# Patient Record
Sex: Male | Born: 1935 | Race: White | Hispanic: No | Marital: Married | Smoking: Former smoker
Health system: Southern US, Community
[De-identification: ages and names within clinical notes are randomized; demographics above are authoritative.]

## PROBLEM LIST (undated history)

## (undated) DIAGNOSIS — I2510 Atherosclerotic heart disease of native coronary artery without angina pectoris: Secondary | ICD-10-CM

## (undated) DIAGNOSIS — F329 Major depressive disorder, single episode, unspecified: Secondary | ICD-10-CM

## (undated) DIAGNOSIS — Z9989 Dependence on other enabling machines and devices: Secondary | ICD-10-CM

## (undated) DIAGNOSIS — M1990 Unspecified osteoarthritis, unspecified site: Secondary | ICD-10-CM

## (undated) DIAGNOSIS — F32A Depression, unspecified: Secondary | ICD-10-CM

## (undated) DIAGNOSIS — J189 Pneumonia, unspecified organism: Secondary | ICD-10-CM

## (undated) DIAGNOSIS — I4892 Unspecified atrial flutter: Secondary | ICD-10-CM

## (undated) DIAGNOSIS — Z9289 Personal history of other medical treatment: Secondary | ICD-10-CM

## (undated) DIAGNOSIS — C679 Malignant neoplasm of bladder, unspecified: Secondary | ICD-10-CM

## (undated) DIAGNOSIS — E785 Hyperlipidemia, unspecified: Secondary | ICD-10-CM

## (undated) DIAGNOSIS — G4733 Obstructive sleep apnea (adult) (pediatric): Secondary | ICD-10-CM

## (undated) HISTORY — DX: Malignant neoplasm of bladder, unspecified: C67.9

## (undated) HISTORY — DX: Hyperlipidemia, unspecified: E78.5

## (undated) HISTORY — DX: Depression, unspecified: F32.A

## (undated) HISTORY — DX: Major depressive disorder, single episode, unspecified: F32.9

## (undated) HISTORY — PX: CARPAL TUNNEL RELEASE: SHX101

## (undated) HISTORY — DX: Unspecified atrial flutter: I48.92

## (undated) HISTORY — DX: Atherosclerotic heart disease of native coronary artery without angina pectoris: I25.10

## (undated) HISTORY — PX: TRANSURETHRAL RESECTION OF BLADDER TUMOR: SHX2575

---
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
## 1998-05-25 ENCOUNTER — Ambulatory Visit (HOSPITAL_BASED_OUTPATIENT_CLINIC_OR_DEPARTMENT_OTHER): Admission: RE | Admit: 1998-05-25 | Discharge: 1998-05-25 | Payer: Self-pay | Admitting: Urology

## 1999-03-12 ENCOUNTER — Ambulatory Visit (HOSPITAL_BASED_OUTPATIENT_CLINIC_OR_DEPARTMENT_OTHER): Admission: RE | Admit: 1999-03-12 | Discharge: 1999-03-12 | Payer: Self-pay | Admitting: Urology

## 2000-04-01 ENCOUNTER — Encounter (INDEPENDENT_AMBULATORY_CARE_PROVIDER_SITE_OTHER): Payer: Self-pay | Admitting: Specialist

## 2000-04-01 ENCOUNTER — Ambulatory Visit (HOSPITAL_COMMUNITY): Admission: RE | Admit: 2000-04-01 | Discharge: 2000-04-01 | Payer: Self-pay | Admitting: Urology

## 2001-09-30 ENCOUNTER — Ambulatory Visit (HOSPITAL_COMMUNITY): Admission: RE | Admit: 2001-09-30 | Discharge: 2001-09-30 | Payer: Self-pay | Admitting: *Deleted

## 2001-09-30 ENCOUNTER — Encounter (INDEPENDENT_AMBULATORY_CARE_PROVIDER_SITE_OTHER): Payer: Self-pay | Admitting: Specialist

## 2002-01-10 ENCOUNTER — Ambulatory Visit (HOSPITAL_COMMUNITY): Admission: RE | Admit: 2002-01-10 | Discharge: 2002-01-10 | Payer: Self-pay | Admitting: *Deleted

## 2002-07-14 ENCOUNTER — Encounter: Payer: Self-pay | Admitting: Urology

## 2002-07-19 ENCOUNTER — Encounter (INDEPENDENT_AMBULATORY_CARE_PROVIDER_SITE_OTHER): Payer: Self-pay

## 2002-07-19 ENCOUNTER — Observation Stay (HOSPITAL_COMMUNITY): Admission: RE | Admit: 2002-07-19 | Discharge: 2002-07-20 | Payer: Self-pay | Admitting: Urology

## 2002-08-05 ENCOUNTER — Encounter (INDEPENDENT_AMBULATORY_CARE_PROVIDER_SITE_OTHER): Payer: Self-pay | Admitting: Specialist

## 2002-08-05 ENCOUNTER — Encounter: Payer: Self-pay | Admitting: Urology

## 2002-08-05 ENCOUNTER — Ambulatory Visit (HOSPITAL_COMMUNITY): Admission: RE | Admit: 2002-08-05 | Discharge: 2002-08-05 | Payer: Self-pay | Admitting: Urology

## 2002-11-15 ENCOUNTER — Encounter (INDEPENDENT_AMBULATORY_CARE_PROVIDER_SITE_OTHER): Payer: Self-pay

## 2002-11-15 ENCOUNTER — Ambulatory Visit (HOSPITAL_BASED_OUTPATIENT_CLINIC_OR_DEPARTMENT_OTHER): Admission: RE | Admit: 2002-11-15 | Discharge: 2002-11-15 | Payer: Self-pay | Admitting: Urology

## 2003-01-15 ENCOUNTER — Emergency Department (HOSPITAL_COMMUNITY): Admission: EM | Admit: 2003-01-15 | Discharge: 2003-01-15 | Payer: Self-pay | Admitting: Emergency Medicine

## 2003-01-15 ENCOUNTER — Encounter: Payer: Self-pay | Admitting: Emergency Medicine

## 2003-07-18 ENCOUNTER — Ambulatory Visit (HOSPITAL_COMMUNITY): Admission: RE | Admit: 2003-07-18 | Discharge: 2003-07-18 | Payer: Self-pay | Admitting: Urology

## 2003-07-18 ENCOUNTER — Ambulatory Visit (HOSPITAL_BASED_OUTPATIENT_CLINIC_OR_DEPARTMENT_OTHER): Admission: RE | Admit: 2003-07-18 | Discharge: 2003-07-18 | Payer: Self-pay | Admitting: Urology

## 2003-07-18 ENCOUNTER — Encounter (INDEPENDENT_AMBULATORY_CARE_PROVIDER_SITE_OTHER): Payer: Self-pay

## 2003-10-25 ENCOUNTER — Encounter (INDEPENDENT_AMBULATORY_CARE_PROVIDER_SITE_OTHER): Payer: Self-pay | Admitting: Specialist

## 2003-10-25 ENCOUNTER — Ambulatory Visit (HOSPITAL_BASED_OUTPATIENT_CLINIC_OR_DEPARTMENT_OTHER): Admission: RE | Admit: 2003-10-25 | Discharge: 2003-10-25 | Payer: Self-pay | Admitting: Urology

## 2003-10-25 ENCOUNTER — Ambulatory Visit (HOSPITAL_COMMUNITY): Admission: RE | Admit: 2003-10-25 | Discharge: 2003-10-25 | Payer: Self-pay | Admitting: Urology

## 2004-07-31 ENCOUNTER — Ambulatory Visit (HOSPITAL_BASED_OUTPATIENT_CLINIC_OR_DEPARTMENT_OTHER): Admission: RE | Admit: 2004-07-31 | Discharge: 2004-07-31 | Payer: Self-pay | Admitting: Urology

## 2006-01-09 ENCOUNTER — Ambulatory Visit (HOSPITAL_BASED_OUTPATIENT_CLINIC_OR_DEPARTMENT_OTHER): Admission: RE | Admit: 2006-01-09 | Discharge: 2006-01-09 | Payer: Self-pay | Admitting: Urology

## 2007-01-22 ENCOUNTER — Encounter (INDEPENDENT_AMBULATORY_CARE_PROVIDER_SITE_OTHER): Payer: Self-pay | Admitting: *Deleted

## 2007-01-22 ENCOUNTER — Ambulatory Visit (HOSPITAL_COMMUNITY): Admission: RE | Admit: 2007-01-22 | Discharge: 2007-01-22 | Payer: Self-pay | Admitting: *Deleted

## 2009-03-31 ENCOUNTER — Emergency Department (HOSPITAL_COMMUNITY): Admission: EM | Admit: 2009-03-31 | Discharge: 2009-03-31 | Payer: Self-pay | Admitting: Emergency Medicine

## 2009-05-09 ENCOUNTER — Ambulatory Visit (HOSPITAL_COMMUNITY): Admission: RE | Admit: 2009-05-09 | Discharge: 2009-05-09 | Payer: Self-pay | Admitting: Cardiovascular Disease

## 2009-05-11 DIAGNOSIS — I4892 Unspecified atrial flutter: Secondary | ICD-10-CM

## 2009-05-11 HISTORY — PX: CARDIOVERSION: SHX1299

## 2009-05-11 HISTORY — DX: Unspecified atrial flutter: I48.92

## 2010-02-15 ENCOUNTER — Encounter: Admission: RE | Admit: 2010-02-15 | Discharge: 2010-02-15 | Payer: Self-pay | Admitting: Emergency Medicine

## 2010-03-05 ENCOUNTER — Other Ambulatory Visit: Admission: RE | Admit: 2010-03-05 | Discharge: 2010-03-05 | Payer: Self-pay | Admitting: Interventional Radiology

## 2010-03-05 ENCOUNTER — Encounter: Admission: RE | Admit: 2010-03-05 | Discharge: 2010-03-05 | Payer: Self-pay | Admitting: Emergency Medicine

## 2010-06-26 ENCOUNTER — Ambulatory Visit: Payer: Self-pay | Admitting: Cardiovascular Disease

## 2010-08-11 HISTORY — PX: ATRIAL FLUTTER ABLATION: SHX5733

## 2010-10-31 ENCOUNTER — Other Ambulatory Visit: Payer: Self-pay | Admitting: Dermatology

## 2010-11-16 LAB — APTT: aPTT: 28 seconds (ref 24–37)

## 2010-11-16 LAB — DIFFERENTIAL
Lymphocytes Relative: 19 % (ref 12–46)
Lymphs Abs: 1.6 10*3/uL (ref 0.7–4.0)
Monocytes Relative: 11 % (ref 3–12)
Neutro Abs: 5.6 10*3/uL (ref 1.7–7.7)
Neutrophils Relative %: 68 % (ref 43–77)

## 2010-11-16 LAB — COMPREHENSIVE METABOLIC PANEL
BUN: 14 mg/dL (ref 6–23)
CO2: 25 mEq/L (ref 19–32)
Calcium: 8.7 mg/dL (ref 8.4–10.5)
Creatinine, Ser: 1 mg/dL (ref 0.4–1.5)
GFR calc non Af Amer: >60 mL/min (ref >60)
Glucose, Bld: 115 mg/dL — ABNORMAL HIGH (ref 70–99)
Sodium: 136 mEq/L (ref 135–145)
Total Protein: 7.4 g/dL (ref 6.0–8.3)

## 2010-11-16 LAB — CBC
HCT: 43.9 % (ref 39.0–52.0)
Hemoglobin: 15 g/dL (ref 13.0–17.0)
MCHC: 34.2 g/dL (ref 30.0–36.0)
MCV: 93.7 fL (ref 78.0–100.0)
RDW: 14.3 % (ref 11.5–15.5)

## 2010-11-16 LAB — PROTIME-INR
INR: 1 (ref 0.00–1.49)
Prothrombin Time: 13 seconds (ref 11.6–15.2)

## 2010-11-16 LAB — POCT CARDIAC MARKERS
CKMB, poc: 1.1 ng/mL (ref 1.0–8.0)
Myoglobin, poc: 121 ng/mL (ref 12–200)

## 2010-11-29 ENCOUNTER — Encounter: Payer: Self-pay | Admitting: Orthopedic Surgery

## 2010-12-06 ENCOUNTER — Encounter: Payer: Self-pay | Admitting: Cardiovascular Disease

## 2010-12-06 ENCOUNTER — Encounter: Payer: Self-pay | Admitting: *Deleted

## 2010-12-06 DIAGNOSIS — G4730 Sleep apnea, unspecified: Secondary | ICD-10-CM | POA: Insufficient documentation

## 2010-12-06 DIAGNOSIS — F329 Major depressive disorder, single episode, unspecified: Secondary | ICD-10-CM | POA: Insufficient documentation

## 2010-12-06 DIAGNOSIS — I4892 Unspecified atrial flutter: Secondary | ICD-10-CM | POA: Insufficient documentation

## 2010-12-06 DIAGNOSIS — C679 Malignant neoplasm of bladder, unspecified: Secondary | ICD-10-CM | POA: Insufficient documentation

## 2010-12-12 ENCOUNTER — Ambulatory Visit (INDEPENDENT_AMBULATORY_CARE_PROVIDER_SITE_OTHER): Payer: Medicare Other | Admitting: Cardiovascular Disease

## 2010-12-12 ENCOUNTER — Encounter: Payer: Self-pay | Admitting: Cardiovascular Disease

## 2010-12-12 VITALS — BP 102/62 | HR 74 | Ht 69.0 in | Wt 224.0 lb

## 2010-12-12 DIAGNOSIS — I4892 Unspecified atrial flutter: Secondary | ICD-10-CM

## 2010-12-12 MED ORDER — RIVAROXABAN 20 MG PO TABS: 20.0000 mg | ORAL_TABLET | Freq: Every day | ORAL | Status: DC

## 2010-12-12 NOTE — Assessment & Plan Note (Signed)
James [name] presents with atrial flutter with a controlled ventricular response. I've offered to send him to one of the electrophysiologists for atrial flutter ablation. We'll start him on Xarelto 20 mg a day. I anticipate that he will need a transesophageal echo prior to his atrial flutter ablation. I'll see him back in the office in 3 months.

## 2010-12-12 NOTE — Progress Notes (Signed)
James [name] Date of Birth  02-25-1936 [facility] [address]     [address] [place], [place]  [address] [phone]  Fax  [phone]  History of Present Illness:  Hx of A-Flutter - was cardioverted in the past. Had recent Left wrist surgery and he was found to be in A-Flutter during the surgery. No dyspnea, no syncope, no chest pain.     Current Outpatient Prescriptions on File Prior to Visit  Medication Sig Dispense Refill  . aspirin 81 MG tablet Take 81 mg by mouth daily.        . fish oil-omega-3 fatty acids 1000 MG capsule Take by mouth daily.        . Flaxseed, Linseed, (FLAX SEEDS PO) Take by mouth daily.        . magnesium oxide (MAG-OX) 400 MG tablet Take 50 mg by mouth daily.        . metoprolol succinate (TOPROL-XL) 25 MG 24 hr tablet Take 12.5 mg by mouth daily.        . Multiple Vitamin (MULTIVITAMIN) tablet Take 1 tablet by mouth daily.        . Niacin (VITAMIN B-3 PO) Take by mouth daily.        . sertraline (ZOLOFT) 100 MG tablet Take 100 mg by mouth daily.        Marland Kitchen BILBERRY, VACCINIUM MYRTILLUS, PO Take by mouth daily.          Allergies  Allergen Reactions  . Flomax (Tamsulosin Hcl)   . Protonix     Past Medical History  Diagnosis Date  . Atrial flutter 05/2009    STATUS POST CARDIOVERSION   . Bladder cancer   . Sleep apnea   . Depression     History reviewed. No pertinent past surgical history.  History  Smoking status  . Former Smoker  . Quit date: 08/11/1981  Smokeless tobacco  . Not on file    History  Alcohol Use No    History reviewed. No pertinent family history.  Reviw of Systems:  Reviewed in the HPI.  All other systems are negative.  Physical Exam: BP 102/62  Pulse 74  Ht 5\' 9"  (1.753 m)  Wt 224 lb (101.606 kg)  BMI 33.08 kg/m2 The patient is alert and oriented x 3.  The mood and affect are normal.  The skin is warm and dry.  Color is normal.  The HEENT exam reveals that the  sclera are nonicteric.  The mucous membranes are moist.  The carotids are 2+ without bruits.  There is no thyromegaly.  There is no JVD.  The lungs are clear.  The chest wall is non tender.  The heart exam reveals a regular rate with a normal S1 and S2.  There are no murmurs, gallops, or rubs.  The PMI is not displaced.   Abdominal exam reveals good bowel sounds.  There is no guarding or rebound.  There is no hepatosplenomegaly or tenderness.  There are no masses.  Exam of the legs reveal no clubbing, cyanosis, or edema.  The legs are without rashes.  The distal pulses are intact.  Cranial nerves II - XII are intact.  Motor and sensory functions are intact.  The gait is normal.  ECG: Atrial flutter with a controlled ventricular response at 75 beats per minute  Assessment / Plan:

## 2010-12-23 ENCOUNTER — Encounter: Payer: Self-pay | Admitting: *Deleted

## 2010-12-24 NOTE — Op Note (Signed)
[name], James NO.:  [id number]   MEDICAL RECORD NO.:  [id number]          PATIENT TYPE:  AMB   LOCATION:  ENDO                         FACILITY:  [facility]   PHYSICIAN:  Georgiana Spinner, M.D.    DATE OF BIRTH:  1936-03-25   DATE OF PROCEDURE:  01/22/2007  DATE OF DISCHARGE:                               OPERATIVE REPORT   PROCEDURE:  Colonoscopy.   INDICATIONS:  Colon polyps removed in the past.   ANESTHESIA:  Fentanyl 30 mcg, Versed 3 mg.   PROCEDURE:  With the patient mildly sedated in the left lateral  decubitus position, a rectal examination was performed which was  unremarkable to my exam.  Subsequently the Pentax videoscopic  colonoscope was inserted in the rectum, passed through diverticular  filled sigmoid colon and with pressure applied.  The patient rolled to  his back and subsequently to his right side and back to his back.  We  were able to reach the cecum as identified by ileocecal valve, base of  the cecum and appendiceal orifice, all of which were photographed.  After clearing the cecum, the colonoscope was slowly withdrawn taking  circumferential views of colonic mucosa stopping only to photograph  diverticula seen along the way, both in the right and left colon  moderately severe until we reached the rectum where two small polyps  were seen on direct view.  There were removed using hot biopsy forceps  technique setting of 20/200 blended current.  We then placed the  colonoscope on retroflex view to view the anal canal from above, the  endoscope was straightened and withdrawn.  The patient's vital signs,  pulse oximeter remained stable.  The patient tolerated procedure well  without apparent complications.   FINDINGS:  Two small polyps of rectum, diverticulosis, moderately severe  scattered throughout the right and left colon.  Internal hemorrhoids.   PLAN:  Await biopsy report.  The patient will call me for results and  follow-up with me  as needed as an outpatient.           ______________________________  Georgiana Spinner, M.D.     GMO/MEDQ  D:  01/22/2007  T:  01/22/2007  Job:  [id number]

## 2010-12-24 NOTE — Op Note (Signed)
NAMELAYDON, MARTIS NO.:  [id number]   MEDICAL RECORD NO.:  [id number]          PATIENT TYPE:  AMB   LOCATION:  ENDO                         FACILITY:  [facility]   PHYSICIAN:  Georgiana Spinner, M.D.    DATE OF BIRTH:  03-16-1936   DATE OF PROCEDURE:  01/22/2007  DATE OF DISCHARGE:                               OPERATIVE REPORT   PROCEDURE:  Upper endoscopy.   INDICATIONS:  GERD, Rule out Barrett's esophagus.   ANESTHESIA:  Fentanyl 50 mcg, Versed 7 mg.   PROCEDURE:  With the patient mildly sedated in the left lateral  decubitus position, the Pentax videoscopic endoscope was inserted in the  mouth, passed under direct vision through the esophagus, which appeared  normal, until we reached distal esophagus and we got a good view of the  squamocolumnar junction and I could see no clear-cut evidence of  Barrett's esophagus; however, this area was somewhat erythematous  consistent with a localized esophagitis, which I photographed and  biopsied.  We entered into the stomach.  Fundus, body, antrum, duodenal  bulb, second portion of the duodenum all appeared normal.  From this  point the endoscope was slowly withdrawn taking circumferential views of  the duodenal mucosa until the endoscope was pulled back into the  stomach, placed in retroflexion to view the stomach from below.  The  endoscope was straightened and withdrawn taking circumferential views of  the remaining gastric and esophageal mucosa.  The patient's vital signs  and pulse oximeter remained stable.  The patient tolerated the procedure  well without apparent complications.   FINDINGS:  Localized esophagitis near the squamocolumnar junction,  biopsied and photographed.   Await biopsy report.  The patient will call me for results and follow up  with me as needed as an outpatient.  Proceed to colonoscopy.           ______________________________  Georgiana Spinner, M.D.     GMO/MEDQ  D:  01/22/2007  T:   01/22/2007  Job:  [id number]

## 2010-12-25 ENCOUNTER — Ambulatory Visit (INDEPENDENT_AMBULATORY_CARE_PROVIDER_SITE_OTHER): Payer: Medicare Other | Admitting: Internal Medicine

## 2010-12-25 ENCOUNTER — Encounter: Payer: Self-pay | Admitting: *Deleted

## 2010-12-25 ENCOUNTER — Encounter: Payer: Self-pay | Admitting: Internal Medicine

## 2010-12-25 VITALS — BP 102/64 | HR 76 | Resp 14 | Ht 69.0 in | Wt 222.0 lb

## 2010-12-25 DIAGNOSIS — I4892 Unspecified atrial flutter: Secondary | ICD-10-CM

## 2010-12-25 NOTE — Patient Instructions (Addendum)
Your physician has recommended that you have an ablation. Catheter ablation is a medical procedure used to treat some cardiac arrhythmias (irregular heartbeats). During catheter ablation, a long, thin, flexible tube is put into a blood vessel in your groin (upper thigh), or neck. This tube is called an ablation catheter. It is then guided to your heart through the blood vessel. Radio frequency waves destroy small areas of heart tissue where abnormal heartbeats may cause an arrhythmia to start. Please see the instruction sheet given to you today.  Your physician has requested that you have an echocardiogram. Echocardiography is a painless test that uses sound waves to create images of your heart. It provides your doctor with information about the size and shape of your heart and how well your heart's chambers and valves are working. This procedure takes approximately one hour. There are no restrictions for this procedure.  Your physician recommends that you return for lab work on 01/13/11: bmp/cbc/pt (427.32)  Your physician has recommended you make the following change in your medication: Stop Aspirin.

## 2010-12-25 NOTE — Assessment & Plan Note (Signed)
The patient has atrial flutter both typical as well as atypical likely representing reverse typical. We have discussed potential risks and benefits and alternatives to catheter ablation. His CHADS VASC score is one and so he would not need anticoagulation until he were 77 and the absence of a anticipated procedure. He was like to proceed with catheter ablation to reduce his risk of stroke and improve his fatigue. We have discussed potential risks and benefits.  We will plan to undertake an echo to look at left ventricular function and valvular structures. He is now 10 days into his anticoagulation and will anticipate his ablation in about 2 weeks' time.  We will discontinue his aspirin at this time as we don't know whether it adds or detracts from benefits of James [name]

## 2010-12-25 NOTE — Progress Notes (Signed)
History of present illness  James [name] is seen at the request of [name] because of recurrent atrial flutter.  He is a 75 year old who is largely healthy. He ended up being found to have atrial flutter 2 years ago. We'll echocardiographic review demonstrated typical atrial flutter and he underwent cardioversion.  He went to the wrist surgery earlier this spring. He was found to be in atrial flutter. Heart the cardiogram demonstrated atypical flutter. He was referred to [name]. Pradaxa was initiated and he is referred for consideration of ablation. : in the interim his flutter had become typical.  Thromboembolic risk factors are notable for age-[age]; his CHADS VASC score is one.  Following his cardioversion 2 years ago he noted improvement in fatigue and exercise tolerance. He has had no other significant symptoms that he can attribute to the presence of the atrial flutter.  Cardiac evaluation has not been performed recently.     Past Medical History  Diagnosis Date  . Atrial flutter 05/2009    STATUS POST CARDIOVERSION   . Bladder cancer   . Sleep apnea   . Depression     Past Surgical History  Procedure Date  . Carpal tunnel release   . Bladder surgery     Current Outpatient Prescriptions  Medication Sig Dispense Refill  . BILBERRY, VACCINIUM MYRTILLUS, PO Take by mouth daily.        Marland Kitchen CINNAMON PO Take by mouth daily.        . fish oil-omega-3 fatty acids 1000 MG capsule Take by mouth daily.        . Flaxseed, Linseed, (FLAX SEEDS PO) Take by mouth daily.        . magnesium oxide (MAG-OX) 400 MG tablet Take 50 mg by mouth daily.        . metoprolol succinate (TOPROL-XL) 25 MG 24 hr tablet Take 12.5 mg by mouth daily.        . Multiple Vitamin (MULTIVITAMIN) tablet Take 1 tablet by mouth daily.        . NON FORMULARY Vitamin B and C  1 tab po qd       . Rivaroxaban (XARELTO) 20 MG TABS Take 20 mg by mouth daily.  30 tablet  12  . sertraline (ZOLOFT) 100 MG tablet Take  100 mg by mouth daily.        . Turmeric POWD by Does not apply route daily.        Marland Kitchen DISCONTD: aspirin 81 MG tablet Take 81 mg by mouth daily.        Marland Kitchen DISCONTD: Niacin (VITAMIN B-3 PO) Take by mouth daily.          Allergies  Allergen Reactions  . Flomax (Tamsulosin Hcl)   . Protonix     Review of Systems negative except from HPI and PMH involvement in Alcoholics Anonymous, obesity and overeaters anonymous for which he has lost 30-35 pounds constitutional symptoms are negative.  Social history he is married with children and grandchildren and has a 32 year old adopted [place] daughter. He does not use cigarettes. He does not use alcohol or recreational drugs. He walks some exercise. 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  Physical Exam Well developed and well nourished Caucasian male appearing his stated age in no acute distress HENT normal E scleral and icterus clear Neck Supple JVP flat; carotids brisk and full Clear to ausculation Regular rate and rhythm, no murmurs gallops or rub Soft with active bowel sounds No  clubbing cyanosis and edema Alert and oriented, grossly normal motor and sensory function Skin Warm and Dry  ECG typical atrial flutter at a rate of 74.  Review cardiograms throughout the medical record demonstrates atypical atrial flutter with a similar atrial cycle length was upright flutter waves in the inferior leads and negative for release in lead V1 Assessment and  Plan

## 2010-12-27 NOTE — Procedures (Signed)
[facility]  Patient:    MUMIN, DENOMME Visit Number: [id number] MRN: [id number]          Service Type: END Location: ENDO Attending Physician:  Sabino Gasser Dictated by:   Sabino Gasser, M.D. Proc. Date: 01/10/02 Admit Date:  01/10/2002 Discharge Date: 01/10/2002                             Procedure Report  PROCEDURE:  Upper endoscopy.  INDICATION:  Follow-up of gastric ulcer with bleeding.  ANESTHESIA:  Demerol 50, Versed 5 mg.  DESCRIPTION OF PROCEDURE:  With the patient mildly sedated in the left lateral decubitus position, the Olympus videoscopic endoscope was inserted into the mouth and passed under direct vision through the esophagus, which appeared normal, into the stomach.  The fundus, body, antrum, duodenal bulb, and second portion of duodenum all appeared normal.  From this point, the endoscope was slowly withdrawn, taking circumferential views of the entire duodenal mucosa until the endoscope then pulled back into the stomach and placed in retroflexion to view the stomach from below.  The endoscope was then straightened and withdrawn, taking circumferential views of the entire gastric and esophageal mucosa.  The patients vital signs and pulse oximeter remained stable.  The patient tolerated the procedure well without apparent complications.  FINDINGS: 1. Some fluid retained in stomach. 2. Otherwise an unremarkable examination. 3. No evidence of Barretts.  No recurrence of ulcer.  PLAN:  Continue proton pump inhibitor as long as patient is on an NSAID. Dictated by:   Sabino Gasser, M.D. Attending Physician:  Sabino Gasser DD:  01/10/02 TD:  01/10/02 Job: [id number]

## 2010-12-27 NOTE — Op Note (Signed)
[facility]  Patient:    James [name], James [name]                         MRN: [id number] Proc. Date: 04/01/00 Adm. Date:  [id number] Attending:  Nelma Rothman Iii                           Operative Report  PREOPERATIVE DIAGNOSIS:  Bladder lesions.  OPERATIVE PROCEDURE:  Cystourethroscopy and cold cup removal of two bladder lesions.  SURGEON:  Lucrezia Starch. Ovidio Hanger, M.D.  ANESTHESIA:  General endotracheal.  ESTIMATED BLOOD LOSS:  Negligible.  TUBES:  None.  COMPLICATIONS:  None.  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 He has had intravesical immunotherapy with BCG and a most recent biopsy approximately a year previously with inflammation only.  At follow-up cystourethroscopy at the office, he was found to have a wide caliber deep bulbar urethral stricture but was found to have a ______  lesion in the posterior midline near the trigone and ______  lesion at the opening of the left ureteral orifice.  He understands the risks, benefits, and alternatives. He has elected to proceed with cystourethroscopy, cold cup removal of the lesions.  DESCRIPTION OF PROCEDURE:  The patient was placed in the supine position after proper General endotracheal anesthesia and was placed in the dorsal lithotomy position and prepped and draped with Betadine in a sterile fashion. Cystourethroscopy was performed with a 22.5 [place] ACMI panendoscope utilizing 30 and 70 degree lenses.  The bladder was carefully inspected, and a tiny 2 mm lesion in the posterior midline just proximal to the trigone was identified and removed in its entirety with cold cup biopsy forceps and submitted to pathology, and the base was cauterized with Bovie coagulation cautery.  A 4 French whistle-tip catheter was placed in the left ureteral orifice and tiny areas in the 10 oclock and 11  oclock position at the external orifice were noted and were removed with cold cup biopsy forceps, and point coagulation was performed carefully avoiding the ureteral orifice.  No other lesions were noted to be present.  The bladder was drained.  The panendoscope was removed. The specimens were submitted separately to pathology, and the patient was taken to the recovery room stable.  PLAN:  If the biopsies are positive, follow him up and consider another intravesical immunotherapy course and probably to perform an intravenous pyelogram should the biopsy be positive. DD:  04/01/00 TD:  04/02/00 Job: [id number]

## 2010-12-27 NOTE — Op Note (Signed)
NAME:  James [name], James [name]                            ACCOUNT NO.:  [id number]   MEDICAL RECORD NO.:  [id number]                   PATIENT TYPE:  AMB   LOCATION:  [facility]                                 FACILITY:  [facility]   PHYSICIAN:  Ronald L. Ovidio Hanger, M.D.           DATE OF BIRTH:  [birth year]   DATE OF PROCEDURE:  11/15/2002  DATE OF DISCHARGE:                                 OPERATIVE REPORT   DIAGNOSES:  1. History of transitional cell carcinoma of the bladder.  2. Left ureteral lesion, which was hyperplasia on biopsy.  3. Status post bacille Calmette-Guerin with left ureteral stent in place.   OPERATIVE PROCEDURE:  1. Cystourethroscopy.  2. Ureteroscopy.  3. Left ureteroscopy.  4. Brush biopsy of lower ureter.  5. Replacement of left double-J stent.   SURGEON:  [name], M.D.   ANESTHESIA:  General laryngeal airway.   ESTIMATED BLOOD LOSS:  Negligible.   TUBES:  A 24 cm 7 French Bard double-pigtail stent.   COMPLICATIONS:  None.   INDICATIONS FOR PROCEDURE:  Mr. Fedewa is a very nice 75 year old, white male  with a history of superficial transitional cell carcinoma of the bladder.  He has undergone treatment with intravesical chemotherapy several times  previously and at biopsy was found to have a lower ureteral lesion that was  suspicious for transitional cell carcinoma.  However, on two separate  biopsies, was found to have hyperplasia only.  We were contemplating a left  nephroureterectomy but with the diagnosis of hyperplasia, it was elected  with two separate biopsies to place a left double-J stent and treat him with  BCG intravesically, hoping that it would reflux in the area and would treat  it and preserve the left renal unit and ureter.  After understanding risks.  benefits, and alternatives, he underwent this and now is for follow-up  ureteroscopy, cystoscopy, and biopsy.   PROCEDURE IN DETAIL:  The patient was placed in a supine position.  After  proper  general laryngeal airway anesthesia, was placed in the dorsal  lithotomy position and prepped and draped with Betadine in sterile fashion.  Cystourethroscopy was performed with the 22.5 French Olympus panendoscope,  utilizing the 12 and 70 degree lenses.  The bladder was carefully inspected  and really noted to be without lesions.  Efflux of clear urine was noted  from the normally-placed right ureteral orifice.  The stent was on the left  side, and there some inflammation around the stent, but it appeared really  to be benign inflammation and was very minute.  The stent was grasped with a  grasping forcep, pulled to the meatus and under fluoroscopic guidance, a  .038 French Teflon-coated guidewire was placed into the left kidney, and the  old stent was removed.  Utilizing the short, thin ACMI ureteroscope, the  lower half of the ureter was examined.  There was some inflammation noted in  the lower ureter, but none of the voluminous tissue that was seen previously  appeared to be present.  A brush was visually brushed in the entire lower  third ureter and submitted for cytological evaluation.  Reinspection  revealed again no significant lesions, just some inflammation which may  simply have been stent-generated, and we were quite pleased at the way it  appeared.  Under fluoroscopic guidance, a 24 cm 7 French Bard double-pigtail  stent was placed and noted to be in good position within the left renal  pelvis and within the bladder.  The bladder was drained.  No pull-out string  was attached.  The panendoscope was visually removed, and the patient was  taken to the recovery room stable.  Our plan is to await the biopsies.  If  they show no tumor, we will probably remove the stent in our office at that  time.                                               Ronald L. Ovidio Hanger, M.D.    RLD/MEDQ  D:  11/15/2002  T:  11/15/2002  Job:  [id number]

## 2010-12-27 NOTE — Op Note (Signed)
NAME:  James [name], James [name]                            ACCOUNT NO.:  [id number]   MEDICAL RECORD NO.:  [id number]                   PATIENT TYPE:  AMB   LOCATION:  [facility]                                 FACILITY:  [facility]   PHYSICIAN:  Ronald L. Ovidio Hanger, M.D.           DATE OF BIRTH:  1936-07-03   DATE OF PROCEDURE:  10/25/2003  DATE OF DISCHARGE:                                 OPERATIVE REPORT   DIAGNOSES:  History of transitional cell carcinoma of the bladder and  hyperplasia of the left lower ureter.   OPERATIVE PROCEDURE:  1. Cystourethroscopy.  2. Bladder biopsy.  3. Left ureteroscopy.   SURGEON:  Gaynelle Arabian, M.D.   ANESTHESIA:  LMA.   BLOOD LOSS:  Negligible.   TUBES:  None.   COMPLICATIONS:  None.   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  He has undergone BCG and Intron A therapies.  He has also had what  was felt to be hyperplasia of low malignant potential in the left lower  ureter and has had one course of BCG, Intron A with a ureteral stent in  place.  He comes in basically for a follow-up biopsy.  He has had no  hematuria or other problems.   PROCEDURE IN DETAIL:  The patient was placed in a supine position.  After  proper LMA anesthesia, was placed in the dorsal lithotomy position and  prepped and draped with Betadine in a sterile fashion.  Cystourethroscopy  was performed with a 22.5 French Olympus panendoscope utilizing the 12 and  70 degree lenses.  The bladder was carefully inspected.  Efflux of clear  urine was noted from the normally placed ureteral orifices bilaterally.  There was mild trilobar hypertrophy, fairly high bladder neck, and really no  lesions noted.  There was slight hyperemia just lateral and slightly  superior to the left ureteral orifice.  Utilizing cold cup forceps, this  area was biopsied and submitted to pathology, and the base  was cauterized  with Bugbee coagulation cautery.  A 0.038 French Teflon-coated guidewire was  placed into the left ureter up to the left renal pelvis, and ureteroscopy  was performed with a Wolf short thin ureteroscope.  The lower two-thirds  ureter was examined and noted to be perfectly clear.  Previous areas of  hyperplasia were noted to have smooth normal-appearing mucosa, and there was  absolutely no area to biopsy.  The ureteroscope was visually removed.  The  bladder was drained.  The guidewire was removed, and the patient was taken  to the recovery room stable.  Ronald L. Ovidio Hanger, M.D.   RLD/MEDQ  D:  10/25/2003  T:  10/25/2003  Job:  [id number]

## 2010-12-27 NOTE — Procedures (Signed)
[facility]  Patient:    James [name], James [name] Visit Number: [id number] MRN: [id number]          Service Type: Attending:  Sabino Gasser, M.D. Dictated by:   Sabino Gasser, M.D. Proc. Date: 09/30/01                             Procedure Report  PROCEDURE:  Upper endoscopy.  INDICATION:  Hemoccult positivity.  ANESTHESIA:  Demerol 70, Versed 7 mg.  DESCRIPTION OF PROCEDURE:  With patient mildly sedated in the left lateral decubitus position, the Olympus videoscopic endoscope was inserted into the mouth and passed under direct vision through the esophagus.  The distal esophagus showed questionable Barretts esophagus versus a flame of just esophagitis.  This was photographed and biopsied.  We entered into the stomach.  The fundus, body, antrum, duodenal bulb, and second portion of duodenum were visualized.  From this point, the endoscope was slowly withdrawn, taking circumferential views of the entire duodenal mucosa until the endoscope then pulled back into the stomach and placed in retroflexion to view the stomach from below.  The endoscope was then straightened and withdrawn, taking circumferential views of the remaining gastric and esophageal mucosa, stopping in the body of the stomach where ulcerations were seen with an eschar on top, indicating that it had bled.  Surrounding tissue was erythematous consistent with a possible gastritis.  This was photographed and biopsied.  The patients vital signs and pulse oximeter remained stable. The patient tolerated the procedure well without apparent complications.  FINDINGS:  Changes of gastritis with ulceration with eschar indicating bleeding.  Additionally, there were some changes of the distal esophagus, biopsies taken.  PLAN:  Await biopsy report.  The patient will call me for results and follow up with me as an outpatient.  Proceed to colonoscopy. Dictated by:   Sabino Gasser, M.D. Attending:  Sabino Gasser,  M.D. DD:  09/30/01 TD:  09/30/01 Job: [id number]

## 2010-12-27 NOTE — Op Note (Signed)
NAMELADON, James [name] NO.:  [id number]   MEDICAL RECORD NO.:  [id number]          PATIENT TYPE:  AMB   LOCATION:  [facility]                         FACILITY:  [facility]   PHYSICIAN:  [name], M.D.  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   OPERATION PERFORMED:  Cystourethroscopy and ureteroscopy.   SURGEON:  [name], M.D.   ANESTHESIA:  LMA.   ESTIMATED BLOOD LOSS:  Negligible.   TUBES:  None.   COMPLICATIONS:  None.   INDICATIONS FOR PROCEDURE:  Joie is a very nice 75 year old white male who  has had recurrent transitional cell carcinoma of the bladder.  He also had  tumor in the lower very distal ureter that had low malignant potential.  He  was treated with a stent, BCG in the past and has done well.  He had  negative cystourethroscopy but comes in for lower ureteroscopy to make sure  there is no recurrence at that point.  After understanding risks, benefits  and alternatives, he has elected to proceed.   DESCRIPTION OF PROCEDURE:  The patient was placed in supine position and  after proper LMA anesthesia, was placed in dorsal lithotomy position and  prepped and draped with Betadine in sterile fashion.  Cystourethroscopy was  performed with a 22.5 French Olympus pan endoscope utilizing the 12 and 70  degree lenses.  The bladder was carefully inspected and it had moderate  trilobar hypertrophy.  The bladder was really smooth-walled and efflux of  clear urine was noted from normally placed ureteral orifices bilaterally.  A  0.038 French sensor wire was placed into the left  renal pelvis and  ureteroscopy was performed with a short, thin, semirigid ureteroscope.  The  lower two thirds  ureter was inspected and noted to be totally clear and there was really  nothing felt needed to be biopsied.  The ureteroscope was visually removed,  the wire was removed, the bladder was drained.  Panendoscope was removed.  The patient was taken to the operating room stable.      [name], M.D.  Electronically Signed     RLD/MEDQ  D:  01/09/2006  T:  01/09/2006  Job:  [id number]

## 2010-12-27 NOTE — Procedures (Signed)
[facility]  Patient:    James [name], James [name] Visit Number: [id number] MRN: [id number]          Service Type: Attending:  Sabino Gasser, M.D. Dictated by:   Sabino Gasser, M.D. Proc. Date: 09/30/01                             Procedure Report  PROCEDURE:  Colonoscopy.  INDICATION:  Hemoccult positivity.  ANESTHESIA:  Demerol 30, Versed 2 extra mg.  DESCRIPTION OF PROCEDURE:  With the patient mildly sedated in the left lateral decubitus position, a rectal exam was performed which was unremarkable. Subsequently, the Olympus videoscopic colonoscope was inserted into the rectum and passed under direct vision to the cecum, identified by the ileocecal valve and the cecal base.  From this point, the colonoscope was slowly withdrawn, taking circumferential views of the entire colonic mucosa, stopping to photograph diverticula seen along the way, both in the right and left colon, until we reached approximately 15 cm from the anal verge, at which point a small polyp was seen and photographed and removed using hot biopsy forceps technique, setting of 20-20 blended current.  The endoscope was withdrawn to the rectum which appeared normal in direct view and showed hemorrhoids on retroflexed view.  The endoscope was straightened and withdrawn.  The patients vital signs and pulse oximeter remained stable.  The patient tolerated the procedure well without apparent complications.  FINDINGS: 1. Diverticulosis scattered throughout the colon, right and left. 2. A small polyp at 15 cm. 3. Internal hemorrhoids were seen.  PLAN:  Await biopsy report.  The patient will call me for results and follow up with me as an outpatient.  See endoscopy note for further details. Dictated by:   Sabino Gasser, M.D. Attending:  Sabino Gasser, M.D. DD:  09/30/01 TD:  09/30/01 Job: [id number]

## 2010-12-27 NOTE — Op Note (Signed)
NAME:  James [name], James [name]                            ACCOUNT NO.:  [id number]   MEDICAL RECORD NO.:  [id number]                   PATIENT TYPE:  AMB   LOCATION:  DAY                                  FACILITY:  [facility]   PHYSICIAN:  Melvyn Novas, M.D.                DATE OF BIRTH:  06/30/1936   DATE OF PROCEDURE:  07/19/2002  DATE OF DISCHARGE:                                 OPERATIVE REPORT   PREOPERATIVE DIAGNOSES:  Bladder tumor.   POSTOPERATIVE DIAGNOSES:  1. Bladder tumor.  2. Left ureteral tumor.   PROCEDURE:  1. Cystoscopy.  2. Transurethral resection of bladder tumor x3, medium.   ANESTHESIA:  General.   SURGEON:  [name], M.D.   THIRD ASSISTANT:  Melvyn Novas, M.D.   ESTIMATED BLOOD LOSS:  Minimal.   SPECIMENS:  Bladder tumor.   DRAINS:  Left double J ureteral stent.   COMPLICATIONS:  None.   FINDINGS:  1. Cystoscopy demonstrated three bladder tumors ranging from 1-2 cm each.     One tumor was about 2 cm and papillary in appearance at the bladder dome.     Another 1 cm papillary tumor appeared on the left lateral wall as well as     a 1-2 cm papillary appearing tumor within the left ureteral orifice.  2. Right retrograde pyelogram was performed and was normal. Left retrograde     pyelogram was also negative for any obvious filling defects, mucosal     abnormalities.  3. Left flexible ureteroscopy. The left side demonstrated multiple papillary     appearing tumors throughout the course of the distal third of the left     ureter.   BRIEF HISTORY:  Mr. Bostwick is a very nice 75 year old white male with a long-  term history of recurrent transitional cell carcinoma who underwent BCG  intravesical immunotherapy in the past. He is status post resection of two  bladder tumors in August of 2001 and also is noted to have a bulbar urethral  stricture at that time. He recently underwent surveillance cystoscopy which  demonstrated tumors consistent with the ones  described above. He presents  today for resection of these tumors as well as evaluation of his upper  tracts.   DESCRIPTION OF PROCEDURE:  Following administration of antibiotics and  anesthesia, Mr. Frigon was prepped and draped in the dorsal lithotomy  position. Standard cystoscopy was performed using 30 degree and 70 degree  lenses. Findings are as above. There were no other mucosal abnormalities.  Following cystoscopy, cold cup biopsy forceps were placed through the  cystoscope sheath and under direct visual guidance, the tumors were removed  using cold cup biopsy forceps. The resection beds were then fulgurated using  a Bugbee electrode. Prior to resection of the tumor at the left ureteral  orifice; however, a 0.038 guidewire was passed up the left ureter under  fluoroscopic and  cystoscopic guidance. This tumor was then removed with the  cold cup biopsy forceps and the base was gently fulgurated using the Bugbee  electrode. A flexible ureteroscope was then passed over the wire under  cystoscopic guidance. We were able to get the flexible ureteroscope up to  the mid ureter but were not able to pass it any more proximal than this. The  wire was therefore removed. __________ was directly visualized at the mid  left ureter with the ureteroscope. There was a slight narrowing of the  ureter at the mid ureter which was the apparent cause of inability to pass  the ureteroscope more proximally. At that time, we injected contrast through  the ureteroscope and performed retrograde pyelography of the kidney and  upper part of the left ureter. There are no mucosal defects. We then  performed distal ureteroscopy by gently pulling the ureteroscope down the  left ureter. Prior to doing so, we placed an additional safety wire up the  left ureter. During flexible ureteroscopy, we noted several lesions within  the distal left ureter as noted above. The ureteroscope was then removed. At  that time, we  turned our attention to the right ureteral orifice and  performed a right sided retrograde pyelogram using an endhole catheter in  the normal fashion. The right upper tract was normal. The cystoscope was  then back loaded over the wire that was in the left ureter and a 6 x 26  double J ureteral stent with a tether was pushed over the wire under  fluoroscopic and cystoscopic guidance such that the proximal end of that  curled in the renal pelvis in the distal end of the bladder. The bladder was  then emptied. The cystoscope was removed. The tether on the stent was taped  to the dorsum of the penis. The procedure was then terminated.   DISPOSITION:  The patient was taken to the recovery room in stable  condition.                                               Melvyn Novas, M.D.    DK/MEDQ  D:  07/19/2002  T:  07/19/2002  Job:  [id number]

## 2010-12-27 NOTE — Op Note (Signed)
NAME:  James [name], James [name]                            ACCOUNT NO.:  [id number]   MEDICAL RECORD NO.:  [id number]                   PATIENT TYPE:  INP   LOCATION:  0001                                 FACILITY:  [facility]   PHYSICIAN:  [name], M.D.               DATE OF BIRTH:  [birth year]   DATE OF PROCEDURE:  08/05/2002  DATE OF DISCHARGE:                                 OPERATIVE REPORT   PREOPERATIVE DIAGNOSIS:  1. History of transitional cell carcinoma of the bladder.  2. Possible left ureteral tumor.   ANESTHESIA:  General.   PROCEDURES:  1. Cystoscopy.  2. Left retrograde pyelography.  3. Left ureteroscopic ureteral biopsies.  4. Left ureteroscopic brush biopsies.  5. Left ureteral stent placement.   SURGEON:  [name], M.D.   ASSISTANT:  Melvyn Novas, M.D.   ESTIMATED BLOOD LOSS:  Minimal.   DRAINS:  Left-sided 7 x 26 double J ureteral stent.   SPECIMENS:  Ureteral biopsy and ureteral brush biopsies.   BRIEF HISTORY:  The patient is a very nice 75 year old black male with a  history of superficial transitional cell carcinoma of the bladder.  Recent  surveillance cystoscopy demonstrated two bladder tumors which were resected,  and the pathology of these returned superficial transitional cell carcinoma.  Additionally he had what appeared to be a TCC emanating from his left  ureteral orifice.  Thorough biopsies were taken of this; however, these  returned with a diagnosis of hyperplasia rather than carcinoma.  Also at  that time,  left-sided ureteroscopy was performed.  There were multiple  papillary lesions in the left distal ureter that appeared to be transitional  cell carcinoma.  At that time that procedure was terminated.  Left  nephroureterectomy was planned for the patient; however, upon evaluation of  the pathologic diagnosis of hyperplasia, we were reticent to remove his  kidney and ureter prior to obtaining a tissue diagnosis from the left  ureter.   Therefore, we opted for ureteroscopic biopsies after discussion  with the patient.  He was taken to the operating room for that procedure  today.   DESCRIPTION OF PROCEDURE:  Following the administration of antibiotics and  anesthesia, the patient was prepped and draped in the dorsal lithotomy  position.  Standard cystoscopy was performed using a 12 degree lens.  The  left ureteral orifice was identified.  There was no remaining mass at the  left ureteral orifice.  There were no obvious remaining tumors in the  bladder.  A 0.038 guidewire was used to cannulate the left ureteral orifice.  We were able to pass this approximately one-third up the ureter without  resistance.  However, approximately one-third to one-half way up the ureter,  some resistance was encountered.  Therefore, an open-ended catheter was  placed over the wire.  Left retrograde pyelogram was performed, which  demonstrated dilated ureter but no  obvious filling defects.  We were able to  then pass a Glidewire through the open-ended catheter up into the left renal  pelvis without difficulty.  The open-ended catheter was then passed over the  Glidewire and the wire was removed and replaced with a 0.038 Teflon-coated  guidewire.  At that time a 4 cm dilating balloon was passed over the wire  through the cystoscope and under fluoroscopic guidance, the distal 3 cm of  the ureter was balloon-dilated without difficulty.  The balloon dilator was  then removed.  The cystoscope was then removed while keeping the wire in  place.  Semirigid ureteroscopy was then performed.  Again there were  multiple papillary-appearing lesions throughout the distal left ureter.  Using flexible biopsy forceps, multiple biopsies were taken of this tissue.  Two separate brushings were then performed of this area.  All these  specimens were sent to pathology.  At that time it was noted that the  guidewire may have been passed through a short submucosal  tunnel prior to re-  entering the lumen of the ureter; therefore, a second guidewire was passed  up the ureter under ureteroscopic guidance without difficulty.  The first  wire was then removed.  The ureteroscope was then removed.  The cystoscope  was back-loaded over the remaining guidewire.  A 6 x 26 double J ureteral  stent was passed up the left ureter under fluoroscopic and cystoscopic  guidance until the proximal coil in the renal pelvis and the distal end in  the bladder.  The bladder was then emptied.  The cystoscope was then  removed.  Proper placement of the stent was again confirmed  fluoroscopically.  The procedure was then terminated.   DISPOSITION:  The patient was taken to the recovery room in stable  condition.      Melvyn Novas, M.D.                      [name], M.D.    DK/MEDQ  D:  08/05/2002  T:  08/05/2002  Job:  [id number]

## 2010-12-27 NOTE — Op Note (Signed)
James [name], James [name] NO.:  [id number]   MEDICAL RECORD NO.:  [id number]          PATIENT TYPE:  AMB   LOCATION:  [facility]                         FACILITY:  [facility]   PHYSICIAN:  [name] III, M.D.DATE OF BIRTH:  September 03, 1935   DATE OF PROCEDURE:  07/31/2004  DATE OF DISCHARGE:                                 OPERATIVE REPORT   PREOPERATIVE DIAGNOSES:  History of transitional cell carcinoma of the  bladder and hyperplasia of the left lower ureter.   POSTOPERATIVE DIAGNOSES:  History of transitional cell carcinoma of the  bladder and hyperplasia of the left lower ureter.   OPERATION:  Cystourethroscopy, left ureteroscopy, diagnostic only.   SURGEON:  [name], M.D.   ANESTHESIA:  LMA.   ESTIMATED BLOOD LOSS:  Negligible.   TUBES:  None.   COMPLICATIONS:  None.   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  He has undergone multiple treatments with TURBT's and BCG who was found to  have the lower third of his ureter covered with a lesion that appeared to be  transitional cell carcinoma but on two separate biopsies were found to be  hyperplasia only. It was elected to proceed with double J stent and BCG for  reflux therapy of the lower ureter. This has been performed in the past and  he has had no evidence of recurrence. He is for cystourethroscopy and left  ureteroscopy for diagnostic purposes. He has had no blood or other problems.   DESCRIPTION OF PROCEDURE:  The patient was placed in supine position and  after proper LMA anesthesia was placed in the dorsal lithotomy position,  prepped and draped with Betadine in a sterile fashion.  The bladder was  carefully inspected with 12 and 70 degree lenses, efflux of clear urine was  noted from the normally placed ureteral orifices bilaterally but the left  ureteral orifice was golf hole due to  prior therapy. The bladder was totally  smooth walled, there was moderate trilobar hypertrophy.  There were  absolutely no lesions noted, nothing to biopsy.  Under fluoroscopic  guidance, a 0.038 French Teflon coated guidewire was placed through the left  ureteral orifice into the left renal pelvis and the lower third ureter was  ureteroscoped with a short thin Wolf semirigid ureteroscope.  It was noted  to be smooth walled and again absolutely no lesions were noted and nothing  for biopsy was indicated. The  ureteroscope was visually removed, no perforations or bleeding was noted.  The bladder was drained, all scopes were removed and the patient was taken  to the recovery room stable.   PLAN:  He will followup in three months for office cystourethroscopy.     Rona   RLD/MEDQ  D:  07/31/2004  T:  07/31/2004  Job:  [id number]

## 2010-12-27 NOTE — Op Note (Signed)
NAME:  James [name], James [name]                            ACCOUNT NO.:  [id number]   MEDICAL RECORD NO.:  [id number]                   PATIENT TYPE:  AMB   LOCATION:  [facility]                                 [facility]ACILITY:  [facility]   PHYSICIAN:  James [name], M.D.           DATE OF BIRTH:  1936-03-08   DATE OF PROCEDURE:  07/18/2003  DATE OF DISCHARGE:                                 OPERATIVE REPORT   PREOPERATIVE DIAGNOSES:  1. History of transitional cell carcinoma of the bladder.  2. History of left ureteral lesion which was hyperplasia on biopsy status     post BCG in the past with left ureteral stent placed.   POSTOPERATIVE DIAGNOSES:  1. History of transitional cell carcinoma of the bladder.  2. History of left ureteral lesion which was hyperplasia on biopsy status     post BCG in the past with left ureteral stent placed.   PROCEDURE:  Cystourethroscopy, left ureteroscopy with ureteral biopsy and  placement of left double J stent.   SURGEON:  [name], M.D.   ANESTHESIA:  LMA.   ESTIMATED BLOOD LOSS:  Negligible.   TUBES:  26 cm 6 [place] Cook double pigtail stent.   COMPLICATIONS:  None.   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  He had undergone treatment with intravesical chemotherapy and immunotherapy  several times and was found to have a left lower ureteral lesion which was  suspicious for transitional cell carcinoma.  On two separate biopsies, they  were noted to be hyperplasia only.  After contemplating a left  nephroureterectomy with the diagnosis of hyperplasia, it was elected to  place a left double J stent and treat him with BCG intravesically to bathe  the lower ureter.  Followup biopsies on November 15, 2002 revealed no lesions.  He is now following up for that.   DESCRIPTION OF PROCEDURE:  The patient was placed in supine position and  after proper LMA  anesthesia was placed in the dorsal lithotomy position and  prepped and draped with Betadine in a sterile fashion.  Cystourethroscopy  was performed with a 22.5 [place] Olympus panendoscope. Utilizing the 12 and  70 degrees lenses, the bladder was carefully inspected, efflux of clear  urine was noted from the normally placed ureteral orifices bilaterally.  There was a high bladder neck and mild trilobar hypertrophy.  The bladder  was really smooth walled and there were absolutely no lesions noted in the  bladder.  A 0.38 French Teflon coated guidewire was placed in the left  ureter up to the left renal pelvis and utilizing a short thin Wolf semirigid  ureteroscope the lower third ureter was examined and approximately a  centimeter above the orifice in the 4 o'clock position was a tiny  pedunculated lesion with a stalk that was approximately  a millimeter in  diameter. It was grasped with biopsy forceps and removed in its total  entirety and submitted to pathology.  No other lesions were noted to be  present.  There was good hemostasis noted.  It was felt that due to the  biopsy a stent should probably be placed and under fluoroscopic guidance, a  26 cm 6 French Cook double pigtail stent was placed and noted to be in good  position within the left renal pelvis within the bladder.  The pullout  string was attached, bladder was drained and panendoscope removed.  The  string was taped to the penis and the patient was taken to the recovery room  stable.                                               James [name], M.D.    RLD/MEDQ  D:  07/18/2003  T:  07/18/2003  Job:  [id number]

## 2011-01-13 ENCOUNTER — Ambulatory Visit (HOSPITAL_COMMUNITY): Payer: Medicare Other | Attending: Internal Medicine

## 2011-01-13 ENCOUNTER — Other Ambulatory Visit (INDEPENDENT_AMBULATORY_CARE_PROVIDER_SITE_OTHER): Payer: Medicare Other | Admitting: *Deleted

## 2011-01-13 DIAGNOSIS — I4892 Unspecified atrial flutter: Secondary | ICD-10-CM

## 2011-01-13 DIAGNOSIS — I379 Nonrheumatic pulmonary valve disorder, unspecified: Secondary | ICD-10-CM | POA: Insufficient documentation

## 2011-01-13 DIAGNOSIS — I080 Rheumatic disorders of both mitral and aortic valves: Secondary | ICD-10-CM | POA: Insufficient documentation

## 2011-01-13 DIAGNOSIS — I079 Rheumatic tricuspid valve disease, unspecified: Secondary | ICD-10-CM | POA: Insufficient documentation

## 2011-01-13 LAB — CBC WITH DIFFERENTIAL/PLATELET
Basophils Absolute: 0 10*3/uL (ref 0.0–0.1)
Eosinophils Relative: 2.8 % (ref 0.0–5.0)
HCT: 41 % (ref 39.0–52.0)
Lymphs Abs: 1.1 10*3/uL (ref 0.7–4.0)
MCV: 97.7 fl (ref 78.0–100.0)
Monocytes Absolute: 0.5 10*3/uL (ref 0.1–1.0)
Monocytes Relative: 9.9 % (ref 3.0–12.0)
Neutrophils Relative %: 64.8 % (ref 43.0–77.0)
Platelets: 143 10*3/uL — ABNORMAL LOW (ref 150.0–400.0)
RDW: 13.9 % (ref 11.5–14.6)
WBC: 4.9 10*3/uL (ref 4.5–10.5)

## 2011-01-13 LAB — BASIC METABOLIC PANEL
BUN: 13 mg/dL (ref 6–23)
Creatinine, Ser: 0.9 mg/dL (ref 0.4–1.5)
GFR: 83.56 mL/min (ref >60.00)
Glucose, Bld: 105 mg/dL — ABNORMAL HIGH (ref 70–99)
Potassium: 4.4 mEq/L (ref 3.5–5.1)

## 2011-01-13 LAB — PROTIME-INR
INR: 1 ratio (ref 0.8–1.0)
Prothrombin Time: 11.2 s (ref 10.2–12.4)

## 2011-01-16 ENCOUNTER — Ambulatory Visit (HOSPITAL_COMMUNITY)
Admission: RE | Admit: 2011-01-16 | Discharge: 2011-01-17 | Disposition: A | Discharge status: Home / Self Care | Payer: Medicare Other | Source: Ambulatory Visit | Attending: Internal Medicine | Admitting: Internal Medicine

## 2011-01-16 DIAGNOSIS — R51 Headache: Secondary | ICD-10-CM | POA: Insufficient documentation

## 2011-01-16 DIAGNOSIS — C679 Malignant neoplasm of bladder, unspecified: Secondary | ICD-10-CM | POA: Insufficient documentation

## 2011-01-16 DIAGNOSIS — F329 Major depressive disorder, single episode, unspecified: Secondary | ICD-10-CM | POA: Insufficient documentation

## 2011-01-16 DIAGNOSIS — G4730 Sleep apnea, unspecified: Secondary | ICD-10-CM | POA: Insufficient documentation

## 2011-01-16 DIAGNOSIS — I4892 Unspecified atrial flutter: Secondary | ICD-10-CM | POA: Insufficient documentation

## 2011-01-16 DIAGNOSIS — F3289 Other specified depressive episodes: Secondary | ICD-10-CM | POA: Insufficient documentation

## 2011-01-17 ENCOUNTER — Ambulatory Visit (HOSPITAL_COMMUNITY): Payer: Medicare Other

## 2011-02-11 NOTE — Discharge Summary (Signed)
  [name], James NO.:  [id number]  MEDICAL RECORD NO.:  [id number]  LOCATION:  3735                         FACILITY:  [facility]  PHYSICIAN:  James Salvia, MD, FACCDATE OF BIRTH:  02-13-1936  DATE OF ADMISSION:  01/16/2011 DATE OF DISCHARGE:  01/17/2011                              DISCHARGE SUMMARY   PRIMARY CARE PHYSICIAN:  James Murphy. Renne Crigler, MD  PRIMARY CARDIOLOGIST:  James Mixer, MD  ELECTROPHYSIOLOGIST:  James Salvia, MD, [facility]  PRIMARY DIAGNOSIS:  Atrial flutter.  SECONDARY DIAGNOSES: 1. Bladder cancer. 2. Sleep apnea. 3. Depression.  ALLERGIES:  The patient is allergic to PROTONIX  with FLOMAX.  PROCEDURES THIS ADMISSION: 1. Electrophysiology study and radiofrequency catheter ablation of     atrial flutter by Dr. Graciela [name] on January 16, 2011.  This study     demonstrated atrial flutter, which terminated with the ablation of     the cavotricuspid isthmus.  The patient had restoration of sinus     rhythm. Postprocedure had no early apparent complications. 2. CT of the head on January 17, 2011, which was negative.  BRIEF HISTORY OF PRESENT ILLNESS:  Mr. James [name] is a 75 year old male with history of atrial flutter, status post cardioversion in the past.  He was found to be in recurrent atrial flutter this spring.  Pradaxa was initiated, and he was referred to Dr. Graciela [name] in the outpatient setting for consideration of catheter ablation.  Risks, benefits, and alternatives of catheter ablation were discussed with the patient, he wished to proceed.  HOSPITAL COURSE:  The patient was admitted on January 16, 2011, for planned ablation of atrial flutter.  This was carried out by Dr. Graciela [name] with details outlined above.  The patient was monitored on telemetry overnight, which demonstrated sinus rhythm.  The patient's groin incisions were without hematoma or bruit.  However, the patient did complain of a headache on January 17, 2011.  Because of this Dr.  Graciela [name] ordered a head CT, which was negative.  The patient was considered stable for discharge by Dr. Graciela [name] with instructions of discontinuing aspirin and metoprolol post successful ablation of atrial flutter.  FOLLOWUP APPOINTMENTS: 1. Dr. Graciela [name] on February 28, 2011, at 2:15 p.m. 2. Dr. Elease [name] as scheduled. 3. Dr. Johney [name] as scheduled.  DISCHARGE INSTRUCTIONS: 1. Increase activity slowly. 2. No driving for 2 days. 3. Follow low-sodium heart-healthy diet. 4. Keep groin incisions clean and dry.  DISCHARGE MEDICATIONS: 1. Sertraline 100 mg daily. 2. Xarelto 20 mg daily. 3. Of note, the patient's metoprolol and aspirin have been     discontinued this admission.  DISPOSITION:  The patient was seen and examined by Dr. Graciela [name] on January 17, 2011, and considered stable for discharge.  DURATION OF DISCHARGE ENCOUNTER:  Thirty five minutes.     James Balsam, RN,BSN   ______________________________ James Salvia, MD, [facility]    AS/MEDQ  D:  01/17/2011  T:  01/18/2011  Job:  [id number]  cc:   James [name], M.D. James [name], M.D. Electronically Signed by James [name] RNBSN on 01/27/2011 09:44:36 AM Electronically Signed by James Manges MD [facility] on 02/11/2011 04:30:37 PM

## 2011-02-28 ENCOUNTER — Ambulatory Visit (INDEPENDENT_AMBULATORY_CARE_PROVIDER_SITE_OTHER): Payer: Medicare Other | Admitting: Internal Medicine

## 2011-02-28 ENCOUNTER — Encounter: Payer: Self-pay | Admitting: Internal Medicine

## 2011-02-28 DIAGNOSIS — I4891 Unspecified atrial fibrillation: Secondary | ICD-10-CM

## 2011-02-28 DIAGNOSIS — I4892 Unspecified atrial flutter: Secondary | ICD-10-CM

## 2011-02-28 DIAGNOSIS — R0600 Dyspnea, unspecified: Secondary | ICD-10-CM | POA: Insufficient documentation

## 2011-02-28 DIAGNOSIS — Z95810 Presence of automatic (implantable) cardiac defibrillator: Secondary | ICD-10-CM

## 2011-02-28 DIAGNOSIS — R0602 Shortness of breath: Secondary | ICD-10-CM

## 2011-02-28 DIAGNOSIS — N186 End stage renal disease: Secondary | ICD-10-CM

## 2011-02-28 MED ORDER — ATENOLOL 25 MG PO TABS: 25.0000 mg | ORAL_TABLET | Freq: Every day | ORAL | Status: DC

## 2011-02-28 MED ORDER — NADOLOL 20 MG PO TABS: 20.0000 mg | ORAL_TABLET | Freq: Every day | ORAL | Status: DC

## 2011-02-28 MED ORDER — PROPRANOLOL HCL 60 MG PO CP24: 60.0000 mg | ORAL_CAPSULE | Freq: Every day | ORAL | Status: DC

## 2011-02-28 NOTE — Assessment & Plan Note (Signed)
imporveing

## 2011-02-28 NOTE — Patient Instructions (Addendum)
We will see you back on an as needed basis.  

## 2011-02-28 NOTE — Assessment & Plan Note (Signed)
Status post flutter ablation. We'll discontinue his oral anticoagulation. He has been shown how to use his pulse meter i.e. His finger  And he is to call us if he has recurrent syymptoms

## 2011-02-28 NOTE — Progress Notes (Signed)
  HPI  James [name] is a 75 y.o. male Seen in followup for atrial flutter ablation Following recurrence. He had both typical and atypical flutter. Following his ablation procedure he has had significant fatigue that has gradually improved. There Has been significant concurrent psychosocial stress.  .   .       Past Medical History  Diagnosis Date  . Atrial flutter 05/2009    STATUS POST CARDIOVERSION   . Bladder cancer   . Sleep apnea   . Depression     Past Surgical History  Procedure Date  . Carpal tunnel release   . Bladder surgery     Current Outpatient Prescriptions  Medication Sig Dispense Refill  . magnesium oxide (MAG-OX) 400 MG tablet Take 50 mg by mouth daily.        . Rivaroxaban (XARELTO) 20 MG TABS Take 20 mg by mouth daily.  30 tablet  12  . sertraline (ZOLOFT) 100 MG tablet Take 100 mg by mouth daily.        . Turmeric POWD by Does not apply route daily.          Allergies  Allergen Reactions  . Flomax (Tamsulosin Hcl)   . Protonix     Review of Systems negative except from HPI and PMH  Physical Exam Well developed and well nourished in no acute distress HENT normal E scleral and icterus clear Neck Supple JVP flat; carotids brisk and full Clear to ausculation Regular rate and rhythm, no murmurs gallops or rub Soft with active bowel sounds No clubbing cyanosis and edema Alert and oriented, grossly normal motor and sensory function Skin Warm and Dry  ECG Sinus rhythm with first degree AV block at a rate of 62  Assessment and  Plan

## 2011-03-13 ENCOUNTER — Encounter: Payer: Self-pay | Admitting: Cardiovascular Disease

## 2011-03-13 ENCOUNTER — Ambulatory Visit (INDEPENDENT_AMBULATORY_CARE_PROVIDER_SITE_OTHER): Payer: Medicare Other | Admitting: Cardiovascular Disease

## 2011-03-13 VITALS — BP 120/70 | HR 76 | Ht 69.0 in | Wt 233.6 lb

## 2011-03-13 DIAGNOSIS — I4892 Unspecified atrial flutter: Secondary | ICD-10-CM

## 2011-03-13 DIAGNOSIS — G4730 Sleep apnea, unspecified: Secondary | ICD-10-CM

## 2011-03-13 NOTE — Assessment & Plan Note (Signed)
He is doing very well with his sleep apnea. I've encouraged him to exercise and hypertension some weight which will help with his sleep apnea.

## 2011-03-13 NOTE — Assessment & Plan Note (Signed)
He is doing very well.  No arrhythmias.  Off anticoagulants.  Encouraged him to exercise.

## 2011-03-13 NOTE — Progress Notes (Signed)
James [name] Date of Birth  03-04-1936 [facility] [address]     [address] [place], [place]  [address] [phone]  Fax  [phone]  History of Present Illness:  James [name] is a 75 year old gentleman with a history of atrial flutter. He status post cardioversion in October 2010. He status post atrial flutter ablation in June of 2012.  He's done for well from a cardiac standpoint.   He is off his anticoagulants at this point.   He has not had any episodes of chest pain or shortness of breath.  He has occasional palpitations. He is only associated with anything in particular.  He specific activities such as eating, drinking, or change of position. They're also not associated with exercise.  He also has a history of sleep apnea. He wears CPAP at night. He seems to be doing doing quite well at this.  Is not exercising quite as much as he would like.   Current Outpatient Prescriptions on File Prior to Visit  Medication Sig Dispense Refill  . magnesium oxide (MAG-OX) 400 MG tablet Take 50 mg by mouth daily.        . sertraline (ZOLOFT) 100 MG tablet Take 100 mg by mouth daily.        . Turmeric POWD by Does not apply route daily.          Allergies  Allergen Reactions  . Flomax (Tamsulosin Hcl)   . Protonix     Past Medical History  Diagnosis Date  . Atrial flutter 05/2009    STATUS POST CARDIOVERSION   . Bladder cancer   . Sleep apnea   . Depression     Past Surgical History  Procedure Date  . Carpal tunnel release   . Bladder surgery     History  Smoking status  . Former Smoker  . Quit date: 08/11/1981  Smokeless tobacco  . Not on file    History  Alcohol Use No    Family History  Problem Relation Age of Onset  . Heart failure Father   . Stroke Father   . Kidney failure Mother   . Coronary artery disease      Reviw of Systems:  Reviewed in the HPI.  All other systems are negative.  Physical Exam: BP 120/70   Pulse 76  Ht 5\' 9"  (1.753 m)  Wt 233 lb 9.6 oz (105.96 kg)  BMI 34.50 kg/m2 The patient is alert and oriented x 3.  The mood and affect are normal.   Skin: warm and dry.  Color is normal.    HEENT:   the sclera are nonicteric.  The mucous membranes are moist.  The carotids are 2+ without bruits.  There is no thyromegaly.  There is no JVD.    Lungs: clear.  The chest wall is non tender.    Heart: regular rate with a normal S1 and S2.  There are no murmurs, gallops, or rubs. The PMI is not displaced.     Abdomen: good bowel sounds.  There is no guarding or rebound.  There is no hepatosplenomegaly or tenderness.  There are no masses.   Extremities:  no clubbing, cyanosis, or edema.  The legs are without rashes.  The distal pulses are intact.   Neuro:  Cranial nerves II - XII are intact.  Motor and sensory functions are intact.    The gait is normal.  ECG:  Assessment / Plan:

## 2012-01-02 ENCOUNTER — Other Ambulatory Visit: Payer: Self-pay | Admitting: Dermatology

## 2012-01-26 ENCOUNTER — Ambulatory Visit (INDEPENDENT_AMBULATORY_CARE_PROVIDER_SITE_OTHER): Payer: Medicare Other | Admitting: Family Medicine

## 2012-01-26 VITALS — BP 119/69 | HR 64 | Temp 98.0°F | Resp 18 | Ht 69.25 in | Wt 262.8 lb

## 2012-01-26 DIAGNOSIS — M25569 Pain in unspecified knee: Secondary | ICD-10-CM

## 2012-01-26 DIAGNOSIS — M25561 Pain in right knee: Secondary | ICD-10-CM

## 2012-01-26 NOTE — Progress Notes (Signed)
Patient ID: James [name], male   DOB: 1935-09-26, 76 y.o.   MRN: [id number] James [name] is a 76 y.o. male who presents to Urgent Care today for Right knee pain of 1 day's duration:  1.  Right knee pain:  Present x 1 day.  Patient was walking up steps last night when he heard a "pop" in knee.  Had immediate pain.  Able to bear weight.  No noticeable swelling or redness.  Has arthritis in knee but no previous injury to knee.  Took Ibuprofen with relief of pain.  Today, pain much improved. Still taking Ibuprofen to prevent recurrence of pain.  Does experience "twinge of pain" (2-3/10) when walking up/down stairs.  Otherwise not having pain today.  No fevers or chills.  No recent illnesses or history of gout.      PMH reviewed.  ROS as above otherwise neg Medications reviewed. Current Outpatient Prescriptions  Medication Sig Dispense Refill  . fluocinolone (VANOS) 0.01 % cream Apply topically 2 (two) times daily.      . fluocinonide-emollient (LIDEX-E) 0.05 % cream Apply topically 2 (two) times daily.      . sertraline (ZOLOFT) 100 MG tablet Take 100 mg by mouth daily.        Marland Kitchen CINNAMON PO Take by mouth.        . magnesium oxide (MAG-OX) 400 MG tablet Take 50 mg by mouth daily.        . Turmeric POWD by Does not apply route daily.          Exam:  BP 119/69  Pulse 64  Temp 98 F (36.7 C) (Oral)  Resp 18  Ht 5' 9.25" (1.759 m)  Wt 262 lb 12.8 oz (119.205 kg)  BMI 38.53 kg/m2  SpO2 96% Gen: Well NAD HEENT: EOMI,  MMM Lungs: CTABL Nl WOB Heart: RRR no MRG Abd: NABS, NT, ND Exts: Non edematous BL  LE, warm and well perfused.  MSK:  Left knee WNL.  Right knee:  No effusion noted, no redness or warmth.  Ant/post drawer and McMurray's negative.  Lachmann's negative.  Modified Apley negative.  No joint laxity of MCL/LCL.  Patient able to ambulate without limping/pain.  Does experience some mild pain in posterior/lateral aspect of Right knee with contraction of thigh muscle.  No patellofemoral  pain.   Assessment and Plan: 1. Possible meniscal tear, posterio-lateral position:  No need for urgent repair in 76 yo Male. Discussed diagnosis with him and provided strengthening/rehab exercises for him.  To continue anti-inflammatories.  FU in 2 weeks if no improvement.  Discussed with Dr. Chilton Si as he was here in clinic and he agreed with plan.

## 2012-01-26 NOTE — Patient Instructions (Addendum)
It's possible that you have torn the lateral-posterior portion of your meniscus.  This provides some of the cushioning in your knee. The best way to help this is with strengthening of the supporting muscles and ligaments. Below are some of these exercises. You should continue to use ice for 20 minutes at a time and anti-inflammatories for the next 2 weeks. If you still have pain after 2 weeks, come back and see [place].      Meniscus Tear with Phase I Rehab The meniscus is a C-shaped cartilage structure, located in the knee joint between the thigh bone (femur) and the shinbone (tibia). Two menisci are located in each knee joint: the inner and outer meniscus. The meniscus acts as an adapter between the thigh bone and shinbone, allowing them to fit properly together. It also functions as a shock absorber, to reduce the stress placed on the knee joint and to help supply nutrients to the knee joint cartilage. As people age, the meniscus begins to harden and become more vulnerable to injury. Meniscus tears are a common injury, especially in older athletes. Inner meniscus tears are more common than outer meniscus tears.  SYMPTOMS   Pain in the knee, especially with standing or squatting with the affected leg.   Tenderness along the joint line.   Swelling in the knee joint (effusion), usually starting 1 to 2 days after injury.   Locking or catching of the knee joint, causing inability to straighten the knee completely.   Giving way or buckling of the knee.  CAUSES  A meniscus tear occurs when a force is placed on the meniscus that is greater than it can handle. Common causes of injury include:  Direct hit (trauma) to the knee.   Twisting, pivoting, or cutting (rapidly changing direction while running), kneeling or squatting.   Without injury, due to aging.  RISK INCREASES WITH:  Contact sports (football, rugby).   Sports in which cleats are used with pivoting (soccer, lacrosse) or sports in which  good shoe grip and sudden change in direction are required (racquetball, basketball, squash).   Previous knee injury.   Associated knee injury, particularly ligament injuries.   Poor strength and flexibility.  PREVENTION  Warm up and stretch properly before activity.   Maintain physical fitness:   Strength, flexibility, and endurance.   Cardiovascular fitness.   Protect the knee with a brace or elastic bandage.   Wear properly fitted protective equipment (proper cleats for the surface).  PROGNOSIS  Sometimes, meniscus tears heal on their own. However, definitive treatment requires surgery, followed by at least 6 weeks of recovery.  RELATED COMPLICATIONS   Recurring symptoms that result in a chronic problem.   Repeated knee injury, especially if sports are resumed too soon after injury or surgery.   Progression of the tear (the tear gets larger), if untreated.   Arthritis of the knee in later years (with or without surgery).   Complications of surgery, including infection, bleeding, injury to nerves (numbness, weakness, paralysis) continued pain, giving way, locking, nonhealing of meniscus (if repaired), need for further surgery, and knee stiffness (loss of motion).  TREATMENT  Treatment first involves the use of ice and medicine, to reduce pain and inflammation. You may find using crutches to walk more comfortable. However, it is okay to bear weight on the injured knee, if the pain will allow it. Surgery is often advised as a definitive treatment. Surgery is performed through an incision near the joint (arthroscopically). The torn piece of  the meniscus is removed, and if possible the joint cartilage is repaired. After surgery, the joint must be restrained. After restraint, it is important to perform strengthening and stretching exercises to help regain strength and a full range of motion. These exercises may be completed at home or with a therapist.  MEDICATION  If pain medicine  is needed, nonsteroidal anti-inflammatory medicines (aspirin and ibuprofen), or other minor pain relievers (acetaminophen), are often advised.   Do not take pain medicine for 7 days before surgery.   Prescription pain relievers may be given, if your caregiver thinks they are needed. Use only as directed and only as much as you need.  HEAT AND COLD  Cold treatment (icing) should be applied for 10 to 15 minutes every 2 to 3 hours for inflammation and pain, and immediately after activity that aggravates your symptoms. Use ice packs or an ice massage.   Heat treatment may be used before performing stretching and strengthening activities prescribed by your caregiver, physical therapist, or athletic trainer. Use a heat pack or a warm water soak.  SEEK MEDICAL CARE IF:   Symptoms get worse or do not improve in 2 weeks, despite treatment.   New, unexplained symptoms develop. (Drugs used in treatment may produce side effects.)  EXERCISES RANGE OF MOTION (ROM) AND STRETCHING EXERCISES - Meniscus Tear, Non-operative, Phase I These are some of the initial exercises with which you may start your rehabilitation program, until you see your caregiver again or until your symptoms are resolved. Remember:   These initial exercises are intended to be gentle. They will help you restore motion without increasing any swelling.   Completing these exercises allows less painful movement and prepares you for the more aggressive strengthening exercises in Phase II.   An effective stretch should be held for at least 30 seconds.   A stretch should never be painful. You should only feel a gentle lengthening or release in the stretched tissue.  RANGE OF MOTION - Knee Flexion, Active  Lie on your back with both knees straight. (If this causes back discomfort, bend your healthy knee, placing your foot flat on the floor.)   Slowly slide your heel back toward your buttocks until you feel a gentle stretch in the front of  your knee or thigh.   Hold for __________ seconds. Slowly slide your heel back to the starting position.  Repeat __________ times. Complete this exercise __________ times per day.  RANGE OF MOTION - Knee Flexion and Extension, Active-Assisted  Sit on the edge of a table or chair with your thighs firmly supported. It may be helpful to place a folded towel under the end of your right / left thigh.   Flexion (bending): Place the ankle of your healthy leg on top of the other ankle. Use your healthy leg to gently bend your right / left knee until you feel a mild tension across the top of your knee.   Hold for __________ seconds.   Extension (straightening): Switch your ankles so your right / left leg is on top. Use your healthy leg to straighten your right / left knee until you feel a mild tension on the backside of your knee.   Hold for __________ seconds.  Repeat __________ times. Complete __________ times per day. STRETCH - Knee Flexion, Supine  Lie on the floor with your right / left heel and foot lightly touching the wall. (Place both feet on the wall if you do not use a door frame.)  Without using any effort, allow gravity to slide your foot down the wall slowly until you feel a gentle stretch in the front of your right / left knee.   Hold this stretch for __________ seconds. Then return the leg to the starting position, using your healthy leg for help, if needed.  Repeat __________ times. Complete this stretch __________ times per day.  STRETCH - Knee Extension Sitting  Sit with your right / left leg/heel propped on another chair, coffee table, or foot stool.   Allow your leg muscles to relax, letting gravity straighten out your knee.*   You should feel a stretch behind your right / left knee. Hold this position for __________ seconds.  Repeat __________ times. Complete this stretch __________ times per day.  *Your physician, physical therapist or athletic trainer may instruct you  place a __________ weight on your thigh, just above your kneecap, to deepen the stretch.  STRENGTHENING EXERCISES - Meniscus Tear, Non-operative, Phase I These exercises may help you when beginning to rehabilitate your injury. They may resolve your symptoms with or without further involvement from your physician, physical therapist or athletic trainer. While completing these exercises, remember:   Muscles can gain both the endurance and the strength needed for everyday activities through controlled exercises.   Complete these exercises as instructed by your physician, physical therapist or athletic trainer. Progress the resistance and repetitions only as guided.  STRENGTH - Quadriceps, Isometrics  Lie on your back with your right / left leg extended and your opposite knee bent.   Gradually tense the muscles in the front of your right / left thigh. You should see either your knee cap slide up toward your hip or increased dimpling just above the knee. This motion will push the back of the knee down toward the floor, mat, or bed on which you are lying.   Hold the muscle as tight as you can, without increasing your pain, for __________ seconds.   Relax the muscles slowly and completely between each repetition.  Repeat __________ times. Complete this exercise __________ times per day.  STRENGTH - Quadriceps, Short Arcs   Lie on your back. Place a __________ inch towel roll under your right / left knee, so that the knee bends slightly.   Raise only your lower leg by tightening the muscles in the front of your thigh. Do not allow your thigh to rise.   Hold this position for __________ seconds.  Repeat __________ times. Complete this exercise __________ times per day.  OPTIONAL ANKLE WEIGHTS: Begin with ____________________, but DO NOT exceed ____________________. Increase in 1 pound/0.5 kilogram increments. STRENGTH - Quadriceps, Straight Leg Raises  Quality counts! Watch for signs that the  quadriceps muscle is working, to be sure you are strengthening the correct muscles and not "cheating" by substituting with healthier muscles.  Lay on your back with your right / left leg extended and your opposite knee bent.   Tense the muscles in the front of your right / left thigh. You should see either your knee cap slide up or increased dimpling just above the knee. Your thigh may even shake a bit.   Tighten these muscles even more and raise your leg 4 to 6 inches off the floor. Hold for __________ seconds.   Keeping these muscles tense, lower your leg.   Relax the muscles slowly and completely in between each repetition.  Repeat __________ times. Complete this exercise __________ times per day.  STRENGTH - Hamstring, Curls  Lay on your stomach with your legs extended. (If you lay on a bed, your feet may hang over the edge.)   Tighten the muscles in the back of your thigh to bend your right / left knee up to 90 degrees. Keep your hips flat on the bed.   Hold this position for __________ seconds.   Slowly lower your leg back to the starting position.  Repeat __________ times. Complete this exercise __________ times per day.  STRENGTH - Quadriceps, Squats  Stand in a door frame so that your feet and knees are in line with the frame.   Use your hands for balance, not support, on the frame.   Slowly lower your weight, bending at the hips and knees. Keep your lower legs upright so that they are parallel with the door frame. Squat only within the range that does not increase your knee pain. Never let your hips drop below your knees.   Slowly return upright, pushing with your legs, not pulling with your hands.  Repeat __________ times. Complete this exercise __________ times per day.  STRENGTH - Quad/VMO, Isometric   Sit in a chair with your right / left knee slightly bent. With your fingertips, feel the VMO muscle just above the inside of your knee. The VMO is important in  controlling the position of your kneecap.   Keeping your fingertips on this muscle. Without actually moving your leg, attempt to drive your knee down as if straightening your leg. You should feel your VMO tense. If you have a difficult time, you may wish to try the same exercise on your healthy knee first.   Tense this muscle as hard as you can without increasing any knee pain.   Hold for __________ seconds. Relax the muscles slowly and completely in between each repetition.  Repeat __________ times. Complete exercise __________ times per day.  Document Released: 08/11/1998 Document Revised: 07/17/2011 Document Reviewed: 11/09/2008 [facility] Patient Information 2012 [place], [place].The meniscus will not heal on

## 2012-07-30 ENCOUNTER — Other Ambulatory Visit: Payer: Self-pay | Admitting: Dermatology

## 2012-08-11 DIAGNOSIS — Z9289 Personal history of other medical treatment: Secondary | ICD-10-CM

## 2012-08-11 HISTORY — DX: Personal history of other medical treatment: Z92.89

## 2012-11-13 HISTORY — PX: CYSTOSCOPY: SUR368

## 2012-12-01 ENCOUNTER — Encounter: Payer: Self-pay | Admitting: Cardiovascular Disease

## 2013-03-10 ENCOUNTER — Other Ambulatory Visit: Payer: Self-pay | Admitting: Internal Medicine

## 2013-03-10 DIAGNOSIS — N50811 Right testicular pain: Secondary | ICD-10-CM

## 2013-03-11 ENCOUNTER — Ambulatory Visit
Admission: RE | Admit: 2013-03-11 | Discharge: 2013-03-11 | Disposition: A | Discharge status: Home / Self Care | Payer: Medicare Other | Source: Ambulatory Visit | Attending: Internal Medicine | Admitting: Internal Medicine

## 2013-03-11 DIAGNOSIS — N50811 Right testicular pain: Secondary | ICD-10-CM

## 2013-03-11 HISTORY — PX: RENAL MASS EXCISION: SHX2324

## 2013-03-23 ENCOUNTER — Emergency Department (HOSPITAL_COMMUNITY)
Admission: EM | Admit: 2013-03-23 | Discharge: 2013-03-24 | Disposition: A | Discharge status: Home / Self Care | Payer: Medicare Other | Attending: Emergency Medicine | Admitting: Emergency Medicine

## 2013-03-23 DIAGNOSIS — F3289 Other specified depressive episodes: Secondary | ICD-10-CM | POA: Insufficient documentation

## 2013-03-23 DIAGNOSIS — F329 Major depressive disorder, single episode, unspecified: Secondary | ICD-10-CM | POA: Insufficient documentation

## 2013-03-23 DIAGNOSIS — Z8679 Personal history of other diseases of the circulatory system: Secondary | ICD-10-CM | POA: Insufficient documentation

## 2013-03-23 DIAGNOSIS — Z8551 Personal history of malignant neoplasm of bladder: Secondary | ICD-10-CM | POA: Insufficient documentation

## 2013-03-23 DIAGNOSIS — G8918 Other acute postprocedural pain: Secondary | ICD-10-CM | POA: Insufficient documentation

## 2013-03-23 DIAGNOSIS — Y838 Other surgical procedures as the cause of abnormal reaction of the patient, or of later complication, without mention of misadventure at the time of the procedure: Secondary | ICD-10-CM | POA: Insufficient documentation

## 2013-03-23 DIAGNOSIS — R509 Fever, unspecified: Secondary | ICD-10-CM | POA: Insufficient documentation

## 2013-03-23 DIAGNOSIS — R109 Unspecified abdominal pain: Secondary | ICD-10-CM | POA: Insufficient documentation

## 2013-03-23 DIAGNOSIS — Z79899 Other long term (current) drug therapy: Secondary | ICD-10-CM | POA: Insufficient documentation

## 2013-03-23 DIAGNOSIS — Z87891 Personal history of nicotine dependence: Secondary | ICD-10-CM | POA: Insufficient documentation

## 2013-03-24 ENCOUNTER — Encounter (HOSPITAL_COMMUNITY): Payer: Self-pay | Admitting: *Deleted

## 2013-03-24 LAB — URINALYSIS, ROUTINE W REFLEX MICROSCOPIC
Nitrite: NEGATIVE
Specific Gravity, Urine: 1.018 (ref 1.005–1.030)
Urobilinogen, UA: 0.2 mg/dL (ref 0.0–1.0)
pH: 5 (ref 5.0–8.0)

## 2013-03-24 LAB — POCT I-STAT, CHEM 8
HCT: 38 % — ABNORMAL LOW (ref 39.0–52.0)
Hemoglobin: 12.9 g/dL — ABNORMAL LOW (ref 13.0–17.0)
Sodium: 136 mEq/L (ref 135–145)
TCO2: 23 mmol/L (ref 0–100)

## 2013-03-24 LAB — URINE MICROSCOPIC-ADD ON

## 2013-03-24 LAB — CBC
Platelets: 141 10*3/uL — ABNORMAL LOW (ref 150–400)
RBC: 3.99 MIL/uL — ABNORMAL LOW (ref 4.22–5.81)
WBC: 8.9 10*3/uL (ref 4.0–10.5)

## 2013-03-24 MED ORDER — SODIUM CHLORIDE 0.9 % IV SOLN
INTRAVENOUS | Status: DC
  Administered 2013-03-24: 01:00:00 via INTRAVENOUS

## 2013-03-24 MED ORDER — SULFAMETHOXAZOLE-TRIMETHOPRIM 800-160 MG PO TABS: 1.0000 | ORAL_TABLET | Freq: Two times a day (BID) | ORAL | Status: DC

## 2013-03-24 MED ORDER — KETOROLAC TROMETHAMINE 30 MG/ML IJ SOLN
30.0000 mg | Freq: Once | INTRAMUSCULAR | Status: AC
  Administered 2013-03-24: 30 mg via INTRAVENOUS
  Filled 2013-03-24: qty 1

## 2013-03-24 MED ORDER — SULFAMETHOXAZOLE-TMP DS 800-160 MG PO TABS
1.0000 | ORAL_TABLET | Freq: Once | ORAL | Status: AC
  Administered 2013-03-24: 1 via ORAL
  Filled 2013-03-24: qty 1

## 2013-03-24 NOTE — ED Notes (Signed)
Pt had kidney surgery yesterday and had stent/drain placed, went for followup today and had tube clamped, now having drainage on bandage they did this evening and was told to come to the ED for evaluation.

## 2013-03-24 NOTE — ED Provider Notes (Signed)
CSN: [id number]     Arrival date & time 03/23/13  2338 History     First MD Initiated Contact with Patient 03/24/13 0021     Chief Complaint  Patient presents with  . Post-op Problem   (Consider location/radiation/quality/duration/timing/severity/associated sxs/prior Treatment) HPI Hx per PT -  L kidney mass resection yesterday at [facility] by DR Earlene Plater and Oren Section, discharged home today.  Tonight he noticed bleeding from wound site and temp to 101 at home despite taking 3 norco.  He has ongoing unchanged L lateral rib wound site pain. No cough or SOB. No ABD pain otherwise. Bleeding described as saturated dressing with blood dripping from the wound site. He called [facility] and was referred here for evaluation.  Now in the ED, bleeding has stopped.   Past Medical History  Diagnosis Date  . Atrial flutter 05/2009    STATUS POST CARDIOVERSION   . Bladder cancer   . Sleep apnea   . Depression    Past Surgical History  Procedure Laterality Date  . Carpal tunnel release    . Bladder surgery     Family History  Problem Relation Age of Onset  . Heart failure Father   . Stroke Father   . Kidney failure Mother   . Coronary artery disease     History  Substance Use Topics  . Smoking status: Former Smoker    Quit date: 08/11/1981  . Smokeless tobacco: Not on file  . Alcohol Use: No    Review of Systems  Constitutional: Positive for fever.  HENT: Negative for neck pain.   Respiratory: Negative for shortness of breath.   Cardiovascular: Negative for chest pain.  Gastrointestinal: Negative for abdominal pain.  Genitourinary: Positive for flank pain.  Musculoskeletal: Negative for back pain.  Skin: Positive for wound. Negative for rash.  Neurological: Negative for headaches.  All other systems reviewed and are negative.    Allergies  Bupropion; Flomax; and Pantoprazole sodium  Home Medications   Current Outpatient Rx  Name  Route  Sig  Dispense  Refill   . CINNAMON PO   Oral   Take by mouth.           . fluocinolone (VANOS) 0.01 % cream   Topical   Apply topically 2 (two) times daily.         . fluocinonide-emollient (LIDEX-E) 0.05 % cream   Topical   Apply topically 2 (two) times daily.         . magnesium oxide (MAG-OX) 400 MG tablet   Oral   Take 50 mg by mouth daily.           . sertraline (ZOLOFT) 100 MG tablet   Oral   Take 100 mg by mouth daily.           . Turmeric POWD   Does not apply   by Does not apply route daily.            BP 121/59  Pulse 79  Temp(Src) 100.3 F (37.9 C) (Oral)  Resp 16  Ht 5\' 9"  (1.753 m)  Wt 254 lb (115.214 kg)  BMI 37.49 kg/m2  SpO2 95% Physical Exam  Constitutional: He is oriented to person, place, and time. He appears well-developed and well-nourished.  HENT:  Head: Normocephalic and atraumatic.  Eyes: EOM are normal. Pupils are equal, round, and reactive to light.  Neck: Neck supple.  Cardiovascular: Normal rate, regular rhythm and intact distal pulses.   Pulmonary/Chest: Effort normal  and breath sounds normal. No respiratory distress.  Abdominal: Soft. Bowel sounds are normal. He exhibits no distension. There is no tenderness.  Musculoskeletal: Normal range of motion.  L flank with sutured surgical drain/ line in place, surrounding tenderness and post operative changes. No active bleeding. There is some blood soaked bandages over the wound site.   Neurological: He is alert and oriented to person, place, and time.  Skin: Skin is warm and dry.    ED Course   Procedures (including critical care time)  Results for orders placed during the hospital encounter of 03/23/13  URINALYSIS, ROUTINE W REFLEX MICROSCOPIC      Result Value Range   Color, Urine YELLOW  YELLOW   APPearance CLOUDY (*) CLEAR   Specific Gravity, Urine 1.018  1.005 - 1.030   pH 5.0  5.0 - 8.0   Glucose, UA NEGATIVE  NEGATIVE mg/dL   Hgb urine dipstick LARGE (*) NEGATIVE   Bilirubin Urine  NEGATIVE  NEGATIVE   Ketones, ur NEGATIVE  NEGATIVE mg/dL   Protein, ur 30 (*) NEGATIVE mg/dL   Urobilinogen, UA 0.2  0.0 - 1.0 mg/dL   Nitrite NEGATIVE  NEGATIVE   Leukocytes, UA MODERATE (*) NEGATIVE  CBC      Result Value Range   WBC 8.9  4.0 - 10.5 K/uL   RBC 3.99 (*) 4.22 - 5.81 MIL/uL   Hemoglobin 12.3 (*) 13.0 - 17.0 g/dL   HCT 04.5 (*) 40.9 - 81.1 %   MCV 92.2  78.0 - 100.0 fL   MCH 30.8  26.0 - 34.0 pg   MCHC 33.4  30.0 - 36.0 g/dL   RDW 91.4  78.2 - 95.6 %   Platelets 141 (*) 150 - 400 K/uL  URINE MICROSCOPIC-ADD ON      Result Value Range   Squamous Epithelial / LPF RARE  RARE   WBC, UA 7-10  <3 WBC/hpf   RBC / HPF 21-50  <3 RBC/hpf  POCT I-STAT, CHEM 8      Result Value Range   Sodium 136  135 - 145 mEq/L   Potassium 4.0  3.5 - 5.1 mEq/L   Chloride 101  96 - 112 mEq/L   BUN 23  6 - 23 mg/dL   Creatinine, Ser 2.13 (*) 0.50 - 1.35 mg/dL   Glucose, Bld 086 (*) 70 - 99 mg/dL   Calcium, Ion 5.78 (*) 1.13 - 1.30 mmol/L   TCO2 23  0 - 100 mmol/L   Hemoglobin 12.9 (*) 13.0 - 17.0 g/dL   HCT 46.9 (*) 62.9 - 52.8 %   IVFs and toradol  3:54 AM Urology consulted, d/w Dr Lucretia Roers at [facility]  Has some fever here with UA as above, plan Bactrim and OK for d/c home, has scheduled follow up Urology clinic 8/18   MDM  Post op bleeding resolved PTA and bandage has small amount of blood.  Labs and UA obtained/ reviewed  toradol and IVFs provided, has had over 1g tylenol PTA and recheck temp 98.8  U CX pending  Urology consult  VS and nurses notes reviewed  Sunnie Nielsen, MD 03/24/13 [phone]

## 2013-06-02 ENCOUNTER — Other Ambulatory Visit: Payer: Self-pay | Admitting: Dermatology

## 2013-06-11 HISTORY — PX: LYMPH NODE BIOPSY: SHX201

## 2013-07-16 ENCOUNTER — Emergency Department (HOSPITAL_COMMUNITY): Payer: Medicare Other

## 2013-07-16 ENCOUNTER — Encounter (HOSPITAL_COMMUNITY): Payer: Self-pay | Admitting: Emergency Medicine

## 2013-07-16 ENCOUNTER — Inpatient Hospital Stay (HOSPITAL_COMMUNITY)
Admission: EM | Admit: 2013-07-16 | Discharge: 2013-07-18 | DRG: 153 | Disposition: A | Discharge status: Home / Self Care | Payer: Medicare Other | Attending: Internal Medicine | Admitting: Internal Medicine

## 2013-07-16 DIAGNOSIS — F3289 Other specified depressive episodes: Secondary | ICD-10-CM | POA: Diagnosis present

## 2013-07-16 DIAGNOSIS — J9811 Atelectasis: Secondary | ICD-10-CM | POA: Diagnosis present

## 2013-07-16 DIAGNOSIS — R509 Fever, unspecified: Secondary | ICD-10-CM | POA: Diagnosis present

## 2013-07-16 DIAGNOSIS — R0600 Dyspnea, unspecified: Secondary | ICD-10-CM

## 2013-07-16 DIAGNOSIS — J9819 Other pulmonary collapse: Secondary | ICD-10-CM | POA: Diagnosis present

## 2013-07-16 DIAGNOSIS — C801 Malignant (primary) neoplasm, unspecified: Secondary | ICD-10-CM | POA: Diagnosis present

## 2013-07-16 DIAGNOSIS — E861 Hypovolemia: Secondary | ICD-10-CM | POA: Diagnosis present

## 2013-07-16 DIAGNOSIS — F329 Major depressive disorder, single episode, unspecified: Secondary | ICD-10-CM | POA: Diagnosis present

## 2013-07-16 DIAGNOSIS — J069 Acute upper respiratory infection, unspecified: Principal | ICD-10-CM | POA: Diagnosis present

## 2013-07-16 DIAGNOSIS — Z87891 Personal history of nicotine dependence: Secondary | ICD-10-CM

## 2013-07-16 DIAGNOSIS — C679 Malignant neoplasm of bladder, unspecified: Secondary | ICD-10-CM | POA: Diagnosis present

## 2013-07-16 DIAGNOSIS — Z79899 Other long term (current) drug therapy: Secondary | ICD-10-CM

## 2013-07-16 DIAGNOSIS — G4730 Sleep apnea, unspecified: Secondary | ICD-10-CM | POA: Diagnosis present

## 2013-07-16 DIAGNOSIS — E871 Hypo-osmolality and hyponatremia: Secondary | ICD-10-CM | POA: Diagnosis present

## 2013-07-16 DIAGNOSIS — R5381 Other malaise: Secondary | ICD-10-CM | POA: Diagnosis present

## 2013-07-16 DIAGNOSIS — I4892 Unspecified atrial flutter: Secondary | ICD-10-CM

## 2013-07-16 LAB — CBC WITH DIFFERENTIAL/PLATELET
Basophils Absolute: 0 10*3/uL (ref 0.0–0.1)
Basophils Relative: 0 % (ref 0–1)
Lymphocytes Relative: 6 % — ABNORMAL LOW (ref 12–46)
MCHC: 33.7 g/dL (ref 30.0–36.0)
Neutro Abs: 9.9 10*3/uL — ABNORMAL HIGH (ref 1.7–7.7)
Neutrophils Relative %: 92 % — ABNORMAL HIGH (ref 43–77)
Platelets: 154 10*3/uL (ref 150–400)
RDW: 14.1 % (ref 11.5–15.5)
WBC: 10.9 10*3/uL — ABNORMAL HIGH (ref 4.0–10.5)

## 2013-07-16 LAB — BASIC METABOLIC PANEL
CO2: 22 mEq/L (ref 19–32)
Chloride: 94 mEq/L — ABNORMAL LOW (ref 96–112)
Creatinine, Ser: 1.12 mg/dL (ref 0.50–1.35)
GFR calc Af Amer: 72 mL/min — ABNORMAL LOW (ref >90)
Potassium: 4.3 mEq/L (ref 3.5–5.1)
Sodium: 127 mEq/L — ABNORMAL LOW (ref 135–145)

## 2013-07-16 LAB — URINALYSIS, ROUTINE W REFLEX MICROSCOPIC
Glucose, UA: NEGATIVE mg/dL
Ketones, ur: NEGATIVE mg/dL
Leukocytes, UA: NEGATIVE
Nitrite: NEGATIVE
Protein, ur: NEGATIVE mg/dL

## 2013-07-16 NOTE — ED Notes (Signed)
Pt presents with the chief complaint of fever. Pt has bladder, kidney, urethral, lymph node cancer. Pt reports he developed a fever of 100.8 at home. Pt has temp on arrival of 99.1. Pt reports obtaining his last chemotherapy treatment on Thursday. Pt is A/O x4.

## 2013-07-16 NOTE — ED Provider Notes (Signed)
CSN: [id number]     Arrival date & time 07/16/13  2046 History   First MD Initiated Contact with Patient 07/16/13 2207     Chief Complaint  Patient presents with  . Fever   (Consider location/radiation/quality/duration/timing/severity/associated sxs/prior Treatment) HPI Comments: 77 year old male with history of bladder cancer started chemotherapy for the first time 2 days ago presents with a fever. States he woke up at several hours ago and had a temperature of 100.4. It rose 100.8, and he called his oncologist advised to go to the ED. He denies any dyspnea, dysuria or abd pain. No headaches or sore throat. Has maybe had some mild cough but otherwise no acute symptoms.   Past Medical History  Diagnosis Date  . Atrial flutter 05/2009    STATUS POST CARDIOVERSION   . Bladder cancer   . Sleep apnea   . Depression    Past Surgical History  Procedure Laterality Date  . Carpal tunnel release    . Bladder surgery     Family History  Problem Relation Age of Onset  . Heart failure Father   . Stroke Father   . Kidney failure Mother   . Coronary artery disease     History  Substance Use Topics  . Smoking status: Former Smoker    Quit date: 08/11/1981  . Smokeless tobacco: Not on file  . Alcohol Use: No    Review of Systems  Constitutional: Positive for fever.  Respiratory: Negative for shortness of breath.   Cardiovascular: Negative for chest pain.  Gastrointestinal: Negative for vomiting and abdominal pain.  Genitourinary: Negative for dysuria.  Neurological: Negative for weakness and headaches.    Allergies  Bupropion; Adhesive; Flomax; and Pantoprazole sodium  Home Medications   Current Outpatient Rx  Name  Route  Sig  Dispense  Refill  . dexamethasone (DECADRON) 4 MG tablet   Oral   Take 4 mg by mouth 2 (two) times daily with a meal. On days 2, 3, 9, and 10 of cycle following chemo  ( day 1 of cycle was 12.4.14)         . lidocaine-prilocaine (EMLA) cream  Topical   Apply 1 application topically as needed (for port).         . ondansetron (ZOFRAN) 8 MG tablet   Oral   Take 8 mg by mouth every 8 (eight) hours as needed for nausea or vomiting.         . sertraline (ZOLOFT) 100 MG tablet   Oral   Take 150 mg by mouth every morning.          . venlafaxine XR (EFFEXOR-XR) 150 MG 24 hr capsule   Oral   Take 150 mg by mouth daily with breakfast.           BP 131/61  Pulse 77  Temp(Src) 100.7 F (38.2 C) (Oral)  Resp 16  Ht 5\' 9"  (1.753 m)  Wt 250 lb (113.399 kg)  BMI 36.90 kg/m2  SpO2 97% Physical Exam  Nursing note and vitals reviewed. Constitutional: He is oriented to person, place, and time. He appears well-developed and well-nourished. No distress.  HENT:  Head: Normocephalic and atraumatic.  Right Ear: External ear normal.  Left Ear: External ear normal.  Nose: Nose normal.  Eyes: Right eye exhibits no discharge. Left eye exhibits no discharge.  Neck: Neck supple.  Cardiovascular: Normal rate, regular rhythm, normal heart sounds and intact distal pulses.   Pulmonary/Chest: Effort normal and breath sounds normal. He  has no wheezes.  Port in place without erythema or fluctuance  Abdominal: Soft. He exhibits no distension. There is no tenderness.  Musculoskeletal: He exhibits no edema.  Neurological: He is alert and oriented to person, place, and time.  Skin: Skin is warm and dry.    ED Course  Procedures (including critical care time) Labs Review Labs Reviewed  CBC WITH DIFFERENTIAL - Abnormal; Notable for the following:    WBC 10.9 (*)    RBC 4.19 (*)    Hemoglobin 12.6 (*)    HCT 37.4 (*)    Neutrophils Relative % 92 (*)    Neutro Abs 9.9 (*)    Lymphocytes Relative 6 (*)    Lymphs Abs 0.6 (*)    All other components within normal limits  BASIC METABOLIC PANEL - Abnormal; Notable for the following:    Sodium 127 (*)    Chloride 94 (*)    Glucose, Bld 112 (*)    BUN 25 (*)    GFR calc non Af Amer 62  (*)    GFR calc Af Amer 72 (*)    All other components within normal limits  CULTURE, BLOOD (ROUTINE X 2)  CULTURE, BLOOD (ROUTINE X 2)  URINE CULTURE  URINALYSIS, ROUTINE W REFLEX MICROSCOPIC  CG4 I-STAT (LACTIC ACID)   Imaging Review Dg Chest 2 View  07/16/2013   CLINICAL DATA:  Status post first round of chemotherapy for bladder cancer; fatigue and decreased appetite. Fever.  EXAM: CHEST  2 VIEW  COMPARISON:  Chest radiograph performed 03/31/2009  FINDINGS: The lungs are well-aerated. Mild vascular congestion is noted. Mild left basilar opacity may reflect atelectasis or less likely mild pneumonia, depending on the patient's symptoms. There is no evidence of pleural effusion or pneumothorax. A right-sided chest port is noted ending about the cavoatrial junction.  The heart is normal in size; the mediastinal contour is within normal limits. No acute osseous abnormalities are seen.  IMPRESSION: 1. Mild left basilar opacity may reflect atelectasis or less likely mild pneumonia, depending on the patient's symptoms. 2. Mild vascular congestion noted.   Electronically Signed   By: Roanna Raider M.D.   On: 07/16/2013 22:54    EKG Interpretation   None       MDM   1. Fever    Patient appears well, has benign vitals. Xray possible PNA, given his fever and immunocompromise will cover for CAP. D/w oncologist on call at [facility], recommends normal PNA treatment given he is not neutropenic.     Audree Camel, MD 07/17/13 0100

## 2013-07-16 NOTE — ED Notes (Signed)
Pt states had first round of chemo Dec. 4, started feeling fatigued today and decreased appetite, but was told these were side effects of the chemo he would begin feeling by day 2. Pt developed fever of 100.8 at home, fever is now 100.7. Pt denies nausea or pain, no new sx.

## 2013-07-17 ENCOUNTER — Inpatient Hospital Stay (HOSPITAL_COMMUNITY): Payer: Medicare Other

## 2013-07-17 DIAGNOSIS — C679 Malignant neoplasm of bladder, unspecified: Secondary | ICD-10-CM

## 2013-07-17 DIAGNOSIS — F3289 Other specified depressive episodes: Secondary | ICD-10-CM

## 2013-07-17 DIAGNOSIS — E871 Hypo-osmolality and hyponatremia: Secondary | ICD-10-CM | POA: Diagnosis present

## 2013-07-17 DIAGNOSIS — J9819 Other pulmonary collapse: Secondary | ICD-10-CM

## 2013-07-17 DIAGNOSIS — F329 Major depressive disorder, single episode, unspecified: Secondary | ICD-10-CM

## 2013-07-17 DIAGNOSIS — J9811 Atelectasis: Secondary | ICD-10-CM | POA: Diagnosis present

## 2013-07-17 DIAGNOSIS — R509 Fever, unspecified: Secondary | ICD-10-CM

## 2013-07-17 LAB — BASIC METABOLIC PANEL
BUN: 27 mg/dL — ABNORMAL HIGH (ref 6–23)
CO2: 26 mEq/L (ref 19–32)
Calcium: 8.6 mg/dL (ref 8.4–10.5)
Chloride: 99 mEq/L (ref 96–112)
Sodium: 134 mEq/L — ABNORMAL LOW (ref 135–145)

## 2013-07-17 LAB — CBC
HCT: 36.4 % — ABNORMAL LOW (ref 39.0–52.0)
MCH: 31 pg (ref 26.0–34.0)
Platelets: 162 10*3/uL (ref 150–400)
RBC: 4.03 MIL/uL — ABNORMAL LOW (ref 4.22–5.81)
WBC: 8.7 10*3/uL (ref 4.0–10.5)

## 2013-07-17 LAB — INFLUENZA PANEL BY PCR (TYPE A & B)
H1N1 flu by pcr: NOT DETECTED
Influenza A By PCR: NEGATIVE
Influenza B By PCR: NEGATIVE

## 2013-07-17 MED ORDER — CEFTRIAXONE SODIUM 1 G IJ SOLR
1.0000 g | INTRAMUSCULAR | Status: DC
  Administered 2013-07-18: 1 g via INTRAVENOUS
  Filled 2013-07-17: qty 10

## 2013-07-17 MED ORDER — DEXTROSE 5 % IV SOLN: 500.0000 mg | Freq: Once | INTRAVENOUS | Status: DC

## 2013-07-17 MED ORDER — ENOXAPARIN SODIUM 60 MG/0.6ML ~~LOC~~ SOLN
55.0000 mg | SUBCUTANEOUS | Status: DC
  Administered 2013-07-17: 55 mg via SUBCUTANEOUS
  Filled 2013-07-17 (×2): qty 0.6

## 2013-07-17 MED ORDER — SODIUM CHLORIDE 0.9 % IV SOLN
INTRAVENOUS | Status: DC
  Administered 2013-07-17: 17:00:00 via INTRAVENOUS

## 2013-07-17 MED ORDER — VENLAFAXINE HCL ER 150 MG PO CP24
150.0000 mg | ORAL_CAPSULE | Freq: Every day | ORAL | Status: DC
  Administered 2013-07-17 – 2013-07-18 (×2): 150 mg via ORAL
  Filled 2013-07-17 (×2): qty 1

## 2013-07-17 MED ORDER — DEXTROSE 5 % IV SOLN
1.0000 g | Freq: Once | INTRAVENOUS | Status: DC
  Administered 2013-07-17: 1 g via INTRAVENOUS
  Filled 2013-07-17: qty 10

## 2013-07-17 MED ORDER — ONDANSETRON HCL 4 MG/2ML IJ SOLN
4.0000 mg | Freq: Four times a day (QID) | INTRAMUSCULAR | Status: DC | PRN
  Administered 2013-07-17 (×2): 4 mg via INTRAVENOUS
  Filled 2013-07-17 (×2): qty 2

## 2013-07-17 MED ORDER — SERTRALINE HCL 50 MG PO TABS
150.0000 mg | ORAL_TABLET | Freq: Every day | ORAL | Status: DC
  Administered 2013-07-17 – 2013-07-18 (×2): 150 mg via ORAL
  Filled 2013-07-17 (×2): qty 1

## 2013-07-17 MED ORDER — DEXTROSE 5 % IV SOLN
500.0000 mg | INTRAVENOUS | Status: DC
  Administered 2013-07-17 – 2013-07-18 (×2): 500 mg via INTRAVENOUS
  Filled 2013-07-17 (×3): qty 500

## 2013-07-17 MED ORDER — ACETAMINOPHEN 325 MG PO TABS: 650.0000 mg | ORAL_TABLET | Freq: Four times a day (QID) | ORAL | Status: DC | PRN

## 2013-07-17 NOTE — ED Notes (Signed)
Hospitalist at bedside 

## 2013-07-17 NOTE — Progress Notes (Signed)
Patient seen and examined. Admitted earlier today for fever of 100.7. Is currently undergoing chemo for his metastatic bladder cancer. Repeat CXR negative for signs of infection, UA negative, cx data is negative to date. Wonder if this may have been a viral URI? Continue rocephin/azithro for now. Follow fever curve.  Peggye Pitt, MD [facility] Pager: [phone]

## 2013-07-17 NOTE — H&P (Signed)
[facility] History and Physical  KEONTRE DEFINO WUJ:[id number] DOB: Jan 10, 1936 DOA: 07/16/2013  Referring physician: EDP PCP: Londell Moh, MD  Specialists:Onc Dr.Chris Maisie Fus at [facility] Complaint: fever  HPI: James [name] is a 77 y.o. male with medical history of metastatic bladder cancer, depression, followed by Urology and oncology at [facility], just had a port placed on Tuesday 12/2, then started chemotherapy, first cycle on Thursday 12/4, he felt well yesterday, but today he felt very tired, had aches and pains and noted a fever up to 100.8 and chills. In addition he also noticed itching in his throat, and some cough while attempting to clear his throat. He denies any congestion, shortness of breath, wheezing. He denies nausea vomiting abdominal pain diarrhea dysuria or urinary frequency No headache photophobia and neck stiffness no rashes He called the on-call doctor with the oncology group at [facility] and was advised to come to the emergency room for further evaluation. In the ER noted to have a temp of 100.7, rest of vitals normal, sodium 127 and white count 10.9.   Review of Systems: The patient denies anorexia, fever, weight loss,, vision loss, decreased hearing, hoarseness, chest pain, syncope, dyspnea on exertion, peripheral edema, balance deficits, hemoptysis, abdominal pain, melena, hematochezia, severe indigestion/heartburn, hematuria, incontinence, genital sores, muscle weakness, suspicious skin lesions, transient blindness, difficulty walking, depression, unusual weight change, abnormal bleeding, enlarged lymph nodes, angioedema, and breast masses.    Past Medical History  Diagnosis Date  . Atrial flutter 05/2009    STATUS POST CARDIOVERSION   . Bladder cancer   . Sleep apnea   . Depression    Past Surgical History  Procedure Laterality Date  . Carpal tunnel release    . Bladder surgery     Social History:  reports that he quit smoking about 31  years ago. He does not have any smokeless tobacco history on file. He reports that he does not drink alcohol or use illicit drugs. Lives at home with wife, independent in all ADLs  Allergies  Allergen Reactions  . Bupropion Hives  . Adhesive [Tape] Itching and Rash  . Flomax [Tamsulosin Hcl] Rash    When taken with protonix only patient can take this alone  . Pantoprazole Sodium Rash    When taken with flomax only patient can take this alone    Family History  Problem Relation Age of Onset  . Heart failure Father   . Stroke Father   . Kidney failure Mother   . Coronary artery disease      Prior to Admission medications   Medication Sig Start Date End Date Taking? Authorizing Provider  dexamethasone (DECADRON) 4 MG tablet Take 4 mg by mouth 2 (two) times daily with a meal. On days 2, 3, 9, and 10 of cycle following chemo  ( day 1 of cycle was 12.4.14)   Yes Historical Provider, MD  lidocaine-prilocaine (EMLA) cream Apply 1 application topically as needed (for port).   Yes Historical Provider, MD  ondansetron (ZOFRAN) 8 MG tablet Take 8 mg by mouth every 8 (eight) hours as needed for nausea or vomiting.   Yes Historical Provider, MD  sertraline (ZOLOFT) 100 MG tablet Take 150 mg by mouth every morning.    Yes Historical Provider, MD  venlafaxine XR (EFFEXOR-XR) 150 MG 24 hr capsule Take 150 mg by mouth daily with breakfast.    Yes Historical Provider, MD   Physical Exam: Filed Vitals:   07/16/13 2121  BP:   Pulse:  Temp: 100.7 F (38.2 C)  Resp:      General:  Obese elderly gentleman, laying in a stretcher in no distress  HEENT: PERRLA, EOMI, oral mucosa moist and pink, mild pharyngeal erythema  Chest: Port noted  Mild ecchymosis noted in the right upper chest  CVS S1-S2 regular rate rhythm no murmurs rubs or gallops  Lungs clear auscultation bilaterally  Abdomen soft obese nontender with normal bowel sounds no organomegaly  Extremities no edema clubbing or  cyanosis  Skin no rashes or breakdown eccentric at appropriate in affect Neuro nonfocal Labs on Admission:  Basic Metabolic Panel:  Recent Labs Lab 07/16/13 2235  NA 127*  K 4.3  CL 94*  CO2 22  GLUCOSE 112*  BUN 25*  CREATININE 1.12  CALCIUM 8.8   Liver Function Tests: No results found for this basename: AST, ALT, ALKPHOS, BILITOT, PROT, ALBUMIN,  in the last 168 hours No results found for this basename: LIPASE, AMYLASE,  in the last 168 hours No results found for this basename: AMMONIA,  in the last 168 hours CBC:  Recent Labs Lab 07/16/13 2235  WBC 10.9*  NEUTROABS 9.9*  HGB 12.6*  HCT 37.4*  MCV 89.3  PLT 154   Cardiac Enzymes: No results found for this basename: CKTOTAL, CKMB, CKMBINDEX, TROPONINI,  in the last 168 hours  BNP (last 3 results) No results found for this basename: PROBNP,  in the last 8760 hours CBG: No results found for this basename: GLUCAP,  in the last 168 hours  Radiological Exams on Admission: Dg Chest 2 View  07/16/2013   CLINICAL DATA:  Status post first round of chemotherapy for bladder cancer; fatigue and decreased appetite. Fever.  EXAM: CHEST  2 VIEW  COMPARISON:  Chest radiograph performed 03/31/2009  FINDINGS: The lungs are well-aerated. Mild vascular congestion is noted. Mild left basilar opacity may reflect atelectasis or less likely mild pneumonia, depending on the patient's symptoms. There is no evidence of pleural effusion or pneumothorax. A right-sided chest port is noted ending about the cavoatrial junction.  The heart is normal in size; the mediastinal contour is within normal limits. No acute osseous abnormalities are seen.  IMPRESSION: 1. Mild left basilar opacity may reflect atelectasis or less likely mild pneumonia, depending on the patient's symptoms. 2. Mild vascular congestion noted.   Electronically Signed   By: Roanna Raider M.D.   On: 07/16/2013 22:54     Assessment/Plan  1. Fever -Differential includes viral URI,  early pneumonia versus atelectasis -Start IV ceftriaxone and azithromycin -I do not think he needs HCAP Coverage -Check influenza PCR -Repeat 2 view chest x-ray in a.m. 8 hours after hydration -Follow up blood culture -Incentive spirometry-  2. 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 Carren Rang at [facility]  3. Hyponatremia -Likely hypovolemia related -Hydrate and monitor  4. depression  -Continue home regimen  DVT prophylaxis: Lovenox  Code Status: Full code Family Communication: Discussed with patient and wife at bedside Disposition Plan: Inpatient  Time spent: 50 minutes  [facility] Pager [phone]  If 7PM-7AM, please contact night-coverage [email or web address] Password [email or web address] 07/17/2013, 1:09 AM

## 2013-07-17 NOTE — Progress Notes (Signed)
RT placed patient on CPAP on home setting of 16 cmH2O. Patient is tolerating well at this time. Will continue to monitor.

## 2013-07-17 NOTE — ED Notes (Signed)
Waiting for room on floor to be cleaned

## 2013-07-18 LAB — CBC
Hemoglobin: 12.6 g/dL — ABNORMAL LOW (ref 13.0–17.0)
MCH: 30.5 pg (ref 26.0–34.0)
Platelets: 143 10*3/uL — ABNORMAL LOW (ref 150–400)
RBC: 4.13 MIL/uL — ABNORMAL LOW (ref 4.22–5.81)

## 2013-07-18 LAB — BASIC METABOLIC PANEL
Creatinine, Ser: 1.18 mg/dL (ref 0.50–1.35)
GFR calc non Af Amer: 59 mL/min — ABNORMAL LOW (ref >90)
Glucose, Bld: 93 mg/dL (ref 70–99)
Potassium: 4.3 mEq/L (ref 3.5–5.1)
Sodium: 134 mEq/L — ABNORMAL LOW (ref 135–145)

## 2013-07-18 MED ORDER — HEPARIN SOD (PORK) LOCK FLUSH 100 UNIT/ML IV SOLN
500.0000 [IU] | Freq: Once | INTRAVENOUS | Status: AC
  Administered 2013-07-18: 500 [IU] via INTRAVENOUS
  Filled 2013-07-18: qty 5

## 2013-07-18 MED ORDER — LEVOFLOXACIN 750 MG PO TABS: 750.0000 mg | ORAL_TABLET | Freq: Every day | ORAL | Status: DC

## 2013-07-18 NOTE — Care Management Note (Signed)
   CARE MANAGEMENT NOTE 07/18/2013  Patient:  James [name], James [name]   Account Number:  [id number]  Date Initiated:  07/18/2013  Documentation initiated by:  Jalie Eiland  Subjective/Objective Assessment:   77 yo male admitted with fever.     Action/Plan:   Home when stable   Anticipated DC Date:  07/18/2013   Anticipated DC Plan:        DC Planning Services  CM consult      Choice offered to / List presented to:             Status of service:  Completed, signed off Medicare Important Message given?   (If response is "NO", the following Medicare IM given date fields will be blank) Date Medicare IM given:   Date Additional Medicare IM given:    Discharge Disposition:    Per UR Regulation:  Reviewed for med. necessity/level of care/duration of stay  If discussed at Long Length of Stay Meetings, dates discussed:    Comments:  07/18/13 1354 Mahir Prabhakar,MSN,RN [phone] chart reviewed for utilization of services. No needs identified prior to dc.

## 2013-07-18 NOTE — Progress Notes (Signed)
Pt. Requested spiritual consult. Chaplain visited pt and  provided pastoral care, a listening ear and prayer. Pt. Is being discharged today and is in much better spirits and acceptance of his condition.

## 2013-07-18 NOTE — Discharge Summary (Signed)
Physician Discharge Summary  James [name] EAV:[id number] DOB: 07-28-1936 DOA: 07/16/2013  PCP: Londell Moh, MD  Admit date: 07/16/2013 Discharge date: 07/18/2013  Time spent: 45 minutes  Recommendations for Outpatient Follow-up:  -Will be discharged home today. -Advised to follow up with PCP in 2 weeks.   Discharge Diagnoses:  Active Problems:   Bladder cancer   Fever   Hyponatremia   Atelectasis   Discharge Condition: Stable and improved.  Filed Weights   07/16/13 2102  Weight: 113.399 kg (250 lb)    History of present illness:  James [name] is a 77 y.o. male with medical history of metastatic bladder cancer, depression, followed by Urology and oncology at [facility], just had a port placed on Tuesday 12/2, then started chemotherapy, first cycle on Thursday 12/4, he felt well yesterday, but today he felt very tired, had aches and pains and noted a fever up to 100.8 and chills.  In addition he also noticed itching in his throat, and some cough while attempting to clear his throat.  He denies any congestion, shortness of breath, wheezing.  He denies nausea vomiting abdominal pain diarrhea dysuria or urinary frequency  No headache photophobia and neck stiffness no rashes  He called the on-call doctor with the oncology group at [facility] and was advised to come to the emergency room for further evaluation.  In the ER noted to have a temp of 100.7, rest of vitals normal, sodium 127 and white count 10.9. We were asked to admit him for further evaluation and management.   Hospital Course:   Fever -Etiology remains unclear. -CXR x 2, u/a WNL. -All cx data remains negative to date. -Given his URI symptoms, suspect this was probably a viral URI. -Given his immune-compromised state, will send home with 5 days of empiric levaquin.  Hyponatremia -Resolved with IVF. -Na is 134 on DC.  Stage IV Bladder Cancer -Continue OP follow up at [facility] as scheduled.  Procedures:  None    Consultations:  None  Discharge Instructions  Discharge Orders   Future Orders Complete By Expires   Discontinue IV  As directed    Increase activity slowly  As directed        Medication List         dexamethasone 4 MG tablet  Commonly known as:  DECADRON  Take 4 mg by mouth 2 (two) times daily with a meal. On days 2, 3, 9, and 10 of cycle following chemo  ( day 1 of cycle was 12.4.14)     levofloxacin 750 MG tablet  Commonly known as:  LEVAQUIN  Take 1 tablet (750 mg total) by mouth daily. For 5 days     lidocaine-prilocaine cream  Commonly known as:  EMLA  Apply 1 application topically as needed (for port).     ondansetron 8 MG tablet  Commonly known as:  ZOFRAN  Take 8 mg by mouth every 8 (eight) hours as needed for nausea or vomiting.     sertraline 100 MG tablet  Commonly known as:  ZOLOFT  Take 150 mg by mouth every morning.     venlafaxine XR 150 MG 24 hr capsule  Commonly known as:  EFFEXOR-XR  Take 150 mg by mouth daily with breakfast.       Allergies  Allergen Reactions  . Bupropion Hives  . Adhesive [Tape] Itching and Rash  . Flomax [Tamsulosin Hcl] Rash    When taken with protonix only patient can take this alone  . Pantoprazole  Sodium Rash    When taken with flomax only patient can take this alone       Follow-up Information   Follow up with Londell Moh, MD. Schedule an appointment as soon as possible for a visit in 2 weeks.   Specialty:  Internal Medicine   Contact information:   [address] [place] [address] [place] [address] [phone]        The results of significant diagnostics from this hospitalization (including imaging, microbiology, ancillary and laboratory) are listed below for reference.    Significant Diagnostic Studies: Dg Chest 2 View  07/17/2013   CLINICAL DATA:  History of bladder malignancy on chemotherapy now with 1 day history of fever, previous history of tobacco use.  EXAM: CHEST  2 VIEW   COMPARISON:  Chest x-ray dated dated July 16, 2013, and March 31, 2009  FINDINGS: The lungs are mildly hyperinflated. There is hazy density in the region of the lingula on the frontal film which is not clearly evident on the lateral film. There is a trace of blunting of the posterior costophrenic angle on the left. There is minimally increased density in the lung parenchyma on the right in the infrahilar region above the hemidiaphragm without obscure a shin. This is slightly less conspicuous than on today's earlier study. On the lateral film no definite right middle lobe or lower lobe infiltrate is suspected. The cardiopericardial silhouette is normal in size. The pulmonary vascularity is not engorged. An 35 Port-A-Cath type appliance is in place with the tip in the region of the distal SVC. There is tortuosity of the descending thoracic aorta which is stable. There are mild degenerative disc changes of the thoracic spine.  IMPRESSION: There is subtle density obscuring the dome of the left hemidiaphragm the suggests lingular atelectasis. There may be a trace of pleural fluid on the left is well. Minimal increased infrahilar lung markings on the right are present but are of questionable significance.   Electronically Signed   By: David  [place]   On: 07/17/2013 09:17   Dg Chest 2 View  07/16/2013   CLINICAL DATA:  Status post first round of chemotherapy for bladder cancer; fatigue and decreased appetite. Fever.  EXAM: CHEST  2 VIEW  COMPARISON:  Chest radiograph performed 03/31/2009  FINDINGS: The lungs are well-aerated. Mild vascular congestion is noted. Mild left basilar opacity may reflect atelectasis or less likely mild pneumonia, depending on the patient's symptoms. There is no evidence of pleural effusion or pneumothorax. A right-sided chest port is noted ending about the cavoatrial junction.  The heart is normal in size; the mediastinal contour is within normal limits. No acute osseous abnormalities are  seen.  IMPRESSION: 1. Mild left basilar opacity may reflect atelectasis or less likely mild pneumonia, depending on the patient's symptoms. 2. Mild vascular congestion noted.   Electronically Signed   By: Roanna Raider M.D.   On: 07/16/2013 22:54    Microbiology: Recent Results (from the past 240 hour(s))  URINE CULTURE     Status: None   Collection Time    07/16/13 10:03 PM      Result Value Range Status   Specimen Description URINE, CLEAN CATCH   Final   Special Requests Immunocompromised   Final   Culture  Setup Time     Final   Value: 07/17/2013 05:43     Performed at [facility] Count PENDING   Incomplete   Culture     Final  Value: Culture reincubated for better growth     Performed at [facility]   Report Status PENDING   Incomplete  CULTURE, BLOOD (ROUTINE X 2)     Status: None   Collection Time    07/16/13 10:09 PM      Result Value Range Status   Specimen Description BLOOD LEFT ARM   Final   Special Requests Immunocompromised 5CC 5CC    Final   Culture  Setup Time     Final   Value: 07/17/2013 05:03     Performed at [facility]   Culture     Final   Value:        BLOOD CULTURE RECEIVED NO GROWTH TO DATE CULTURE WILL BE HELD FOR 5 DAYS BEFORE ISSUING A FINAL NEGATIVE REPORT     Performed at [facility]   Report Status PENDING   Incomplete  CULTURE, BLOOD (ROUTINE X 2)     Status: None   Collection Time    07/16/13 10:30 PM      Result Value Range Status   Specimen Description BLOOD LEFT ARM   Final   Special Requests BOTTLES DRAWN AEROBIC AND ANAEROBIC 3CC   Final   Culture  Setup Time     Final   Value: 07/17/2013 05:03     Performed at [facility]   Culture     Final   Value:        BLOOD CULTURE RECEIVED NO GROWTH TO DATE CULTURE WILL BE HELD FOR 5 DAYS BEFORE ISSUING A FINAL NEGATIVE REPORT     Performed at [facility]   Report Status PENDING   Incomplete     Labs: Basic Metabolic  Panel:  Recent Labs Lab 07/16/13 2235 07/17/13 0500 07/18/13 0630  NA 127* 134* 134*  K 4.3 4.2 4.3  CL 94* 99 99  CO2 22 26 26   GLUCOSE 112* 83 93  BUN 25* 27* 21  CREATININE 1.12 1.27 1.18  CALCIUM 8.8 8.6 8.6   Liver Function Tests: No results found for this basename: AST, ALT, ALKPHOS, BILITOT, PROT, ALBUMIN,  in the last 168 hours No results found for this basename: LIPASE, AMYLASE,  in the last 168 hours No results found for this basename: AMMONIA,  in the last 168 hours CBC:  Recent Labs Lab 07/16/13 2235 07/17/13 0500 07/18/13 0630  WBC 10.9* 8.7 5.4  NEUTROABS 9.9*  --   --   HGB 12.6* 12.5* 12.6*  HCT 37.4* 36.4* 37.5*  MCV 89.3 90.3 90.8  PLT 154 162 143*   Cardiac Enzymes: No results found for this basename: CKTOTAL, CKMB, CKMBINDEX, TROPONINI,  in the last 168 hours BNP: BNP (last 3 results) No results found for this basename: PROBNP,  in the last 8760 hours CBG: No results found for this basename: GLUCAP,  in the last 168 hours     Signed:  Chaya Jan  [facility] Pager: [phone] 07/18/2013, 9:21 AM

## 2013-07-18 NOTE — Progress Notes (Signed)
Pt discharged home via family; Pt and family given and explained all discharge instructions, carenotes, and prescriptions; pt and family stated understanding and denied questions/concerns; all f/u appointments in place; IV removed without complicaitons; pt stable at time of discharge  

## 2013-07-20 LAB — URINE CULTURE: Colony Count: 50000

## 2013-07-23 LAB — CULTURE, BLOOD (ROUTINE X 2): Culture: NO GROWTH

## 2013-08-02 ENCOUNTER — Emergency Department (HOSPITAL_COMMUNITY): Payer: Medicare Other

## 2013-08-02 ENCOUNTER — Encounter (HOSPITAL_COMMUNITY): Payer: Self-pay | Admitting: Emergency Medicine

## 2013-08-02 ENCOUNTER — Inpatient Hospital Stay (HOSPITAL_COMMUNITY)
Admission: EM | Admit: 2013-08-02 | Discharge: 2013-08-04 | DRG: 249 | Disposition: A | Discharge status: Home / Self Care | Payer: Medicare Other | Attending: Cardiovascular Disease | Admitting: Cardiovascular Disease

## 2013-08-02 DIAGNOSIS — F329 Major depressive disorder, single episode, unspecified: Secondary | ICD-10-CM | POA: Diagnosis present

## 2013-08-02 DIAGNOSIS — R079 Chest pain, unspecified: Secondary | ICD-10-CM | POA: Diagnosis present

## 2013-08-02 DIAGNOSIS — C679 Malignant neoplasm of bladder, unspecified: Secondary | ICD-10-CM | POA: Diagnosis present

## 2013-08-02 DIAGNOSIS — G4730 Sleep apnea, unspecified: Secondary | ICD-10-CM

## 2013-08-02 DIAGNOSIS — I214 Non-ST elevation (NSTEMI) myocardial infarction: Principal | ICD-10-CM | POA: Diagnosis present

## 2013-08-02 DIAGNOSIS — R509 Fever, unspecified: Secondary | ICD-10-CM

## 2013-08-02 DIAGNOSIS — E871 Hypo-osmolality and hyponatremia: Secondary | ICD-10-CM

## 2013-08-02 DIAGNOSIS — C787 Secondary malignant neoplasm of liver and intrahepatic bile duct: Secondary | ICD-10-CM | POA: Diagnosis present

## 2013-08-02 DIAGNOSIS — J9819 Other pulmonary collapse: Secondary | ICD-10-CM

## 2013-08-02 DIAGNOSIS — J9811 Atelectasis: Secondary | ICD-10-CM

## 2013-08-02 DIAGNOSIS — Z8249 Family history of ischemic heart disease and other diseases of the circulatory system: Secondary | ICD-10-CM

## 2013-08-02 DIAGNOSIS — K219 Gastro-esophageal reflux disease without esophagitis: Secondary | ICD-10-CM | POA: Diagnosis present

## 2013-08-02 DIAGNOSIS — I4892 Unspecified atrial flutter: Secondary | ICD-10-CM | POA: Diagnosis present

## 2013-08-02 DIAGNOSIS — Z79899 Other long term (current) drug therapy: Secondary | ICD-10-CM

## 2013-08-02 DIAGNOSIS — G4733 Obstructive sleep apnea (adult) (pediatric): Secondary | ICD-10-CM | POA: Diagnosis present

## 2013-08-02 DIAGNOSIS — Z9221 Personal history of antineoplastic chemotherapy: Secondary | ICD-10-CM

## 2013-08-02 DIAGNOSIS — F3289 Other specified depressive episodes: Secondary | ICD-10-CM | POA: Diagnosis present

## 2013-08-02 DIAGNOSIS — R0600 Dyspnea, unspecified: Secondary | ICD-10-CM

## 2013-08-02 DIAGNOSIS — Z87891 Personal history of nicotine dependence: Secondary | ICD-10-CM

## 2013-08-02 DIAGNOSIS — I2510 Atherosclerotic heart disease of native coronary artery without angina pectoris: Secondary | ICD-10-CM | POA: Diagnosis present

## 2013-08-02 HISTORY — DX: Dependence on other enabling machines and devices: Z99.89

## 2013-08-02 HISTORY — DX: Pneumonia, unspecified organism: J18.9

## 2013-08-02 HISTORY — DX: Unspecified osteoarthritis, unspecified site: M19.90

## 2013-08-02 HISTORY — DX: Personal history of other medical treatment: Z92.89

## 2013-08-02 HISTORY — DX: Obstructive sleep apnea (adult) (pediatric): G47.33

## 2013-08-02 LAB — CBC WITH DIFFERENTIAL/PLATELET
Basophils Absolute: 0 10*3/uL (ref 0.0–0.1)
Eosinophils Absolute: 0 10*3/uL (ref 0.0–0.7)
Eosinophils Relative: 0 % (ref 0–5)
HCT: 34.6 % — ABNORMAL LOW (ref 39.0–52.0)
Lymphocytes Relative: 37 % (ref 12–46)
MCH: 31 pg (ref 26.0–34.0)
MCHC: 34.1 g/dL (ref 30.0–36.0)
MCV: 90.8 fL (ref 78.0–100.0)
Monocytes Absolute: 0 10*3/uL — ABNORMAL LOW (ref 0.1–1.0)
Platelets: 230 10*3/uL (ref 150–400)
RBC: 3.81 MIL/uL — ABNORMAL LOW (ref 4.22–5.81)
RDW: 14.1 % (ref 11.5–15.5)

## 2013-08-02 LAB — PRO B NATRIURETIC PEPTIDE: Pro B Natriuretic peptide (BNP): 79.7 pg/mL (ref 0–450)

## 2013-08-02 LAB — COMPREHENSIVE METABOLIC PANEL
ALT: 28 U/L (ref 0–53)
AST: 23 U/L (ref 0–37)
BUN: 26 mg/dL — ABNORMAL HIGH (ref 6–23)
CO2: 26 mEq/L (ref 19–32)
Calcium: 8.2 mg/dL — ABNORMAL LOW (ref 8.4–10.5)
Creatinine, Ser: 1.31 mg/dL (ref 0.50–1.35)
GFR calc Af Amer: 60 mL/min — ABNORMAL LOW (ref >90)
GFR calc non Af Amer: 52 mL/min — ABNORMAL LOW (ref >90)
Potassium: 4.3 mEq/L (ref 3.5–5.1)
Sodium: 135 mEq/L (ref 135–145)
Total Protein: 6.8 g/dL (ref 6.0–8.3)

## 2013-08-02 LAB — PROTIME-INR
INR: 0.95 (ref 0.00–1.49)
Prothrombin Time: 12.5 seconds (ref 11.6–15.2)

## 2013-08-02 LAB — TROPONIN I: Troponin I: <0.3 ng/mL (ref <0.30)

## 2013-08-02 LAB — LIPID PANEL
Cholesterol: 129 mg/dL (ref 0–200)
LDL Cholesterol: 71 mg/dL (ref 0–99)
Triglycerides: 103 mg/dL (ref <150)
VLDL: 21 mg/dL (ref 0–40)

## 2013-08-02 LAB — D-DIMER, QUANTITATIVE: D-Dimer, Quant: 1.09 ug/mL-FEU — ABNORMAL HIGH (ref 0.00–0.48)

## 2013-08-02 LAB — CK TOTAL AND CKMB (NOT AT ARMC)
CK, MB: 13.1 ng/mL (ref 0.3–4.0)
Relative Index: 5.8 — ABNORMAL HIGH (ref 0.0–2.5)
Total CK: 227 U/L (ref 7–232)

## 2013-08-02 MED ORDER — IOHEXOL 350 MG/ML SOLN
100.0000 mL | Freq: Once | INTRAVENOUS | Status: AC | PRN
  Administered 2013-08-02: 100 mL via INTRAVENOUS

## 2013-08-02 MED ORDER — NITROGLYCERIN 0.2 MG/HR TD PT24
0.2000 mg | MEDICATED_PATCH | TRANSDERMAL | Status: DC
  Administered 2013-08-02: 0.2 mg via TRANSDERMAL
  Filled 2013-08-02 (×3): qty 1

## 2013-08-02 MED ORDER — ACETAMINOPHEN 325 MG PO TABS: 650.0000 mg | ORAL_TABLET | ORAL | Status: DC | PRN

## 2013-08-02 MED ORDER — ONDANSETRON HCL 4 MG/2ML IJ SOLN: 4.0000 mg | Freq: Four times a day (QID) | INTRAMUSCULAR | Status: DC | PRN

## 2013-08-02 MED ORDER — HEPARIN (PORCINE) IN NACL 100-0.45 UNIT/ML-% IJ SOLN
1350.0000 [IU]/h | INTRAMUSCULAR | Status: DC
  Administered 2013-08-03: 1350 [IU]/h via INTRAVENOUS
  Filled 2013-08-02 (×2): qty 250

## 2013-08-02 MED ORDER — SERTRALINE HCL 50 MG PO TABS
150.0000 mg | ORAL_TABLET | ORAL | Status: DC
  Administered 2013-08-04: 06:00:00 150 mg via ORAL
  Filled 2013-08-02 (×4): qty 1

## 2013-08-02 MED ORDER — GI COCKTAIL ~~LOC~~
30.0000 mL | Freq: Four times a day (QID) | ORAL | Status: DC | PRN
  Filled 2013-08-02: qty 30

## 2013-08-02 MED ORDER — LIDOCAINE-PRILOCAINE 2.5-2.5 % EX CREA
1.0000 "application " | TOPICAL_CREAM | CUTANEOUS | Status: DC | PRN
  Filled 2013-08-02: qty 5

## 2013-08-02 MED ORDER — ASPIRIN EC 325 MG PO TBEC
325.0000 mg | DELAYED_RELEASE_TABLET | Freq: Every day | ORAL | Status: DC
  Administered 2013-08-02 – 2013-08-04 (×3): 325 mg via ORAL
  Filled 2013-08-02 (×3): qty 1

## 2013-08-02 MED ORDER — VENLAFAXINE HCL ER 150 MG PO CP24
150.0000 mg | ORAL_CAPSULE | Freq: Every day | ORAL | Status: DC
  Filled 2013-08-02 (×3): qty 1

## 2013-08-02 MED ORDER — ENOXAPARIN SODIUM 40 MG/0.4ML ~~LOC~~ SOLN
40.0000 mg | SUBCUTANEOUS | Status: DC
  Administered 2013-08-02: 40 mg via SUBCUTANEOUS
  Filled 2013-08-02: qty 0.4

## 2013-08-02 MED ORDER — FAMOTIDINE 20 MG PO TABS
20.0000 mg | ORAL_TABLET | Freq: Two times a day (BID) | ORAL | Status: DC
  Administered 2013-08-02 – 2013-08-04 (×3): 20 mg via ORAL
  Filled 2013-08-02 (×6): qty 1

## 2013-08-02 MED ORDER — ONDANSETRON HCL 4 MG/2ML IJ SOLN
INTRAMUSCULAR | Status: AC
  Administered 2013-08-02: 4 mg
  Filled 2013-08-02: qty 2

## 2013-08-02 MED ORDER — NITROGLYCERIN 0.4 MG SL SUBL
0.4000 mg | SUBLINGUAL_TABLET | SUBLINGUAL | Status: DC | PRN
  Administered 2013-08-02: 0.4 mg via SUBLINGUAL
  Filled 2013-08-02 (×2): qty 25

## 2013-08-02 MED ORDER — MORPHINE SULFATE 2 MG/ML IJ SOLN: 2.0000 mg | INTRAMUSCULAR | Status: DC | PRN

## 2013-08-02 MED ORDER — MORPHINE SULFATE 4 MG/ML IJ SOLN
4.0000 mg | Freq: Once | INTRAMUSCULAR | Status: AC
  Administered 2013-08-02: 4 mg via INTRAVENOUS
  Filled 2013-08-02: qty 1

## 2013-08-02 NOTE — ED Provider Notes (Signed)
CSN: [id number]     Arrival date & time 08/02/13  1103 History   First MD Initiated Contact with Patient 08/02/13 1108     Chief Complaint  Patient presents with  . Chest Pain  . Nausea  . Emesis   (Consider location/radiation/quality/duration/timing/severity/associated sxs/prior Treatment) Patient is a 77 y.o. male presenting with chest pain and vomiting.  Chest Pain Pain location:  Substernal area Pain quality: burning   Pain radiates to:  L shoulder Pain severity:  Moderate Onset quality:  Sudden Duration:  2 hours Timing:  Constant Progression:  Improving Chronicity:  New Context comment:  After awakening and drinking some water.   Relieved by:  Nitroglycerin Associated symptoms: diaphoresis, nausea and vomiting   Associated symptoms: no abdominal pain, no cough, no fever and no shortness of breath   Emesis Associated symptoms: no abdominal pain     Past Medical History  Diagnosis Date  . Atrial flutter 05/2009    STATUS POST CARDIOVERSION   . Bladder cancer   . Sleep apnea   . Depression    Past Surgical History  Procedure Laterality Date  . Carpal tunnel release    . Bladder surgery     Family History  Problem Relation Age of Onset  . Heart failure Father   . Stroke Father   . Kidney failure Mother   . Coronary artery disease     History  Substance Use Topics  . Smoking status: Former Smoker    Quit date: 08/11/1981  . Smokeless tobacco: Not on file  . Alcohol Use: No    Review of Systems  Constitutional: Positive for diaphoresis. Negative for fever.  HENT: Negative for congestion.   Respiratory: Negative for cough and shortness of breath.   Cardiovascular: Positive for chest pain.  Gastrointestinal: Positive for nausea and vomiting. Negative for abdominal pain.  All other systems reviewed and are negative.    Allergies  Bupropion; Adhesive; Flomax; and Pantoprazole sodium  Home Medications   Current Outpatient Rx  Name  Route  Sig   Dispense  Refill  . dexamethasone (DECADRON) 4 MG tablet   Oral   Take 4 mg by mouth 2 (two) times daily with a meal. On days 2, 3, 9, and 10 of cycle following chemo  ( day 1 of cycle was 12.4.14)         . levofloxacin (LEVAQUIN) 750 MG tablet   Oral   Take 1 tablet (750 mg total) by mouth daily. For 5 days   5 tablet   0   . lidocaine-prilocaine (EMLA) cream   Topical   Apply 1 application topically as needed (for port).         . ondansetron (ZOFRAN) 8 MG tablet   Oral   Take 8 mg by mouth every 8 (eight) hours as needed for nausea or vomiting.         . sertraline (ZOLOFT) 100 MG tablet   Oral   Take 150 mg by mouth every morning.          . venlafaxine XR (EFFEXOR-XR) 150 MG 24 hr capsule   Oral   Take 150 mg by mouth daily with breakfast.           SpO2 98% Physical Exam  Nursing note and vitals reviewed. Constitutional: He is oriented to person, place, and time. He appears well-developed and well-nourished. No distress.  HENT:  Head: Normocephalic and atraumatic.  Mouth/Throat: Oropharynx is clear and moist.  Eyes: Conjunctivae are  normal. Pupils are equal, round, and reactive to light. No scleral icterus.  Neck: Neck supple.  Cardiovascular: Normal rate, regular rhythm, normal heart sounds and intact distal pulses.   No murmur heard. Pulmonary/Chest: Effort normal and breath sounds normal. No stridor. No respiratory distress. He has no wheezes. He has no rales.  Abdominal: Soft. He exhibits no distension. There is no tenderness. There is no rebound and no guarding.  Musculoskeletal: Normal range of motion. He exhibits no edema.  Neurological: He is alert and oriented to person, place, and time.  Skin: Skin is warm and dry. No rash noted.  Psychiatric: He has a normal mood and affect. His behavior is normal.    ED Course  Procedures (including critical care time) Labs Review Labs Reviewed  CBC WITH DIFFERENTIAL - Abnormal; Notable for the  following:    WBC 2.5 (*)    RBC 3.81 (*)    Hemoglobin 11.8 (*)    HCT 34.6 (*)    Neutro Abs 1.5 (*)    Monocytes Relative 2 (*)    Monocytes Absolute 0.0 (*)    All other components within normal limits  COMPREHENSIVE METABOLIC PANEL - Abnormal; Notable for the following:    Glucose, Bld 116 (*)    BUN 26 (*)    Calcium 8.2 (*)    Albumin 3.4 (*)    GFR calc non Af Amer 52 (*)    GFR calc Af Amer 60 (*)    All other components within normal limits  D-DIMER, QUANTITATIVE - Abnormal; Notable for the following:    D-Dimer, Quant 1.09 (*)    All other components within normal limits  TROPONIN I  PRO B NATRIURETIC PEPTIDE   Imaging Review Ct Angio Chest Pe W/cm &/or Wo Cm  08/02/2013   CLINICAL DATA:  Chest pain  EXAM: CT ANGIOGRAPHY CHEST WITH CONTRAST  TECHNIQUE: Multidetector CT imaging of the chest was performed using the standard protocol during bolus administration of intravenous contrast. Multiplanar CT image reconstructions including MIPs were obtained to evaluate the vascular anatomy.  CONTRAST:  OMNIPAQUE IOHEXOL 350 MG/ML SOLN  COMPARISON:  Chest x-ray from today  FINDINGS: Negative for pulmonary embolism. Negative for aortic dissection. Coronary artery calcification. Heart size within normal limits.  The lungs are clear. Negative for lung mass. No infiltrate or effusion.  No mediastinal or hilar adenopathy. Retrocrural lymph nodes on the right are prominent, measuring 22 x 13 mm, and 23 x 13 mm. Bilateral hepatic cysts are present. No other adenopathy in the upper abdomen.  Review of the MIP images confirms the above findings.  IMPRESSION: Negative for pulmonary embolism.  No acute abnormality in the chest.  Retrocrural adenopathy which could be due to metastatic disease or benign hypertrophy. No evidence of primary malignancy on this study.   Electronically Signed   By: Marlan Palau M.D.   On: 08/02/2013 15:17   Dg Chest Port 1 View  08/02/2013   CLINICAL DATA:  Chest  pain, history bladder cancer, cardiac ablation  EXAM: PORTABLE CHEST - 1 VIEW  COMPARISON:  Portable exam 1153 hr compared 07/17/2013  FINDINGS: Right jugular Port-A-Cath, tip projecting over SVC.  Upper normal heart size.  Tortuous aorta.  Mediastinal contours and pulmonary vascularity normal.  Lungs clear.  No pleural effusion or pneumothorax.  Bones unremarkable.  IMPRESSION: No acute abnormalities.   Electronically Signed   By: Ulyses Southward M.D.   On: 08/02/2013 12:20  All radiology studies independently viewed by me.  EKG Interpretation    Date/Time:  Tuesday August 02 2013 11:42:52 EST Ventricular Rate:  79 PR Interval:  238 QRS Duration: 90 QT Interval:  375 QTC Calculation: 430 R Axis:   -2 Text Interpretation:  Sinus rhythm Prolonged PR interval Low voltage, precordial leads No significant change was found Confirmed by [facility]  MD, TREY ([id number]) on 08/02/2013 12:13:37 PM          EKG - sinus rhythm, rate 76, normal axis, prolonged PR interval, no ST/T changes, similar to prior.   MDM   1. Chest pain    77 yo male with hx of Bladder cancer presenting with chest pain, onset this AM shortly after awakening.  Left sided, radiated to left shoulder, associated with diaphoresis, nausea, vomiting.  He had a CT scan performed recently at [facility] which found some coronary atherosclerosis.  Chest pain improved significantly with nitro, resolved with morphine.  Initial EKG and troponin reassuring.  DDimer elevated, but CTA PE study negative.  Due to elevated risk and story concerning for ACS, have asked Dr. Isidoro Donning (Hospitalist) to admit for further testing and monitoring.      Candyce Churn, MD 08/02/13 [phone]

## 2013-08-02 NOTE — Consult Note (Signed)
ANTICOAGULATION CONSULT NOTE - Initial Consult  Pharmacy Consult for Heparin Indication: chest pain/ACS  Allergies  Allergen Reactions  . Bupropion Hives  . Adhesive [Tape] Itching and Rash  . Flomax [Tamsulosin Hcl] Rash    When taken with protonix only patient can take this alone  . Pantoprazole Sodium Rash    When taken with flomax only patient can take this alone  . Zoloft [Sertraline Hcl] Rash    Patient Measurements: Height: 5' 8.9" (175 cm) Weight: 250 lb (113.4 kg) IBW/kg (Calculated) : 70.47 Heparin Dosing Weight: 95.6kg  Vital Signs: Temp: 98.8 F (37.1 C) 08-18-2023 2116) Temp src: Oral 2023-08-18 2116) BP: 125/64 mmHg 2023-08-18 2116) Pulse Rate: 80 2023-08-18 2116)  Labs:  Recent Labs  Aug 17, 2013 1133 08-17-13 1856  HGB 11.8*  --   HCT 34.6*  --   PLT 230  --   CREATININE 1.31  --   CKTOTAL  --  227  CKMB  --  13.1*  TROPONINI <0.30 2.27*    Estimated Creatinine Clearance: 60.4 ml/min (by C-G formula based on Cr of 1.31).   Medical History: Past Medical History  Diagnosis Date  . Atrial flutter 05/2009    STATUS POST CARDIOVERSION   . Depression   . History of immunotherapy 2014    "had many times before and it worked; had a severe reaction February 02, 2013; they coded me, I had sepsis; almost died" (Aug 17, 2013)  . Bladder cancer   . Urothelial carcinoma     'started chemo 07/2013 " (2013-08-17)  . OSA on CPAP   . Pneumonia     "several times" (17-Aug-2013)    Medications:  NO anticoagulants  Assessment: 77yom with hx aflutter s/p ablation admitted for chest pain, initially thought to be atypical. Troponin is now positive and he will begin IV heparin. He has some renal insufficiency with sCr 1.3 and CrCl 45ml/min. CBC is wnl. INR pending, but don't expect it to be elevated since not on anticoagulants and LFTs wnl.  Received a dose of lovenox 40mg  at 1839 so will avoid bolus.  Goal of Therapy:  Heparin level 0.3-0.7 units/ml Monitor platelets by anticoagulation  protocol: Yes   Plan:  1) No bolus 2) Begin heparin drip at 1350 units/hr 3) 8 hour heparin level 4) Daily heparin level and CBC  Fredrik Rigger 08/17/13,10:49 PM

## 2013-08-02 NOTE — H&P (Signed)
History and Physical       Hospital Admission Note Date: 08/02/2013  Patient name: James [name] Medical record number: [id number] Date of birth: 12/24/1935 Age: 77 y.o. Gender: male  PCP: Londell Moh, MD    Chief Complaint:  Chest pain  HPI:  Patient is a 77 year old male with history of atrial fibrillation status post ablation, kidney/bladder cancer, currently on chemotherapy at [facility] presented to the ER for chest pain episode today. History was obtained from the patient who reported that he was sitting in the kitchen when he had substernal chest pain radiating towards the left side and towards the back, no palpitations or diaphoresis. Patient did report having some nausea, no numbness or tingling in the arm. The patient was sent he does have some GERD but this was different.  EKG did not show any acute changes of ischemia, first set of troponins negative.  Review of Systems:  Constitutional: Denies fever, chills, diaphoresis, poor appetite and fatigue.  HEENT: Denies photophobia, eye pain, redness, hearing loss, ear pain, congestion, sore throat, rhinorrhea, sneezing, mouth sores, trouble swallowing, neck pain, neck stiffness and tinnitus.   Respiratory: Denies SOB, DOE, cough, chest tightness,  and wheezing.   Cardiovascular: please see history of present illness  Gastrointestinal: Denies nausea, vomiting, abdominal pain, diarrhea, constipation, blood in stool and abdominal distention.  Genitourinary: Denies dysuria, urgency, frequency, hematuria, flank pain and difficulty urinating.  Musculoskeletal: Denies myalgias, back pain, joint swelling, arthralgias and gait problem.  Skin: Denies pallor, rash and wound.  Neurological: Denies dizziness, seizures, syncope, weakness, light-headedness, numbness and headaches.  Hematological: Denies adenopathy. Easy bruising, personal or family bleeding history   Psychiatric/Behavioral: Denies suicidal ideation, mood changes, confusion, nervousness, sleep disturbance and agitation  Past Medical History: Past Medical History  Diagnosis Date  . Atrial flutter 05/2009    STATUS POST CARDIOVERSION   . Bladder cancer   . Sleep apnea   . Depression    Past Surgical History  Procedure Laterality Date  . Carpal tunnel release    . Bladder surgery      Medications: Prior to Admission medications   Medication Sig Start Date End Date Taking? Authorizing Provider  dexamethasone (DECADRON) 4 MG tablet Take 4 mg by mouth 2 (two) times daily with a meal. On days 2, 3, 9, and 10 of cycle following chemo  ( day 1 of cycle was 12.4.14)   Yes Historical Provider, MD  lidocaine-prilocaine (EMLA) cream Apply 1 application topically as needed (for port).   Yes Historical Provider, MD  ondansetron (ZOFRAN) 8 MG tablet Take 8 mg by mouth every 8 (eight) hours as needed for nausea or vomiting.   Yes Historical Provider, MD  sertraline (ZOLOFT) 100 MG tablet Take 150 mg by mouth every morning.    Yes Historical Provider, MD  venlafaxine XR (EFFEXOR-XR) 150 MG 24 hr capsule Take 150 mg by mouth daily with breakfast.    Yes Historical Provider, MD    Allergies:   Allergies  Allergen Reactions  . Bupropion Hives  . Adhesive [Tape] Itching and Rash  . Flomax [Tamsulosin Hcl] Rash    When taken with protonix only patient can take this alone  . Pantoprazole Sodium Rash    When taken with flomax only patient can take this alone  . Zoloft [Sertraline Hcl] Rash    Social History:  reports that he quit smoking about 31 years ago. He does not have any smokeless tobacco history on file. He reports that he does  not drink alcohol or use illicit drugs.  Family History: Family History  Problem Relation Age of Onset  . Heart failure Father   . Stroke Father   . Kidney failure Mother   . Coronary artery disease      Physical Exam: Blood pressure 119/73, pulse 80,  temperature 98.1 F (36.7 C), temperature source Oral, resp. rate 17, SpO2 98.00%. General: Alert, awake, oriented x3, in no acute distress. HEENT: normocephalic, atraumatic, anicteric sclera, pink conjunctiva, pupils equal and reactive to light and accomodation, oropharynx clear Neck: supple, no masses or lymphadenopathy, no goiter, no bruits  Heart: Regular rate and rhythm, without murmurs, rubs or gallops. Lungs: Clear to auscultation bilaterally, no wheezing, rales or rhonchi. Abdomen: Soft, nontender, nondistended, positive bowel sounds, no masses. Extremities: No clubbing, cyanosis or edema with positive pedal pulses. Neuro: Grossly intact, no focal neurological deficits, strength 5/5 upper and lower extremities bilaterally Psych: alert and oriented x 3, normal mood and affect Skin: no rashes or lesions, warm and dry   LABS on Admission:  Basic Metabolic Panel:  Recent Labs Lab 08/02/13 1133  NA 135  K 4.3  CL 99  CO2 26  GLUCOSE 116*  BUN 26*  CREATININE 1.31  CALCIUM 8.2*   Liver Function Tests:  Recent Labs Lab 08/02/13 1133  AST 23  ALT 28  ALKPHOS 63  BILITOT 0.4  PROT 6.8  ALBUMIN 3.4*   No results found for this basename: LIPASE, AMYLASE,  in the last 168 hours No results found for this basename: AMMONIA,  in the last 168 hours CBC:  Recent Labs Lab 08/02/13 1133  WBC 2.5*  NEUTROABS 1.5*  HGB 11.8*  HCT 34.6*  MCV 90.8  PLT 230   Cardiac Enzymes:  Recent Labs Lab 08/02/13 1133  TROPONINI <0.30   BNP: No components found with this basename: POCBNP,  CBG: No results found for this basename: GLUCAP,  in the last 168 hours   Radiological Exams on Admission: Dg Chest 2 View  07/17/2013   CLINICAL DATA:  History of bladder malignancy on chemotherapy now with 1 day history of fever, previous history of tobacco use.  EXAM: CHEST  2 VIEW  COMPARISON:  Chest x-ray dated dated July 16, 2013, and March 31, 2009  FINDINGS: The lungs are  mildly hyperinflated. There is hazy density in the region of the lingula on the frontal film which is not clearly evident on the lateral film. There is a trace of blunting of the posterior costophrenic angle on the left. There is minimally increased density in the lung parenchyma on the right in the infrahilar region above the hemidiaphragm without obscure a shin. This is slightly less conspicuous than on today's earlier study. On the lateral film no definite right middle lobe or lower lobe infiltrate is suspected. The cardiopericardial silhouette is normal in size. The pulmonary vascularity is not engorged. An 64 Port-A-Cath type appliance is in place with the tip in the region of the distal SVC. There is tortuosity of the descending thoracic aorta which is stable. There are mild degenerative disc changes of the thoracic spine.  IMPRESSION: There is subtle density obscuring the dome of the left hemidiaphragm the suggests lingular atelectasis. There may be a trace of pleural fluid on the left is well. Minimal increased infrahilar lung markings on the right are present but are of questionable significance.   Electronically Signed   By: David  [place]   On: 07/17/2013 09:17   Dg Chest 2 View  07/16/2013   CLINICAL DATA:  Status post first round of chemotherapy for bladder cancer; fatigue and decreased appetite. Fever.  EXAM: CHEST  2 VIEW  COMPARISON:  Chest radiograph performed 03/31/2009  FINDINGS: The lungs are well-aerated. Mild vascular congestion is noted. Mild left basilar opacity may reflect atelectasis or less likely mild pneumonia, depending on the patient's symptoms. There is no evidence of pleural effusion or pneumothorax. A right-sided chest port is noted ending about the cavoatrial junction.  The heart is normal in size; the mediastinal contour is within normal limits. No acute osseous abnormalities are seen.  IMPRESSION: 1. Mild left basilar opacity may reflect atelectasis or less likely mild  pneumonia, depending on the patient's symptoms. 2. Mild vascular congestion noted.   Electronically Signed   By: Roanna Raider M.D.   On: 07/16/2013 22:54   Ct Angio Chest Pe W/cm &/or Wo Cm  08/02/2013   CLINICAL DATA:  Chest pain  EXAM: CT ANGIOGRAPHY CHEST WITH CONTRAST  TECHNIQUE: Multidetector CT imaging of the chest was performed using the standard protocol during bolus administration of intravenous contrast. Multiplanar CT image reconstructions including MIPs were obtained to evaluate the vascular anatomy.  CONTRAST:  OMNIPAQUE IOHEXOL 350 MG/ML SOLN  COMPARISON:  Chest x-ray from today  FINDINGS: Negative for pulmonary embolism. Negative for aortic dissection. Coronary artery calcification. Heart size within normal limits.  The lungs are clear. Negative for lung mass. No infiltrate or effusion.  No mediastinal or hilar adenopathy. Retrocrural lymph nodes on the right are prominent, measuring 22 x 13 mm, and 23 x 13 mm. Bilateral hepatic cysts are present. No other adenopathy in the upper abdomen.  Review of the MIP images confirms the above findings.  IMPRESSION: Negative for pulmonary embolism.  No acute abnormality in the chest.  Retrocrural adenopathy which could be due to metastatic disease or benign hypertrophy. No evidence of primary malignancy on this study.   Electronically Signed   By: Marlan Palau M.D.   On: 08/02/2013 15:17   Dg Chest Port 1 View  08/02/2013   CLINICAL DATA:  Chest pain, history bladder cancer, cardiac ablation  EXAM: PORTABLE CHEST - 1 VIEW  COMPARISON:  Portable exam 1153 hr compared 07/17/2013  FINDINGS: Right jugular Port-A-Cath, tip projecting over SVC.  Upper normal heart size.  Tortuous aorta.  Mediastinal contours and pulmonary vascularity normal.  Lungs clear.  No pleural effusion or pneumothorax.  Bones unremarkable.  IMPRESSION: No acute abnormalities.   Electronically Signed   By: Ulyses Southward M.D.   On: 08/02/2013 12:20    Assessment/Plan Principal  Problem:   Chest pain: Atypical but no prior cardiac workup - Rule out ACS, EKG did not show acute ST-T wave changes suggestive of ischemia, troponins negative, echo in 2012 had shown EF of 55-60%, normal wall motion. -D-dimer was elevated hence CT angiogram of the chest was done which showed no PE or aortic dissection , Did have coronary artery calcification  - For now place on aspirin, BP is somewhat borderline at this time, obtain lipid panel  - Cardiology consult obtained for possible inpatient versus outpatient stress test or further cardiac workup   Active Problems:   Atrial flutter: Status post ablation, currently patient is in sinus rhythm continue aspirin    Bladder cancer - Patient is receiving chemotherapy [facility]  DVT prophylaxis: Lovenox  CODE STATUS: Full CODE STATUS  Family Communication: Admission, patients condition and plan of care including tests being ordered have been discussed  with the patient anddaughter who indicates understanding and agree with the plan and Code Status   Further plan will depend as patient's clinical course evolves and further radiologic and laboratory data become available.   Time Spent on Admission: 1 hour  Meloni Hinz M.D. [facility] 08/02/2013, 4:33 PM Pager: [phone]  If 7PM-7AM, please contact night-coverage [email or web address] Password [email or web address]

## 2013-08-02 NOTE — Progress Notes (Signed)
Unit CM UR Completed by [facility] ED CM  W. Han Vejar RN  

## 2013-08-02 NOTE — Progress Notes (Signed)
Paged by RN, Re: Troponin elevated at 2.3 with CKMB elevation at 13.1. No clinical status changes.  NSTEMI suggested by new diagnostic data  -- Check INR -- Heparin gtt ACS protocol per pharmacy consult -- Continue cardiac enzymes for 2 more sets -- If Troponin further increases, consider LHC tomorrow or post medical treatment   Haydee Salter, MD Cardiology Consult

## 2013-08-02 NOTE — Consult Note (Signed)
CARDIOLOGY CONSULT NOTE   Patient ID: James [name] MRN: [id number], DOB/AGE: 02-24-1936   Admit date: 08/02/2013 Date of Consult: 08/02/2013   Primary Physician: Londell Moh, MD Primary Cardiologist: [name]  Pt. Profile  77 year old gentleman being admitted for chest pain.  Problem List  Past Medical History  Diagnosis Date  . Atrial flutter 05/2009    STATUS POST CARDIOVERSION   . Bladder cancer   . Sleep apnea   . Depression     Past Surgical History  Procedure Laterality Date  . Carpal tunnel release    . Bladder surgery       Allergies  Allergies  Allergen Reactions  . Bupropion Hives  . Adhesive [Tape] Itching and Rash  . Flomax [Tamsulosin Hcl] Rash    When taken with protonix only patient can take this alone  . Pantoprazole Sodium Rash    When taken with flomax only patient can take this alone  . Zoloft [Sertraline Hcl] Rash    HPI   This 77 year old gentleman is admitted because of chest pain.  He has no prior known heart artery disease.  He has a history in the past of atrial flutter.  He underwent cardioversion by [name] in October of 2010.  He subsequently underwent ablation of his atrial flutter in June of 2012 by Dr. Graciela Husbands.  He was last seen in the office in August of 2012 at which time he was already off his anticoagulants.  He has had no recurrence of his atrial flutter. Today the patient was sitting at his desk and developed precordial chest discomfort which radiated toward the left side there was no arm radiation.  The patient initially thought that this might be a form of indigestion but then was concerned that it could be his heart.  He took several baby aspirin.  He called 911 and was brought to the emergency room.  Initial workup includes elevated d-dimer however CT angiogram is negative for pulmonary embolus or dissection.  His initial cardiac enzymes are normal and his electrocardiogram shows no ischemic change.  He is pain  free at this time.  The patient goes to the gym periodically to work out.  He does not have any history of exertional chest discomfort.  He is in the midst of chemotherapy for bladder cancer.  Inpatient Medications     Family History Family History  Problem Relation Age of Onset  . Heart failure Father   . Stroke Father   . Kidney failure Mother   . Coronary artery disease       Social History History   Social History  . Marital Status: Married    Spouse Name: N/A    Number of Children: N/A  . Years of Education: N/A   Occupational History  . Not on file.   Social History Main Topics  . Smoking status: Former Smoker    Quit date: 08/11/1981  . Smokeless tobacco: Not on file  . Alcohol Use: No  . Drug Use: No  . Sexual Activity:    Other Topics Concern  . Not on file   Social History Narrative    The patient is a retired Radiographer, therapeutic.  He used to     smoke but quit in 1983.  He does not drink alcohol.      Review of Systems  General:  No chills, fever, night sweats or weight changes.  Cardiovascular:  No , dyspnea on exertion, edema, orthopnea, palpitations, paroxysmal nocturnal dyspnea.  Dermatological: No rash, lesions/masses Respiratory: No cough, dyspnea Urologic: No hematuria, dysuria Abdominal:   No nausea, vomiting, diarrhea, bright red blood per rectum, melena, or hematemesis Neurologic:  No visual changes, wkns, changes in mental status. All other systems reviewed and are otherwise negative except as noted above.  Physical Exam  Blood pressure 119/73, pulse 80, temperature 98.1 F (36.7 C), temperature source Oral, resp. rate 17, SpO2 98.00%.  General: Pleasant, NAD Psych: Normal affect. Neuro: Alert and oriented X 3. Moves all extremities spontaneously. HEENT: Normal  Neck: Supple without bruits or JVD. Lungs:  Resp regular and unlabored, CTA. Heart: RRR no s3, s4, or murmurs.  There is a port for chemotherapy in the right upper  chest. Abdomen: Soft, non-tender, non-distended, BS + x 4.  Extremities: No clubbing, cyanosis or edema. DP/PT/Radials 2+ and equal bilaterally.  Labs   Recent Labs  08/02/13 1133  TROPONINI <0.30   Lab Results  Component Value Date   WBC 2.5* 08/02/2013   HGB 11.8* 08/02/2013   HCT 34.6* 08/02/2013   MCV 90.8 08/02/2013   PLT 230 08/02/2013     Recent Labs Lab 08/02/13 1133  NA 135  K 4.3  CL 99  CO2 26  BUN 26*  CREATININE 1.31  CALCIUM 8.2*  PROT 6.8  BILITOT 0.4  ALKPHOS 63  ALT 28  AST 23  GLUCOSE 116*   No results found for this basename: CHOL,  HDL,  LDLCALC,  TRIG   Lab Results  Component Value Date   DDIMER 1.09* 08/02/2013    Radiology/Studies  Dg Chest 2 View  07/17/2013   CLINICAL DATA:  History of bladder malignancy on chemotherapy now with 1 day history of fever, previous history of tobacco use.  EXAM: CHEST  2 VIEW  COMPARISON:  Chest x-ray dated dated July 16, 2013, and March 31, 2009  FINDINGS: The lungs are mildly hyperinflated. There is hazy density in the region of the lingula on the frontal film which is not clearly evident on the lateral film. There is a trace of blunting of the posterior costophrenic angle on the left. There is minimally increased density in the lung parenchyma on the right in the infrahilar region above the hemidiaphragm without obscure a shin. This is slightly less conspicuous than on today's earlier study. On the lateral film no definite right middle lobe or lower lobe infiltrate is suspected. The cardiopericardial silhouette is normal in size. The pulmonary vascularity is not engorged. An 40 Port-A-Cath type appliance is in place with the tip in the region of the distal SVC. There is tortuosity of the descending thoracic aorta which is stable. There are mild degenerative disc changes of the thoracic spine.  IMPRESSION: There is subtle density obscuring the dome of the left hemidiaphragm the suggests lingular atelectasis.  There may be a trace of pleural fluid on the left is well. Minimal increased infrahilar lung markings on the right are present but are of questionable significance.   Electronically Signed   By: David  [place]   On: 07/17/2013 09:17   Dg Chest 2 View  07/16/2013   CLINICAL DATA:  Status post first round of chemotherapy for bladder cancer; fatigue and decreased appetite. Fever.  EXAM: CHEST  2 VIEW  COMPARISON:  Chest radiograph performed 03/31/2009  FINDINGS: The lungs are well-aerated. Mild vascular congestion is noted. Mild left basilar opacity may reflect atelectasis or less likely mild pneumonia, depending on the patient's symptoms. There is no evidence of pleural effusion or  pneumothorax. A right-sided chest port is noted ending about the cavoatrial junction.  The heart is normal in size; the mediastinal contour is within normal limits. No acute osseous abnormalities are seen.  IMPRESSION: 1. Mild left basilar opacity may reflect atelectasis or less likely mild pneumonia, depending on the patient's symptoms. 2. Mild vascular congestion noted.   Electronically Signed   By: Roanna Raider M.D.   On: 07/16/2013 22:54   Ct Angio Chest Pe W/cm &/or Wo Cm  08/02/2013   CLINICAL DATA:  Chest pain  EXAM: CT ANGIOGRAPHY CHEST WITH CONTRAST  TECHNIQUE: Multidetector CT imaging of the chest was performed using the standard protocol during bolus administration of intravenous contrast. Multiplanar CT image reconstructions including MIPs were obtained to evaluate the vascular anatomy.  CONTRAST:  OMNIPAQUE IOHEXOL 350 MG/ML SOLN  COMPARISON:  Chest x-ray from today  FINDINGS: Negative for pulmonary embolism. Negative for aortic dissection. Coronary artery calcification. Heart size within normal limits.  The lungs are clear. Negative for lung mass. No infiltrate or effusion.  No mediastinal or hilar adenopathy. Retrocrural lymph nodes on the right are prominent, measuring 22 x 13 mm, and 23 x 13 mm. Bilateral  hepatic cysts are present. No other adenopathy in the upper abdomen.  Review of the MIP images confirms the above findings.  IMPRESSION: Negative for pulmonary embolism.  No acute abnormality in the chest.  Retrocrural adenopathy which could be due to metastatic disease or benign hypertrophy. No evidence of primary malignancy on this study.   Electronically Signed   By: Marlan Palau M.D.   On: 08/02/2013 15:17   Dg Chest Port 1 View  08/02/2013   CLINICAL DATA:  Chest pain, history bladder cancer, cardiac ablation  EXAM: PORTABLE CHEST - 1 VIEW  COMPARISON:  Portable exam 1153 hr compared 07/17/2013  FINDINGS: Right jugular Port-A-Cath, tip projecting over SVC.  Upper normal heart size.  Tortuous aorta.  Mediastinal contours and pulmonary vascularity normal.  Lungs clear.  No pleural effusion or pneumothorax.  Bones unremarkable.  IMPRESSION: No acute abnormalities.   Electronically Signed   By: Ulyses Southward M.D.   On: 08/02/2013 12:20    ECG  Sinus rhythm Prolonged PR interval Low voltage, precordial leads No significant change was found  ASSESSMENT AND PLAN  Atypical chest pain. Past history of atrial flutter.  No recurrence since flutter ablation in 2012. Bladder cancer  Plan: The patient is being admitted for observation overnight.  Serial enzymes will be obtained.  We will repeat his EKG in the morning.  If all is negative, he should be able to be discharged and then return to Dr. Harvie Bridge office for an outpatient Myoview stress test Signed, Cassell Clement,  MD  08/02/2013, 5:02 PM

## 2013-08-02 NOTE — ED Notes (Addendum)
Pt reports to the ED via [facility] for eval of sudden onset midsternal CP that radiates into his back. Pt describes the pain as constant. Pt was sitting in a chair when the pain began. Pts pain was 5/10 on EMS arrival. Pt now reporting pain of 3/10. Pt had 4 nitros and 4 baby ASA. Pt reports associated symptoms of N/V, SOB, generalized weakness, and diaphoresis. Pt has hx of bladder cancer and his last chemo tx was last Thursday. 12 lead revealed 1st degree block. Pt has hx of ablation. Pt A&O x4 and resp e/u. Skin cool and damp.

## 2013-08-03 ENCOUNTER — Encounter (HOSPITAL_COMMUNITY)
Admission: EM | Disposition: A | Discharge status: Home / Self Care | Payer: Medicare Other | Source: Home / Self Care | Attending: Internal Medicine

## 2013-08-03 DIAGNOSIS — I214 Non-ST elevation (NSTEMI) myocardial infarction: Secondary | ICD-10-CM

## 2013-08-03 DIAGNOSIS — I2510 Atherosclerotic heart disease of native coronary artery without angina pectoris: Secondary | ICD-10-CM

## 2013-08-03 HISTORY — PX: PERCUTANEOUS CORONARY STENT INTERVENTION (PCI-S): SHX5485

## 2013-08-03 HISTORY — PX: LEFT HEART CATHETERIZATION WITH CORONARY ANGIOGRAM: SHX5451

## 2013-08-03 LAB — CBC
HCT: 34.3 % — ABNORMAL LOW (ref 39.0–52.0)
Hemoglobin: 11.3 g/dL — ABNORMAL LOW (ref 13.0–17.0)
RBC: 3.75 MIL/uL — ABNORMAL LOW (ref 4.22–5.81)
RDW: 14.3 % (ref 11.5–15.5)
WBC: 2.7 10*3/uL — ABNORMAL LOW (ref 4.0–10.5)

## 2013-08-03 LAB — COMPREHENSIVE METABOLIC PANEL
AST: 34 U/L (ref 0–37)
Albumin: 3.2 g/dL — ABNORMAL LOW (ref 3.5–5.2)
Alkaline Phosphatase: 64 U/L (ref 39–117)
BUN: 23 mg/dL (ref 6–23)
Chloride: 101 mEq/L (ref 96–112)
Creatinine, Ser: 1.35 mg/dL (ref 0.50–1.35)
GFR calc Af Amer: 58 mL/min — ABNORMAL LOW (ref >90)
GFR calc non Af Amer: 50 mL/min — ABNORMAL LOW (ref >90)
Glucose, Bld: 146 mg/dL — ABNORMAL HIGH (ref 70–99)
Total Bilirubin: 0.3 mg/dL (ref 0.3–1.2)
Total Protein: 6.6 g/dL (ref 6.0–8.3)

## 2013-08-03 LAB — CK TOTAL AND CKMB (NOT AT ARMC): Total CK: 281 U/L — ABNORMAL HIGH (ref 7–232)

## 2013-08-03 LAB — POCT ACTIVATED CLOTTING TIME: Activated Clotting Time: 415 seconds

## 2013-08-03 LAB — TROPONIN I
Troponin I: 5.18 ng/mL (ref <0.30)
Troponin I: 5.37 ng/mL (ref <0.30)

## 2013-08-03 LAB — HEPARIN LEVEL (UNFRACTIONATED): Heparin Unfractionated: 0.33 IU/mL (ref 0.30–0.70)

## 2013-08-03 SURGERY — LEFT HEART CATHETERIZATION WITH CORONARY ANGIOGRAM
Anesthesia: LOCAL

## 2013-08-03 MED ORDER — MIDAZOLAM HCL 2 MG/2ML IJ SOLN
INTRAMUSCULAR | Status: AC
  Filled 2013-08-03: qty 2

## 2013-08-03 MED ORDER — OXYCODONE-ACETAMINOPHEN 5-325 MG PO TABS: 1.0000 | ORAL_TABLET | ORAL | Status: DC | PRN

## 2013-08-03 MED ORDER — SODIUM CHLORIDE 0.9 % IV SOLN: 250.0000 mL | INTRAVENOUS | Status: DC | PRN

## 2013-08-03 MED ORDER — HEPARIN (PORCINE) IN NACL 100-0.45 UNIT/ML-% IJ SOLN
1400.0000 [IU]/h | INTRAMUSCULAR | Status: DC
Start: 2013-08-03 — End: 2013-08-03
  Filled 2013-08-03: qty 250

## 2013-08-03 MED ORDER — SODIUM CHLORIDE 0.9 % IJ SOLN: 3.0000 mL | INTRAMUSCULAR | Status: DC | PRN

## 2013-08-03 MED ORDER — LIDOCAINE HCL (PF) 1 % IJ SOLN
INTRAMUSCULAR | Status: AC
  Filled 2013-08-03: qty 30

## 2013-08-03 MED ORDER — FENTANYL CITRATE 0.05 MG/ML IJ SOLN
INTRAMUSCULAR | Status: AC
  Filled 2013-08-03: qty 2

## 2013-08-03 MED ORDER — VERAPAMIL HCL 2.5 MG/ML IV SOLN
INTRAVENOUS | Status: AC
  Filled 2013-08-03: qty 2

## 2013-08-03 MED ORDER — SODIUM CHLORIDE 0.9 % IJ SOLN
3.0000 mL | Freq: Two times a day (BID) | INTRAMUSCULAR | Status: DC
  Administered 2013-08-03: 3 mL via INTRAVENOUS

## 2013-08-03 MED ORDER — CLOPIDOGREL BISULFATE 75 MG PO TABS
75.0000 mg | ORAL_TABLET | Freq: Every day | ORAL | Status: DC
  Administered 2013-08-04: 75 mg via ORAL
  Filled 2013-08-03: qty 1

## 2013-08-03 MED ORDER — HEPARIN (PORCINE) IN NACL 2-0.9 UNIT/ML-% IJ SOLN
INTRAMUSCULAR | Status: AC
  Filled 2013-08-03: qty 1500

## 2013-08-03 MED ORDER — HYDROMORPHONE HCL PF 2 MG/ML IJ SOLN
INTRAMUSCULAR | Status: AC
  Filled 2013-08-03: qty 1

## 2013-08-03 MED ORDER — CLOPIDOGREL BISULFATE 300 MG PO TABS
ORAL_TABLET | ORAL | Status: AC
  Filled 2013-08-03: qty 2

## 2013-08-03 MED ORDER — BIVALIRUDIN 250 MG IV SOLR
INTRAVENOUS | Status: AC
  Filled 2013-08-03: qty 250

## 2013-08-03 MED ORDER — SODIUM CHLORIDE 0.9 % IV SOLN: 1.0000 mL/kg/h | INTRAVENOUS | Status: DC

## 2013-08-03 MED ORDER — ASPIRIN 81 MG PO CHEW
81.0000 mg | CHEWABLE_TABLET | ORAL | Status: AC
  Administered 2013-08-03: 81 mg via ORAL

## 2013-08-03 MED ORDER — SODIUM CHLORIDE 0.9 % IV SOLN
0.2500 mg/kg/h | INTRAVENOUS | Status: DC
  Filled 2013-08-03: qty 250

## 2013-08-03 NOTE — Progress Notes (Signed)
    PROGRESS NOTE  Subjective:   Pt was admitted with CP, now has + Troponin levels c/w NSTEMI  Objective:    Vital Signs:   Temp:  [97.9 F (36.6 C)-98.8 F (37.1 C)] 97.9 F (36.6 C) (12/24 0436) Pulse Rate:  [66-80] 66 (12/24 0436) Resp:  [10-18] 18 (12/24 0436) BP: (108-134)/(57-93) 108/57 mmHg (12/24 0436) SpO2:  [95 %-100 %] 99 % (12/24 0436) Weight:  [250 lb (113.4 kg)-250 lb 1.6 oz (113.445 kg)] 250 lb 1.6 oz (113.445 kg) (12/24 0436)      24-hour weight change: Weight change:   Weight trends: Filed Weights   08/02/13 2116 08/03/13 0436  Weight: 250 lb (113.4 kg) 250 lb 1.6 oz (113.445 kg)    Intake/Output:  12/23 0701 - 12/24 0700 In: 240 [P.O.:240] Out: 1575 [Urine:1575]     Physical Exam: BP 108/57  Pulse 66  Temp(Src) 97.9 F (36.6 C) (Oral)  Resp 18  Ht 5' 8.9" (1.75 m)  Wt 250 lb 1.6 oz (113.445 kg)  BMI 37.04 kg/m2  SpO2 99%  General: Vital signs reviewed and noted.   Head: Normocephalic, atraumatic.  Eyes: conjunctivae/corneas clear.  EOM's intact.   Throat: normal  Neck:  normal   Lungs:    clear  Heart:  rr, normal S1, S2  Abdomen:  Soft, non-tender, non-distended    Extremities:    Neurologic: A&O X3, CN II - XII are grossly intact.   Psych: Normal     Labs: BMET:  Recent Labs  08/02/13 1133 08/03/13 0433  NA 135 136  K 4.3 4.0  CL 99 101  CO2 26 24  GLUCOSE 116* 146*  BUN 26* 23  CREATININE 1.31 1.35  CALCIUM 8.2* 8.3*    Liver function tests:  Recent Labs  08/02/13 1133 08/03/13 0433  AST 23 34  ALT 28 25  ALKPHOS 63 64  BILITOT 0.4 0.3  PROT 6.8 6.6  ALBUMIN 3.4* 3.2*   No results found for this basename: LIPASE, AMYLASE,  in the last 72 hours  CBC:  Recent Labs  08/02/13 1133 08/03/13 0433  WBC 2.5* 2.7*  NEUTROABS 1.5*  --   HGB 11.8* 11.3*  HCT 34.6* 34.3*  MCV 90.8 91.5  PLT 230 217    Cardiac Enzymes:  Recent Labs  08/02/13 1133 08/02/13 1856 08/02/13 2255 08/03/13 0433    CKTOTAL  --  227 281* 249*  CKMB  --  13.1* 15.1* 13.2*  TROPONINI <0.30 2.27* 5.18* 5.37*    Coagulation Studies:  Recent Labs  08/02/13 2255  LABPROT 12.5  INR 0.95    Other: No components found with this basename: POCBNP,   Recent Labs  08/02/13 1300  DDIMER 1.09*   No results found for this basename: HGBA1C,  in the last 72 hours  Recent Labs  08/02/13 1856  CHOL 129  HDL 37*  LDLCALC 71  TRIG 103  CHOLHDL 3.5   No results found for this basename: TSH, T4TOTAL, FREET3, T3FREE, THYROIDAB,  in the last 72 hours No results found for this basename: VITAMINB12, FOLATE, FERRITIN, TIBC, IRON, RETICCTPCT,  in the last 72 hours   Other results:  EKG :  NSR with 1st degree AV block.  No St or T wave changes   Medications:    Infusions: . heparin 1,400 Units/hr (08/03/13 0830)    Scheduled Medications: . aspirin EC  325 mg Oral Daily  . famotidine  20 mg Oral BID  . nitroGLYCERIN    0.2 mg Transdermal Q24H  . sertraline  150 mg Oral BH-q7a  . venlafaxine XR  150 mg Oral Q breakfast    Assessment/ Plan:    1. NSTEMI:  Patient with CP and + troponins c/w NSTEMI.  Plan is for cath today.  Discussed risks, benefits, options.  He has metastatic bladder cancer but no hx of hematura.      Disposition:  Length of Stay: 1  Ishitha Roper J. Comfort Iversen, Jr., MD, FACC 08/03/2013, 9:49 AM Office [phone] Pager [phone]    

## 2013-08-03 NOTE — CV Procedure (Signed)
    CARDIAC CATH NOTE  Name: James [name] MRN: [id number] DOB: 02/10/1936  Procedure: PTCA and stenting of the mid-LAD, PTCA and stenting of the proximal RCA  Indication: NSTEMI. Pt underwent cardiac cath by Dr Elease Hashimoto after presenting with ACS and ruling in for NSTEMI. This demonstrated severe 2 vessel CAD with severe stenosis of the mid-LAD and proximal RCA. After review of his films, we elected to proceed with PCI. Bare metal stent platforms were chosen because of his ongoing chemotherapy. His CAD is focal and involves large diameter vessels amenable to bare metal stenting. He was loaded with plavix 600 mg on the cath table.  Procedural Details: The radial artery was accessed with a sheath already in place (5/6 Fr). Weight-based bivalirudin was given for anticoagulation. Once a therapeutic ACT was achieved, a 6 [place] XB-LAD guide catheter was inserted.  A cougar coronary guidewire was used to cross the lesion in the mid-LAD.  The lesion was predilated with a 3.0 mm balloon.  The lesion was then stented with a 4.0x12 mm Rebel bare metal stent.  The stent was postdilated with a 4.0 mm noncompliant balloon.  Following PCI, there was 0% residual stenosis and TIMI-3 flow. Attention was then turned to the RCA. A JR-4 guide was inserted. A cougar wire was used to cross the lesion. The lesion was primarily stented with a 4.0x15 mm Rebel BMS. The stent was post-dilated with a 4.5 mm [place] balloon. Final angiography confirmed an excellent result with TIMI-3 flow and 0% residual stenosis. The patient tolerated the procedure well. There were no immediate procedural complications. A TR band was used for radial hemostasis. The patient was transferred to the post catheterization recovery area for further monitoring.  Lesion Data: Lesion 1 Vessel: LAD/mid Percent stenosis (pre): 95 TIMI-flow (pre):  3 Stent:  4.0x12 mm bare metal Rebel Percent stenosis (post): 0 TIMI-flow (post): 3  Lesion 2 Vessel:  RCA/prox Percent stenosis (pre): 90 TIMI-flow (pre):  3 Stent:  4.0x15 mm Rebel BMS Percent stenosis (post): 0 TIMI-flow (post): 3  Conclusions: Successful 2 vessel PCI using bare metal stents as detailed above.   Recommendations: ASA/plavix at least 30 days and 12 months if tolerated  Tonny Bollman 08/03/2013, 1:25 PM

## 2013-08-03 NOTE — Progress Notes (Signed)
Cardiac Enzymes remain elevated  CKMB 15.1  adn Troponin 5.18.  Pt continues to deny CP or other discomfort at this time. Pt with IV heparin infusing and NTG patch in place . Will continue to monitor. Dierdre Highman

## 2013-08-03 NOTE — Consult Note (Signed)
ANTICOAGULATION CONSULT NOTE   Pharmacy Consult for Heparin Indication: chest pain/ACS  Allergies  Allergen Reactions  . Bupropion Hives  . Adhesive [Tape] Itching and Rash  . Flomax [Tamsulosin Hcl] Rash    When taken with protonix only patient can take this alone  . Pantoprazole Sodium Rash    When taken with flomax only patient can take this alone  . Zoloft [Sertraline Hcl] Rash    Patient Measurements: Height: 5' 8.9" (175 cm) Weight: 250 lb 1.6 oz (113.445 kg) IBW/kg (Calculated) : 70.47 Heparin Dosing Weight: 95.6kg  Vital Signs: Temp: 97.9 F (36.6 C) (12/24 0436) Temp src: Oral (12/24 0436) BP: 108/57 mmHg (12/24 0436) Pulse Rate: 66 (12/24 0436)  Labs:  Recent Labs  August 29, 2013 1133 Aug 29, 2013 1856 29-Aug-2013 2255 08/03/13 0433 08/03/13 0730  HGB 11.8*  --   --  11.3*  --   HCT 34.6*  --   --  34.3*  --   PLT 230  --   --  217  --   LABPROT  --   --  12.5  --   --   INR  --   --  0.95  --   --   HEPARINUNFRC  --   --   --   --  0.33  CREATININE 1.31  --   --  1.35  --   CKTOTAL  --  227 281* 249*  --   CKMB  --  13.1* 15.1* 13.2*  --   TROPONINI <0.30 2.27* 5.18* 5.37*  --     Estimated Creatinine Clearance: 58.6 ml/min (by C-G formula based on Cr of 1.35).   Medical History: Past Medical History  Diagnosis Date  . Atrial flutter 05/2009    STATUS POST CARDIOVERSION   . Depression   . History of immunotherapy 2014    "had many times before and it worked; had a severe reaction 14-Feb-2013; they coded me, I had sepsis; almost died" (August 29, 2013)  . Bladder cancer   . Urothelial carcinoma     'started chemo 07/2013 " (2013/08/29)  . OSA on CPAP   . Pneumonia     "several times" (08-29-13)  . Myocardial infarction     "maybe today" (Aug 29, 2013)  . Osteoarthritis     "hands; knees; joints" (08-29-13)  . Kidney carcinoma     "left only" (29-Aug-2013)    Medications:  NO anticoagulants  Assessment: 77yom with hx aflutter s/p ablation admitted for  chest pain, initially thought to be atypical. Troponin is now positive and has not peaked at this time and he started on IV heparin. He has some renal insufficiency with sCr 1.3 and CrCl 26ml/min. CBC is stable overnight. INR normal not on anticoagulants pta.   Goal of Therapy:  Heparin level 0.3-0.7 units/ml Monitor platelets by anticoagulation protocol: Yes   Plan:  1) Continue heparin drip at 1400 units/hr 2) Recheck HL at noon 3) Daily heparin level and CBC 4) Follow up plan for possible cath  Sheppard Coil PharmD., BCPS Clinical Pharmacist Pager [phone] 08/03/2013 8:14 AM

## 2013-08-03 NOTE — CV Procedure (Signed)
     Cardiac Cath Note  James [name] [id number] 07/07/1936  Procedure: left  Heart Cardiac Catheterization Note Indications:  NSTEMI  Procedure Details Consent: Obtained Time Out: Verified patient identification, verified procedure, site/side was marked, verified correct patient position, special equipment/implants available, Radiology Safety Procedures followed,  medications/allergies/relevent history reviewed, required imaging and test results available.  Performed   Medications: Fentanyl: 50 mcg iv Versed: 2 mg iv Heparin 5500 units Verapamil 3 mg IA Plavix 600 mg   The right radial  artery was easily canulated using a modified Seldinger technique.  Hemodynamics:    LV pressure: 124/12/23 Aortic pressure: 126/71  Angiography   Left Main: large and ectatic,    Left anterior Descending: mid 95% stenosis, associated with thrombus.  The distal LAD has a 50% stenosis.  Diffuse moderate irregularities throughout.   Left Circumflex: mod  Diffuse Disease , large 1st OM that is occluded at the distal aspect.  2nd OM is  large .  Mild irregularities.     Right Coronary Artery:  Large and dominant,  Proximal 80% stenosis.  , mid 50% stenosis. Moderate disease in PDA and PLSA  LV Gram: normal LV function  Complications: No apparent complications Patient did tolerate procedure well.  Contrast used: 80 cc  Conclusions:   1. 2 vessel CAD involving the mid LAD and proximal RCA. 2. Normal LV function  Have discussed with Dr. Excell Seltzer who will preform PCI on mid LAD and prox. RCA.   James [name], James [name]., MD, [facility] 08/03/2013, 12:20 PM Office - [phone] Pager [phone]

## 2013-08-03 NOTE — Progress Notes (Signed)
UR completed. Patient changed to inpatient r/t +cardiac enzymes and requiring IV heparin gtt.

## 2013-08-03 NOTE — Interval H&P Note (Signed)
History and Physical Interval Note:  08/03/2013 11:42 AM  James [name]  has presented today for surgery, with the diagnosis of Chest pain  The various methods of treatment have been discussed with the patient and family. After consideration of risks, benefits and other options for treatment, the patient has consented to  Procedure(s): LEFT HEART CATHETERIZATION WITH CORONARY ANGIOGRAM (N/A) as a surgical intervention .  The patient's history has been reviewed, patient examined, no change in status, stable for surgery.  I have reviewed the patient's chart and labs.  Questions were answered to the patient's satisfaction.    Cath Lab Visit (complete for each Cath Lab visit)  Clinical Evaluation Leading to the Procedure:   ACS: yes  Non-ACS:    Anginal Classification: CCS IV  Anti-ischemic medical therapy: Minimal Therapy (1 class of medications)  Non-Invasive Test Results: No non-invasive testing performed  Prior CABG: No previous CABG       Elyn Aquas.

## 2013-08-03 NOTE — Progress Notes (Addendum)
[facility] PROGRESS NOTE  KYIAN OBST UJW:[id number] DOB: 1936-07-14 DOA: 08/02/2013 PCP: Londell Moh, MD  Assessment/Plan: 1. NSTEMI; continue with Heparin Gtt. Aspirin, nitroglycerin.  2. Atria flutter; Rate controlled.  3. Bladder Cancer: Patient is receiving chemotherapy at  [facility]    Code Status: Full Code.  Family Communication: Care discussed with patient.  Disposition Plan: Remain inpatient.    Consultants:  CArdiology  Procedures:  none  Antibiotics:  none  HPI/Subjective: No chest pain or dyspnea.   Objective: Filed Vitals:   08/03/13 0436  BP: 108/57  Pulse: 66  Temp: 97.9 F (36.6 C)  Resp: 18    Intake/Output Summary (Last 24 hours) at 08/03/13 1019 Last data filed at 08/03/13 0700  Gross per 24 hour  Intake    240 ml  Output   1575 ml  Net  -1335 ml   Filed Weights   08/02/13 2116 08/03/13 0436 08/03/13 1001  Weight: 113.4 kg (250 lb) 113.445 kg (250 lb 1.6 oz) 112.22 kg (247 lb 6.4 oz)    Exam:   General: no distress.  Cardiovascular: S 1, S 2 RRR  Respiratory: CTA   Abdomen: Bs present, soft, NT  Musculoskeletal: no edema.   Data Reviewed: Basic Metabolic Panel:  Recent Labs Lab 08/02/13 1133 08/03/13 0433  NA 135 136  K 4.3 4.0  CL 99 101  CO2 26 24  GLUCOSE 116* 146*  BUN 26* 23  CREATININE 1.31 1.35  CALCIUM 8.2* 8.3*   Liver Function Tests:  Recent Labs Lab 08/02/13 1133 08/03/13 0433  AST 23 34  ALT 28 25  ALKPHOS 63 64  BILITOT 0.4 0.3  PROT 6.8 6.6  ALBUMIN 3.4* 3.2*   No results found for this basename: LIPASE, AMYLASE,  in the last 168 hours No results found for this basename: AMMONIA,  in the last 168 hours CBC:  Recent Labs Lab 08/02/13 1133 08/03/13 0433  WBC 2.5* 2.7*  NEUTROABS 1.5*  --   HGB 11.8* 11.3*  HCT 34.6* 34.3*  MCV 90.8 91.5  PLT 230 217   Cardiac Enzymes:  Recent Labs Lab 08/02/13 1133 08/02/13 1856 08/02/13 2255 08/03/13 0433   CKTOTAL  --  227 281* 249*  CKMB  --  13.1* 15.1* 13.2*  TROPONINI <0.30 2.27* 5.18* 5.37*   BNP (last 3 results)  Recent Labs  08/02/13 1133  PROBNP 79.7   CBG: No results found for this basename: GLUCAP,  in the last 168 hours  No results found for this or any previous visit (from the past 240 hour(s)).   Studies: Ct Angio Chest Pe W/cm &/or Wo Cm  08/02/2013   CLINICAL DATA:  Chest pain  EXAM: CT ANGIOGRAPHY CHEST WITH CONTRAST  TECHNIQUE: Multidetector CT imaging of the chest was performed using the standard protocol during bolus administration of intravenous contrast. Multiplanar CT image reconstructions including MIPs were obtained to evaluate the vascular anatomy.  CONTRAST:  OMNIPAQUE IOHEXOL 350 MG/ML SOLN  COMPARISON:  Chest x-ray from today  FINDINGS: Negative for pulmonary embolism. Negative for aortic dissection. Coronary artery calcification. Heart size within normal limits.  The lungs are clear. Negative for lung mass. No infiltrate or effusion.  No mediastinal or hilar adenopathy. Retrocrural lymph nodes on the right are prominent, measuring 22 x 13 mm, and 23 x 13 mm. Bilateral hepatic cysts are present. No other adenopathy in the upper abdomen.  Review of the MIP images confirms the above findings.  IMPRESSION: Negative for pulmonary  embolism.  No acute abnormality in the chest.  Retrocrural adenopathy which could be due to metastatic disease or benign hypertrophy. No evidence of primary malignancy on this study.   Electronically Signed   By: Marlan Palau M.D.   On: 08/02/2013 15:17   Dg Chest Port 1 View  08/02/2013   CLINICAL DATA:  Chest pain, history bladder cancer, cardiac ablation  EXAM: PORTABLE CHEST - 1 VIEW  COMPARISON:  Portable exam 1153 hr compared 07/17/2013  FINDINGS: Right jugular Port-A-Cath, tip projecting over SVC.  Upper normal heart size.  Tortuous aorta.  Mediastinal contours and pulmonary vascularity normal.  Lungs clear.  No pleural effusion  or pneumothorax.  Bones unremarkable.  IMPRESSION: No acute abnormalities.   Electronically Signed   By: Ulyses Southward M.D.   On: 08/02/2013 12:20    Scheduled Meds: . [COMPLETED] aspirin  81 mg Oral Pre-Cath  . aspirin EC  325 mg Oral Daily  . famotidine  20 mg Oral BID  . nitroGLYCERIN  0.2 mg Transdermal Q24H  . sertraline  150 mg Oral BH-q7a  . sodium chloride  3 mL Intravenous Q12H  . venlafaxine XR  150 mg Oral Q breakfast   Continuous Infusions: . sodium chloride 1 mL/kg/hr (08/03/13 1015)  . heparin 1,400 Units/hr (08/03/13 0830)    Principal Problem:   Chest pain Active Problems:   Atrial flutter   Bladder cancer    Time spent: 30 minutes.     Karson Reede  [facility] Pager [phone]. If 7PM-7AM, please contact night-coverage at [email or web address], password [facility] 08/03/2013, 10:19 AM  LOS: 1 day   Patient S/P cath, S/P stent. Cardiology will take over. Please call with questions. Discussed with Dr Elease Hashimoto.

## 2013-08-03 NOTE — Progress Notes (Signed)
CARDIAC REHAB PHASE I   Pt just out of cath lab. Ed completed with wife and daughter present. Voiced understanding with appropriate questions. Interested in [facility] and will send referral to [place] [facility]. [phone]  Harriet Masson CES, ACSM 08/03/2013 3:50 PM

## 2013-08-03 NOTE — H&P (View-Only) (Signed)
PROGRESS NOTE  Subjective:   Pt was admitted with CP, now has + Troponin levels c/w NSTEMI  Objective:    Vital Signs:   Temp:  [97.9 F (36.6 C)-98.8 F (37.1 C)] 97.9 F (36.6 C) (12/24 0436) Pulse Rate:  [66-80] 66 (12/24 0436) Resp:  [10-18] 18 (12/24 0436) BP: (108-134)/(57-93) 108/57 mmHg (12/24 0436) SpO2:  [95 %-100 %] 99 % (12/24 0436) Weight:  [250 lb (113.4 kg)-250 lb 1.6 oz (113.445 kg)] 250 lb 1.6 oz (113.445 kg) (12/24 0436)      24-hour weight change: Weight change:   Weight trends: Filed Weights   08/02/13 2116 08/03/13 0436  Weight: 250 lb (113.4 kg) 250 lb 1.6 oz (113.445 kg)    Intake/Output:  12/23 0701 - 12/24 0700 In: 240 [P.O.:240] Out: 1575 [Urine:1575]     Physical Exam: BP 108/57  Pulse 66  Temp(Src) 97.9 F (36.6 C) (Oral)  Resp 18  Ht 5' 8.9" (1.75 m)  Wt 250 lb 1.6 oz (113.445 kg)  BMI 37.04 kg/m2  SpO2 99%  General: Vital signs reviewed and noted.   Head: Normocephalic, atraumatic.  Eyes: conjunctivae/corneas clear.  EOM's intact.   Throat: normal  Neck:  normal   Lungs:    clear  Heart:  rr, normal S1, S2  Abdomen:  Soft, non-tender, non-distended    Extremities:    Neurologic: A&O X3, CN II - XII are grossly intact.   Psych: Normal     Labs: BMET:  Recent Labs  08/02/13 1133 08/03/13 0433  NA 135 136  K 4.3 4.0  CL 99 101  CO2 26 24  GLUCOSE 116* 146*  BUN 26* 23  CREATININE 1.31 1.35  CALCIUM 8.2* 8.3*    Liver function tests:  Recent Labs  08/02/13 1133 08/03/13 0433  AST 23 34  ALT 28 25  ALKPHOS 63 64  BILITOT 0.4 0.3  PROT 6.8 6.6  ALBUMIN 3.4* 3.2*   No results found for this basename: LIPASE, AMYLASE,  in the last 72 hours  CBC:  Recent Labs  08/02/13 1133 08/03/13 0433  WBC 2.5* 2.7*  NEUTROABS 1.5*  --   HGB 11.8* 11.3*  HCT 34.6* 34.3*  MCV 90.8 91.5  PLT 230 217    Cardiac Enzymes:  Recent Labs  08/02/13 1133 08/02/13 1856 08/02/13 2255 08/03/13 0433    CKTOTAL  --  227 281* 249*  CKMB  --  13.1* 15.1* 13.2*  TROPONINI <0.30 2.27* 5.18* 5.37*    Coagulation Studies:  Recent Labs  08/02/13 2255  LABPROT 12.5  INR 0.95    Other: No components found with this basename: POCBNP,   Recent Labs  08/02/13 1300  DDIMER 1.09*   No results found for this basename: HGBA1C,  in the last 72 hours  Recent Labs  08/02/13 1856  CHOL 129  HDL 37*  LDLCALC 71  TRIG 161  CHOLHDL 3.5   No results found for this basename: TSH, T4TOTAL, FREET3, T3FREE, THYROIDAB,  in the last 72 hours No results found for this basename: VITAMINB12, FOLATE, FERRITIN, TIBC, IRON, RETICCTPCT,  in the last 72 hours   Other results:  EKG :  NSR with 1st degree AV block.  No St or T wave changes   Medications:    Infusions: . heparin 1,400 Units/hr (08/03/13 0830)    Scheduled Medications: . aspirin EC  325 mg Oral Daily  . famotidine  20 mg Oral BID  . nitroGLYCERIN  0.2 mg Transdermal Q24H  . sertraline  150 mg Oral BH-q7a  . venlafaxine XR  150 mg Oral Q breakfast    Assessment/ Plan:    1. NSTEMI:  Patient with CP and + troponins c/w NSTEMI.  Plan is for cath today.  Discussed risks, benefits, options.  He has metastatic bladder cancer but no hx of hematura.      Disposition:  Length of Stay: 1  Vesta Mixer, Montez Hageman., MD, [facility] 08/03/2013, 9:49 AM Office [phone] Pager [phone]

## 2013-08-04 ENCOUNTER — Telehealth: Payer: Self-pay | Admitting: Nurse Practitioner

## 2013-08-04 ENCOUNTER — Other Ambulatory Visit: Payer: Self-pay | Admitting: Nurse Practitioner

## 2013-08-04 DIAGNOSIS — G4730 Sleep apnea, unspecified: Secondary | ICD-10-CM

## 2013-08-04 LAB — BASIC METABOLIC PANEL
BUN: 18 mg/dL (ref 6–23)
CO2: 26 mEq/L (ref 19–32)
Calcium: 8.2 mg/dL — ABNORMAL LOW (ref 8.4–10.5)
Chloride: 100 mEq/L (ref 96–112)
Creatinine, Ser: 1.3 mg/dL (ref 0.50–1.35)
GFR calc Af Amer: 60 mL/min — ABNORMAL LOW (ref >90)
GFR calc non Af Amer: 52 mL/min — ABNORMAL LOW (ref >90)
Glucose, Bld: 115 mg/dL — ABNORMAL HIGH (ref 70–99)
Potassium: 4.5 mEq/L (ref 3.5–5.1)
Sodium: 134 mEq/L — ABNORMAL LOW (ref 135–145)

## 2013-08-04 LAB — CBC
HCT: 32.8 % — ABNORMAL LOW (ref 39.0–52.0)
Hemoglobin: 10.8 g/dL — ABNORMAL LOW (ref 13.0–17.0)
MCH: 30 pg (ref 26.0–34.0)
MCHC: 32.9 g/dL (ref 30.0–36.0)
MCV: 91.1 fL (ref 78.0–100.0)
Platelets: 178 10*3/uL (ref 150–400)
RBC: 3.6 MIL/uL — ABNORMAL LOW (ref 4.22–5.81)
RDW: 14.2 % (ref 11.5–15.5)
WBC: 1.8 10*3/uL — ABNORMAL LOW (ref 4.0–10.5)

## 2013-08-04 MED ORDER — NITROGLYCERIN 0.4 MG SL SUBL: 0.4000 mg | SUBLINGUAL_TABLET | SUBLINGUAL | Status: DC | PRN

## 2013-08-04 MED ORDER — FAMOTIDINE 20 MG PO TABS: 20.0000 mg | ORAL_TABLET | Freq: Two times a day (BID) | ORAL | Status: DC

## 2013-08-04 MED ORDER — METOPROLOL TARTRATE 12.5 MG HALF TABLET
12.5000 mg | ORAL_TABLET | Freq: Two times a day (BID) | ORAL | Status: DC
  Administered 2013-08-04: 12.5 mg via ORAL
  Filled 2013-08-04 (×2): qty 1

## 2013-08-04 MED ORDER — ATORVASTATIN CALCIUM 40 MG PO TABS: 40.0000 mg | ORAL_TABLET | Freq: Every day | ORAL | Status: DC

## 2013-08-04 MED ORDER — METOPROLOL TARTRATE 12.5 MG HALF TABLET: 12.5000 mg | ORAL_TABLET | Freq: Two times a day (BID) | ORAL | Status: DC

## 2013-08-04 MED ORDER — CLOPIDOGREL BISULFATE 75 MG PO TABS: 75.0000 mg | ORAL_TABLET | Freq: Every day | ORAL | Status: DC

## 2013-08-04 MED ORDER — ASPIRIN 325 MG PO TBEC: 325.0000 mg | DELAYED_RELEASE_TABLET | Freq: Every day | ORAL | Status: DC

## 2013-08-04 NOTE — Discharge Summary (Signed)
Physician Discharge Summary  Patient ID: James [name] MRN: [id number] DOB/AGE: [birth year] 75 y.o.  Admit date: 08/02/2013 Discharge date: 08/04/2013  Primary Discharge Diagnosis 1. NSTEMI: S/P cardiac cath with stenting of the mid LAD and prox RCA.   Secondary Discharge Diagnosis 1. Metastatic Bladder Cancer 2. Atrial flutter s/p ablation in 2012.  Significant Diagnostic Studies:  1. Cardiac Cath:  08/03/2013 Left Main: large and ectatic,  Left anterior Descending: mid 95% stenosis, associated with thrombus. The distal LAD has a 50% stenosis. Diffuse moderate irregularities throughout.  Left Circumflex: mod Diffuse Disease , large 1st OM that is occluded at the distal aspect. 2nd OM is large . Mild irregularities.  Right Coronary Artery: Large and dominant, Proximal 80% stenosis. , mid 50% stenosis. Moderate disease in PDA and PLSA  LV Gram: normal LV function  Complications: No apparent complications  Patient did tolerate procedure well.  Conclusions:  1. 2 vessel CAD involving the mid LAD and proximal RCA.  2. Normal LV function  2. PTCA 08/03/2013 Lesion Data:  Lesion 1  Vessel: LAD/mid  Percent stenosis (pre): 95  TIMI-flow (pre): 3  Stent: 4.0x12 mm bare metal Rebel  Percent stenosis (post): 0  TIMI-flow (post): 3  Lesion 2  Vessel: RCA/prox  Percent stenosis (pre): 90  TIMI-flow (pre): 3  Stent: 4.0x15 mm Rebel BMS  Percent stenosis (post): 0  TIMI-flow (post): 3  Consults: Internal Medicine   Hospital Course: 77 y/o patient admitted with chest pain with no history of CAD. Has a history of atrial fibrillation status post ablation in 2012, with kidney and bladder cancer with metastasis. He is undergoing chemotherapy at [facility]. Chest pain was described as pressure on the left side radiating toward the back without palpitations or diaphoresis. EKG was negative for ACS. However with recurrent discomfort the patient was planned for cardiac  catheterization.   This was completed by Dr. Eldred Manges her on 08/03/2013 demonstrating 2 vessel CAD involving the mid LAD and proximal right coronary artery with normal LV function. He had subsequent PTCA and PCI completed by Dr. Tonny Bollman the same day. A bare-metal stent was placed to the mid LAD, 4.0 x 2 mm rectal; and a Rebel bare-metal stent was placed to the proximal right coronary artery. The patient tolerated the procedures well was placed on dual antiplatelet therapy and monitored overnight. The patient was seen by cardiac rehabilitation, with referral for posthospitalization rehabilitation completed. The patient was placed on statin therapy. Low-dose beta blocker. He will followup with Dr. Gilles Chiquito or or Norma Fredrickson NP post-hospitalization in our office. He was seen and examined by Dr. Bennetta Laos her and found to be stable to return home. Prescriptions were provided.     Discharge Exam: Blood pressure 120/68, pulse 71, temperature 98.4 F (36.9 C), temperature source Oral, resp. rate 15, height 5' 8.9" (1.75 m), weight 248 lb 7.3 oz (112.7 kg), SpO2 100.00%.    Labs:   Lab Results  Component Value Date   WBC 1.8* 08/04/2013   HGB 10.8* 08/04/2013   HCT 32.8* 08/04/2013   MCV 91.1 08/04/2013   PLT 178 08/04/2013    Recent Labs Lab 08/03/13 0433 08/04/13 0525  NA 136 134*  K 4.0 4.5  CL 101 100  CO2 24 26  BUN 23 18  CREATININE 1.35 1.30  CALCIUM 8.3* 8.2*  PROT 6.6  --   BILITOT 0.3  --   ALKPHOS 64  --   ALT 25  --   AST  34  --   GLUCOSE 146* 115*   Lab Results  Component Value Date   CKTOTAL 249* 08/03/2013   CKMB 13.2* 08/03/2013   TROPONINI 5.37* 08/03/2013    Lab Results  Component Value Date   CHOL 129 08/02/2013   Lab Results  Component Value Date   HDL 37* 08/02/2013   Lab Results  Component Value Date   LDLCALC 71 08/02/2013   Lab Results  Component Value Date   TRIG 103 08/02/2013   Lab Results  Component Value Date   CHOLHDL 3.5 08/02/2013     No results found for this basename: LDLDIRECT      Radiology: Dg Chest 2 View  07/17/2013   CLINICAL DATA:  History of bladder malignancy on chemotherapy now with 1 day history of fever, previous history of tobacco use.  EXAM: CHEST  2 VIEW  COMPARISON:  Chest x-ray dated dated July 16, 2013, and March 31, 2009  FINDINGS: The lungs are mildly hyperinflated. There is hazy density in the region of the lingula on the frontal film which is not clearly evident on the lateral film. There is a trace of blunting of the posterior costophrenic angle on the left. There is minimally increased density in the lung parenchyma on the right in the infrahilar region above the hemidiaphragm without obscure a shin. This is slightly less conspicuous than on today's earlier study. On the lateral film no definite right middle lobe or lower lobe infiltrate is suspected. The cardiopericardial silhouette is normal in size. The pulmonary vascularity is not engorged. An 38 Port-A-Cath type appliance is in place with the tip in the region of the distal SVC. There is tortuosity of the descending thoracic aorta which is stable. There are mild degenerative disc changes of the thoracic spine.  IMPRESSION: There is subtle density obscuring the dome of the left hemidiaphragm the suggests lingular atelectasis. There may be a trace of pleural fluid on the left is well. Minimal increased infrahilar lung markings on the right are present but are of questionable significance.   Electronically Signed   By: David  [place]   On: 07/17/2013 09:17   Dg Chest 2 View  07/16/2013   CLINICAL DATA:  Status post first round of chemotherapy for bladder cancer; fatigue and decreased appetite. Fever.  EXAM: CHEST  2 VIEW  COMPARISON:  Chest radiograph performed 03/31/2009  FINDINGS: The lungs are well-aerated. Mild vascular congestion is noted. Mild left basilar opacity may reflect atelectasis or less likely mild pneumonia, depending on the patient's  symptoms. There is no evidence of pleural effusion or pneumothorax. A right-sided chest port is noted ending about the cavoatrial junction.  The heart is normal in size; the mediastinal contour is within normal limits. No acute osseous abnormalities are seen.  IMPRESSION: 1. Mild left basilar opacity may reflect atelectasis or less likely mild pneumonia, depending on the patient's symptoms. 2. Mild vascular congestion noted.   Electronically Signed   By: Roanna Raider M.D.   On: 07/16/2013 22:54   Ct Angio Chest Pe W/cm &/or Wo Cm  08/02/2013   CLINICAL DATA:  Chest pain  EXAM: CT ANGIOGRAPHY CHEST WITH CONTRAST  TECHNIQUE: Multidetector CT imaging of the chest was performed using the standard protocol during bolus administration of intravenous contrast. Multiplanar CT image reconstructions including MIPs were obtained to evaluate the vascular anatomy.  CONTRAST:  OMNIPAQUE IOHEXOL 350 MG/ML SOLN  COMPARISON:  Chest x-ray from today  FINDINGS: Negative for pulmonary embolism. Negative for  aortic dissection. Coronary artery calcification. Heart size within normal limits.  The lungs are clear. Negative for lung mass. No infiltrate or effusion.  No mediastinal or hilar adenopathy. Retrocrural lymph nodes on the right are prominent, measuring 22 x 13 mm, and 23 x 13 mm. Bilateral hepatic cysts are present. No other adenopathy in the upper abdomen.  Review of the MIP images confirms the above findings.  IMPRESSION: Negative for pulmonary embolism.  No acute abnormality in the chest.  Retrocrural adenopathy which could be due to metastatic disease or benign hypertrophy. No evidence of primary malignancy on this study.   Electronically Signed   By: Marlan Palau M.D.   On: 08/02/2013 15:17   Dg Chest Port 1 View  08/02/2013   CLINICAL DATA:  Chest pain, history bladder cancer, cardiac ablation  EXAM: PORTABLE CHEST - 1 VIEW  COMPARISON:  Portable exam 1153 hr compared 07/17/2013  FINDINGS: Right jugular  Port-A-Cath, tip projecting over SVC.  Upper normal heart size.  Tortuous aorta.  Mediastinal contours and pulmonary vascularity normal.  Lungs clear.  No pleural effusion or pneumothorax.  Bones unremarkable.  IMPRESSION: No acute abnormalities.   Electronically Signed   By: Ulyses Southward M.D.   On: 08/02/2013 12:20    EKG:NSR with lst degree AV block, rate of 66 bpm.  FOLLOW UP PLANS AND APPOINTMENTS Discharge Orders   Future Orders Complete By Expires   Amb Referral to Cardiac Rehabilitation  As directed    Diet - low sodium heart healthy  As directed    Increase activity slowly  As directed        Medication List    STOP taking these medications       ondansetron 8 MG tablet  Commonly known as:  ZOFRAN      TAKE these medications       aspirin 325 MG EC tablet  Take 1 tablet (325 mg total) by mouth daily.     clopidogrel 75 MG tablet  Commonly known as:  PLAVIX  Take 1 tablet (75 mg total) by mouth daily with breakfast.     dexamethasone 4 MG tablet  Commonly known as:  DECADRON  Take 4 mg by mouth 2 (two) times daily with a meal. On days 2, 3, 9, and 10 of cycle following chemo  ( day 1 of cycle was 12.4.14)     famotidine 20 MG tablet  Commonly known as:  PEPCID  Take 1 tablet (20 mg total) by mouth 2 (two) times daily.     lidocaine-prilocaine cream  Commonly known as:  EMLA  Apply 1 application topically as needed (for port).     metoprolol tartrate 12.5 mg Tabs tablet  Commonly known as:  LOPRESSOR  Take 0.5 tablets (12.5 mg total) by mouth 2 (two) times daily.     nitroGLYCERIN 0.4 MG SL tablet  Commonly known as:  NITROSTAT  Place 1 tablet (0.4 mg total) under the tongue every 5 (five) minutes as needed for chest pain.     sertraline 100 MG tablet  Commonly known as:  ZOLOFT  Take 150 mg by mouth every morning.     venlafaxine XR 150 MG 24 hr capsule  Commonly known as:  EFFEXOR-XR  Take 150 mg by mouth daily with breakfast.         Time spent  with patient to include physician time:35 minutes Signed: Joni Reining 08/04/2013, 9:02 AM Co-Sign MD   Attending Note:   The patient was  seen and examined.  Agree with assessment and plan as noted above.  Changes made to the above note as needed.  See my previouis note   Alvia Grove., MD, [facility] 08/04/2013, 9:38 AM

## 2013-08-04 NOTE — Progress Notes (Signed)
PROGRESS NOTE  Subjective:   Pt was admitted with CP, now has + Troponin levels c/w NSTEMI,   Cath yesterday revealed a tight mid LAD stenosis and a moderately tight prox RCA  He had a 4.0 x 12 mm Rebel Bare metal stent placed in the mid LAD and a 4.0 x 15 mm Rebel bare metal stent in the prox RCA.   Objective:    Vital Signs:   Temp:  [97.7 F (36.5 C)-99 F (37.2 C)] 98.4 F (36.9 C) (12/25 0544) Pulse Rate:  [70-79] 71 (12/25 0544) Resp:  [15-18] 15 (12/25 0544) BP: (94-139)/(59-86) 120/68 mmHg (12/25 0544) SpO2:  [97 %-100 %] 100 % (12/25 0544) Weight:  [247 lb 6.4 oz (112.22 kg)-248 lb 7.3 oz (112.7 kg)] 248 lb 7.3 oz (112.7 kg) (12/25 0035)      24-hour weight change: Weight change: -2 lb 9.6 oz (-1.18 kg)  Weight trends: Filed Weights   08/03/13 0436 08/03/13 1001 08/04/13 0035  Weight: 250 lb 1.6 oz (113.445 kg) 247 lb 6.4 oz (112.22 kg) 248 lb 7.3 oz (112.7 kg)    Intake/Output:  12/24 0701 - 12/25 0700 In: 240 [P.O.:240] Out: 2475 [Urine:2475]     Physical Exam: BP 120/68  Pulse 71  Temp(Src) 98.4 F (36.9 C) (Oral)  Resp 15  Ht 5' 8.9" (1.75 m)  Wt 248 lb 7.3 oz (112.7 kg)  BMI 36.80 kg/m2  SpO2 100%  General: Vital signs reviewed and noted.   Head: Normocephalic, atraumatic.  Eyes: conjunctivae/corneas clear.  EOM's intact.   Throat: normal  Neck:  normal   Lungs:   clear  Heart:  rr, normal S1, S2  Abdomen:  Soft, non-tender, non-distended    Extremities:    Neurologic: A&O X3, CN II - XII are grossly intact.   Psych: Normal     Labs: BMET:  Recent Labs  08/03/13 0433 08/04/13 0525  NA 136 134*  K 4.0 4.5  CL 101 100  CO2 24 26  GLUCOSE 146* 115*  BUN 23 18  CREATININE 1.35 1.30  CALCIUM 8.3* 8.2*    Liver function tests:  Recent Labs  08/02/13 1133 08/03/13 0433  AST 23 34  ALT 28 25  ALKPHOS 63 64  BILITOT 0.4 0.3  PROT 6.8 6.6  ALBUMIN 3.4* 3.2*   No results found for this basename: LIPASE, AMYLASE,   in the last 72 hours  CBC:  Recent Labs  08/02/13 1133 08/03/13 0433 08/04/13 0525  WBC 2.5* 2.7* 1.8*  NEUTROABS 1.5*  --   --   HGB 11.8* 11.3* 10.8*  HCT 34.6* 34.3* 32.8*  MCV 90.8 91.5 91.1  PLT 230 217 178    Cardiac Enzymes:  Recent Labs  08/02/13 1133 08/02/13 1856 08/02/13 2255 08/03/13 0433  CKTOTAL  --  227 281* 249*  CKMB  --  13.1* 15.1* 13.2*  TROPONINI <0.30 2.27* 5.18* 5.37*    Coagulation Studies:  Recent Labs  08/02/13 2255  LABPROT 12.5  INR 0.95    Other: No components found with this basename: POCBNP,   Recent Labs  08/02/13 1300  DDIMER 1.09*   No results found for this basename: HGBA1C,  in the last 72 hours  Recent Labs  08/02/13 1856  CHOL 129  HDL 37*  LDLCALC 71  TRIG 010  CHOLHDL 3.5   No results found for this basename: TSH, T4TOTAL, FREET3, T3FREE, THYROIDAB,  in the last 72 hours No results found for  this basename: VITAMINB12, FOLATE, FERRITIN, TIBC, IRON, RETICCTPCT,  in the last 72 hours   Other results:  EKG :  NSR with 1st degree AV block.  No St or T wave changes   Medications:    Infusions: . sodium chloride      Scheduled Medications: . aspirin EC  325 mg Oral Daily  . clopidogrel  75 mg Oral Q breakfast  . famotidine  20 mg Oral BID  . nitroGLYCERIN  0.2 mg Transdermal Q24H  . sertraline  150 mg Oral BH-q7a  . venlafaxine XR  150 mg Oral Q breakfast    Assessment/ Plan:    1. CAD:  Patient with CP and + troponins c/w NSTEMI.  Now s/p stenting of mid LAD and prox RCA.    His lipid levels are not all that bad but is is clear that he need lower cholesterol levels.     Total chol = 129 Trig = 103 HDL = 37 LDL =71  OK to go home today after he ambulates with rehab.  .   Can see me in several weeks for follow up visit.  Alternatively, can see Norma Fredrickson if my schedule is full.   Plavix 75 Asa Metoprolol 12. 5 bid  Atorvastatin 40 NTG prn Efferor - xr 150  ? Other home meds.   He has  metastatic bladder cancer but no hx of hematura.    please send a copy of DC summary to : Tomasita Morrow, MD , [facility]    Disposition:  Length of Stay: 2  Vesta Mixer, Montez Hageman., MD, [facility] 08/04/2013, 8:31 AM Office [phone] Pager [phone]

## 2013-08-04 NOTE — Telephone Encounter (Signed)
Pts wife called this afternoon to report that meds were sent to [facility] however this is closed for the remainder of the day.  She will be able to pick up meds in AM from [facility].  He has already received asa, plavix, lopressor, pepcid, in the hospital prior to d/c.  He was Rx a statin at discharge however this was apparently called in and is not on the AVS.  I've reviewed Dr. Harvie Bridge rounding note from today and he was to be d/c'd on lipitor 40.  I have gone ahead and eRx a scrip for lipitor 40mg  1 po daily, #30, 6 refills to the pts usual [facility].  He will pick it up tomorrow.  I stressed the importance of being sure to get asa/plavix first thing tomorrow morning and his wife verbalized understanding.  We also discussed asa dose.  I recommended that he drop the dose to 81mg  daily (325 was listed on AVS).

## 2013-08-05 ENCOUNTER — Other Ambulatory Visit: Payer: Self-pay | Admitting: *Deleted

## 2013-08-05 ENCOUNTER — Telehealth: Payer: Self-pay | Admitting: Cardiovascular Disease

## 2013-08-05 MED FILL — Sodium Chloride IV Soln 0.9%: INTRAVENOUS | Qty: 250 | Status: AC

## 2013-08-05 NOTE — Telephone Encounter (Addendum)
Wife called for 2 reasons.  Wanted to clarify dose of ASA.  When he was d/c from hospital yesterday was told to take ASA 325 mg by PA.  After they got home they called the MD on call who suggested that 81 mg of ASA should be fine to take.  Pt is currently undergoing Chemo.  Wife states she is going to keep him on the 81 mg of ASA because his risk of bleeding from chemo.  Advised that I will forward to [name] but he will be out of office next week.  Also made her an app w/Scott Weaver,PA for 1/6 as post cath app.

## 2013-08-05 NOTE — Telephone Encounter (Signed)
Follow up    Pt's wife returning our call please give them a call back please.

## 2013-08-05 NOTE — Telephone Encounter (Signed)
lmtcb

## 2013-08-05 NOTE — Telephone Encounter (Signed)
Rx clarification for [facility].

## 2013-08-05 NOTE — Telephone Encounter (Signed)
New Message  Pt wife called.. Requests a call back with clarification of the Asprin dosage prescribed at the hospital/// Please assist.

## 2013-08-06 ENCOUNTER — Telehealth: Payer: Self-pay | Admitting: Nurse Practitioner

## 2013-08-06 NOTE — Telephone Encounter (Signed)
Pts wife called b/c pt has had some diarrhea today.  We reviewed meds and I suggested that the meds that he is on do not commonly cause diarrhea.  I rec immodium prn, which can be obtained otc.  Pt and wife verbalized understanding.  If diarrhea persists, they will call back.

## 2013-08-08 ENCOUNTER — Telehealth: Payer: Self-pay | Admitting: Cardiovascular Disease

## 2013-08-08 NOTE — Telephone Encounter (Signed)
Wife was told pt has to remain on plavix at least one month and perhaps a year/ told them to update the dentist to see if he should proceed with dental / crown placement.

## 2013-08-08 NOTE — Telephone Encounter (Signed)
New message  Patient is scheduled for a dental procedure tomorrow and has questions regarding to plavix. Please call and advise.

## 2013-08-10 ENCOUNTER — Telehealth: Payer: Self-pay | Admitting: *Deleted

## 2013-08-10 NOTE — Telephone Encounter (Signed)
called pt to advise PA double booked and need to move appt time to 12:10 and not the 8:45 that he was originally scheduled as . Pt said that will be ok and wil see [place] then and Happy New Year.

## 2013-08-16 ENCOUNTER — Encounter: Payer: Self-pay | Admitting: Physician Assistant

## 2013-08-16 ENCOUNTER — Ambulatory Visit (INDEPENDENT_AMBULATORY_CARE_PROVIDER_SITE_OTHER): Payer: Medicare Other | Admitting: Physician Assistant

## 2013-08-16 VITALS — BP 114/68 | HR 69 | Ht 68.0 in | Wt 252.0 lb

## 2013-08-16 DIAGNOSIS — E7800 Pure hypercholesterolemia, unspecified: Secondary | ICD-10-CM

## 2013-08-16 DIAGNOSIS — E785 Hyperlipidemia, unspecified: Secondary | ICD-10-CM | POA: Insufficient documentation

## 2013-08-16 DIAGNOSIS — I2510 Atherosclerotic heart disease of native coronary artery without angina pectoris: Secondary | ICD-10-CM

## 2013-08-16 DIAGNOSIS — I252 Old myocardial infarction: Secondary | ICD-10-CM

## 2013-08-16 MED ORDER — FAMOTIDINE 20 MG PO TABS: 20.0000 mg | ORAL_TABLET | Freq: Two times a day (BID) | ORAL | Status: DC

## 2013-08-16 NOTE — Patient Instructions (Addendum)
Your physician recommends that you continue on your current medications as directed. Please refer to the Current Medication list given to you today.  Your physician recommends that you schedule a follow-up appointment in: Dr Acie Fredrickson in 6 weeks  Return in 5-6 weeks for fasting labs (lp/hfp)  Will arrange Cardiac Rehab, they will be in touch with you

## 2013-08-16 NOTE — Progress Notes (Signed)
[address], [address] [place], [place]  [address] Phone: [phone] Fax:  [phone]  Date:  08/16/2013   ID:  James [name], DOB [birth year], MRN [id number]  PCP:  Horatio Pel, MD  Cardiologist:  Dr. Liam Rogers   Electrophysiologist:  Dr. Virl Axe    History of Present Illness: James [name] is a 78 y.o. male with a hx of AFlutter s/p RFCA in 2012 and metastatic bladder CA followed by [facility].  Echocardiogram February 05, 2011): Mild LVH, EF 55-65%, trivial AI, mild MR, mild to moderate LAE, mild RAE, PASP 31, trivial effusion.    He was admitted 08-26-13-08/04/13 with a NSTEMI.  Chest CTA was negative for pulmonary embolism.  LHC (08/03/13): Mid LAD 95% associated with thrombus, distal LAD 50%, proximal RCA 80% mid RCA 50%, normal LV function. PCI:  Rebel (4 x 12 mm) BMS to the mid LAD and Rebel (4 x 15 mm) BMS to the proximal RCA. Aspirin and Plavix was recommended for a minimum of 30 days or 12 months if tolerated.    He is doing well since d/c.  The patient denies chest pain, shortness of breath, syncope, orthopnea, PND or significant pedal edema.   Recent Labs: 08/26/2013: HDL 37*; LDL (calc) 71; Pro B Natriuretic peptide (BNP) 79.7  08/03/2013: ALT 25  08/04/2013: Creatinine 1.30; Hemoglobin 10.8*; Potassium 4.5 08/12/2013:  K 4.6, creatinine 1.46, ALT 24, Hgb 11.9 ([facility])  Wt Readings from Last 3 Encounters:  08/16/13 252 lb (114.306 kg)  08/04/13 248 lb 7.3 oz (112.7 kg)  08/04/13 248 lb 7.3 oz (112.7 kg)     Past Medical History  Diagnosis Date  . Atrial flutter 05/2009    STATUS POST CARDIOVERSION;  s/p RFCA in 2012 (Dr. Caryl Comes)  . Depression   . History of immunotherapy 2014    "had many times before and it worked; had a severe reaction 2013/02/04; they coded me, I had sepsis; almost died" (2013-08-26)  . Bladder cancer     'started chemo 07/2013 " (26-Aug-2013)  . OSA on CPAP   . Pneumonia   . CAD (coronary artery disease)      a. NSTEMI 07/2013:  LHC (08/03/13): mLAD 95% assoc w/ thrombus, dLAD 50%, pRCA 80%, mRCA 50%, normal LVF. => PCI:  Rebel (4 x 12 mm) BMS to the mLAD and Rebel (4 x 15 mm) BMS to the pRCA.  . Osteoarthritis   . Dyslipidemia     Lipitor started at time of NSTEMI 07/2013    Current Outpatient Prescriptions  Medication Sig Dispense Refill  . ondansetron (ZOFRAN) 8 MG tablet as needed.       No current facility-administered medications for this visit.    Allergies:   Bupropion; Adhesive; Flomax; and Pantoprazole sodium   Social History:  The patient  reports that he has quit smoking. His smoking use included Cigarettes. He has a 22.5 pack-year smoking history. He has never used smokeless tobacco. He reports that he drinks alcohol. He reports that he does not use illicit drugs.   Family History:  The patient's family history includes Coronary artery disease in an other family member; Heart failure in his father; Kidney failure in his mother; Stroke in his father.   ROS:  Please see the history of present illness.   He has occasional bruising.   All other systems reviewed and negative.   PHYSICAL EXAM: VS:  BP 114/68  Pulse 69  Ht 5\' 8"  (1.727  m)  Wt 252 lb (114.306 kg)  BMI 38.33 kg/m2 Well nourished, well developed, in no acute distress HEENT: normal Neck: no JVD Cardiac:  normal S1, S2; RRR; no murmur Lungs:  clear to auscultation bilaterally, no wheezing, rhonchi or rales Abd: soft, nontender, no hepatomegaly Ext: no edema; right wrist without hematoma or mass  Skin: warm and dry Neuro:  CNs 2-12 intact, no focal abnormalities noted  EKG:  NSR, HR 69, normal axis, first-degree AV block (PR 246 ms)   No ST changes  ASSESSMENT AND PLAN:  1. CAD:  Stable without angina after recent PCI to the LAD and RCA in the setting of a NSTEMI.  Continue Plavix and ASA.  We discussed the importance of dual antiplatelet therapy. He will need to remain on both for a minimum of 30 days.   Hopefully, he can remain on these for 12 mos.  Refer to cardiac rehab.   2. Dyslipidemia:  Check Lipids and LFTs in 6 weeks.  Continue statin. 3. Metastatic Bladder CA:  Continue chemotherapy at [facility]. 4. Disposition: F/u with Dr. Liam Rogers in 6 weeks.  Signed, Richardson Dopp, PA-C  08/16/2013 12:45 PM

## 2013-08-18 ENCOUNTER — Encounter (HOSPITAL_COMMUNITY)
Admission: RE | Admit: 2013-08-18 | Discharge: 2013-08-18 | Disposition: A | Discharge status: Home / Self Care | Payer: Medicare Other | Source: Ambulatory Visit | Attending: Cardiovascular Disease | Admitting: Cardiovascular Disease

## 2013-08-18 DIAGNOSIS — Z87891 Personal history of nicotine dependence: Secondary | ICD-10-CM | POA: Insufficient documentation

## 2013-08-18 DIAGNOSIS — Z79899 Other long term (current) drug therapy: Secondary | ICD-10-CM | POA: Insufficient documentation

## 2013-08-18 DIAGNOSIS — I252 Old myocardial infarction: Secondary | ICD-10-CM | POA: Insufficient documentation

## 2013-08-18 DIAGNOSIS — Z5189 Encounter for other specified aftercare: Secondary | ICD-10-CM | POA: Insufficient documentation

## 2013-08-18 DIAGNOSIS — I2510 Atherosclerotic heart disease of native coronary artery without angina pectoris: Secondary | ICD-10-CM | POA: Insufficient documentation

## 2013-08-18 NOTE — Progress Notes (Signed)
Cardiac Rehab Medication Review by a Pharmacist  Does the patient  feel that his/her medications are working for him/her?  YES  Has the patient been experiencing any side effects to the medications prescribed?  NO, not at this time  Does the patient measure his/her own blood pressure or blood glucose at home? YES, tries to  Does the patient have any problems obtaining medications due to transportation or finances?   {NO  Understanding of regimen: good Understanding of indications: good Potential of compliance: good   Pharmacist comments:  Patient has cancer and is currently being treated at [facility] with chemotherapy agents (cisplatin/carboplatin and gemzar).  He just completed a cycle a few weeks ago, and will be starting another cycle tomorrow.  Patient was also very interested in information on supplements and asked for some information.  I directed him to FDA website with some dietary supplement information.  I informed him to first discuss with his MD before starting anything OTC or any supplements.  It is important he notifies his providers because he does have a lot of medications that may interact with supplements.  Patient agrees and states he understands.  He currently has no additional questions.    James [name] 08/18/2013 9:18 AM

## 2013-08-22 ENCOUNTER — Encounter (HOSPITAL_COMMUNITY)
Admission: RE | Admit: 2013-08-22 | Discharge: 2013-08-22 | Disposition: A | Discharge status: Home / Self Care | Payer: Medicare Other | Source: Ambulatory Visit | Attending: Cardiovascular Disease | Admitting: Cardiovascular Disease

## 2013-08-22 ENCOUNTER — Telehealth (HOSPITAL_COMMUNITY): Payer: Self-pay | Admitting: *Deleted

## 2013-08-22 NOTE — Progress Notes (Signed)
Dong reported that his white cell count is low he was not able to receive his cancer treatment at [facility] on Friday. Patient given a mask and advised not to  exercise today. Will get clearance from Dr Nunzio Cobbs whether Mr Radin is okay to exercise. Patient instructed to wash hands.

## 2013-08-24 ENCOUNTER — Encounter (HOSPITAL_COMMUNITY)
Admission: RE | Admit: 2013-08-24 | Discharge: 2013-08-24 | Disposition: A | Discharge status: Home / Self Care | Payer: Medicare Other | Source: Ambulatory Visit | Attending: Cardiovascular Disease | Admitting: Cardiovascular Disease

## 2013-08-24 NOTE — Progress Notes (Addendum)
Pt started cardiac rehab today.  Pt tolerated light exercise without difficulty. Telemetry Sinus rhythm without ectopy.  Vital signs stable. Will continue to monitor the patient throughout  the program. Instructed patient to practice meticulous hand hygiene during exercise.  Patient states understanding.

## 2013-08-26 ENCOUNTER — Encounter (HOSPITAL_COMMUNITY)
Admission: RE | Admit: 2013-08-26 | Discharge: 2013-08-26 | Disposition: A | Discharge status: Home / Self Care | Payer: Medicare Other | Source: Ambulatory Visit | Attending: Cardiovascular Disease | Admitting: Cardiovascular Disease

## 2013-08-29 ENCOUNTER — Encounter (HOSPITAL_COMMUNITY)
Admission: RE | Admit: 2013-08-29 | Discharge: 2013-08-29 | Disposition: A | Discharge status: Home / Self Care | Payer: Medicare Other | Source: Ambulatory Visit | Attending: Cardiovascular Disease | Admitting: Cardiovascular Disease

## 2013-08-29 NOTE — Progress Notes (Signed)
Reviewed home exercise with pt today.  Pt plans to continue to walk and use spin bike at home and return to [employer] at [facility] or exercise.  Reviewed THR, pulse, RPE, sign and symptoms, NTG use, and when to call 911 or MD.  Pt voiced understanding. Alberteen Sam, MA, ACSM RCEP

## 2013-08-31 ENCOUNTER — Encounter (HOSPITAL_COMMUNITY)
Admission: RE | Admit: 2013-08-31 | Discharge: 2013-08-31 | Disposition: A | Discharge status: Home / Self Care | Payer: Medicare Other | Source: Ambulatory Visit | Attending: Cardiovascular Disease | Admitting: Cardiovascular Disease

## 2013-09-02 ENCOUNTER — Encounter (HOSPITAL_COMMUNITY): Admission: RE | Admit: 2013-09-02 | Payer: Medicare Other | Source: Ambulatory Visit

## 2013-09-02 ENCOUNTER — Encounter (HOSPITAL_COMMUNITY)
Admission: RE | Admit: 2013-09-02 | Discharge: 2013-09-02 | Disposition: A | Discharge status: Home / Self Care | Payer: Medicare Other | Source: Ambulatory Visit | Attending: Cardiovascular Disease | Admitting: Cardiovascular Disease

## 2013-09-05 ENCOUNTER — Encounter (HOSPITAL_COMMUNITY)
Admission: RE | Admit: 2013-09-05 | Discharge: 2013-09-05 | Disposition: A | Discharge status: Home / Self Care | Payer: Medicare Other | Source: Ambulatory Visit | Attending: Cardiovascular Disease | Admitting: Cardiovascular Disease

## 2013-09-07 ENCOUNTER — Encounter (HOSPITAL_COMMUNITY)
Admission: RE | Admit: 2013-09-07 | Discharge: 2013-09-07 | Disposition: A | Discharge status: Home / Self Care | Payer: Medicare Other | Source: Ambulatory Visit | Attending: Cardiovascular Disease | Admitting: Cardiovascular Disease

## 2013-09-08 ENCOUNTER — Telehealth: Payer: Self-pay | Admitting: Cardiovascular Disease

## 2013-09-08 DIAGNOSIS — R0600 Dyspnea, unspecified: Secondary | ICD-10-CM

## 2013-09-08 DIAGNOSIS — R609 Edema, unspecified: Secondary | ICD-10-CM

## 2013-09-08 NOTE — Telephone Encounter (Signed)
New problem   Pt is experiencing several problem possible from his medicines. Need to speak to nurse today.

## 2013-09-08 NOTE — Telephone Encounter (Signed)
Pt c/o legs/hands edema last weekend that went away after elevation.  Pt is having chemo/ hgb 09/08/13 10// pt has been drinking a lot of water to flush chemo but is eating salt frequently. Sodium rich foods reviewed and explained importance of avoiding. bp per cardiac rehab has been good/ p 73-76 range. C/o episode of sob and heart racing  with raising from seated position but only on occasion. Would you like to order an Echo? Pt has an app 2/16.

## 2013-09-09 ENCOUNTER — Encounter (HOSPITAL_COMMUNITY)
Admission: RE | Admit: 2013-09-09 | Discharge: 2013-09-09 | Disposition: A | Discharge status: Home / Self Care | Payer: Medicare Other | Source: Ambulatory Visit | Attending: Cardiovascular Disease | Admitting: Cardiovascular Disease

## 2013-09-09 ENCOUNTER — Other Ambulatory Visit: Payer: Medicare Other | Admitting: *Deleted

## 2013-09-09 ENCOUNTER — Ambulatory Visit (HOSPITAL_COMMUNITY)
Admission: RE | Admit: 2013-09-09 | Discharge: 2013-09-09 | Disposition: A | Discharge status: Home / Self Care | Payer: Medicare Other | Source: Ambulatory Visit | Attending: Cardiovascular Disease | Admitting: Cardiovascular Disease

## 2013-09-09 DIAGNOSIS — R0989 Other specified symptoms and signs involving the circulatory and respiratory systems: Secondary | ICD-10-CM

## 2013-09-09 DIAGNOSIS — J984 Other disorders of lung: Secondary | ICD-10-CM | POA: Insufficient documentation

## 2013-09-09 DIAGNOSIS — R0602 Shortness of breath: Secondary | ICD-10-CM

## 2013-09-09 MED ORDER — FUROSEMIDE 40 MG PO TABS: ORAL_TABLET | ORAL | Status: DC

## 2013-09-09 MED ORDER — POTASSIUM CHLORIDE CRYS ER 10 MEQ PO TBCR
EXTENDED_RELEASE_TABLET | ORAL | Status: DC
Start: 2013-09-09 — End: 2013-10-19

## 2013-09-09 NOTE — Telephone Encounter (Signed)
I suspect this edema ( hand and feet ) is due to his chemo and drinking lots of fluids. I would like an echo.  Last echo was 2012.

## 2013-09-09 NOTE — Telephone Encounter (Signed)
Order placed/ wife will come today to schedule echo.papers to check out.

## 2013-09-09 NOTE — Progress Notes (Signed)
CXR normal/  Orders sent for lasix and k+ for 3 days only/ explained new meds, pt agreed to plan. Echo has been scheduled.

## 2013-09-09 NOTE — Progress Notes (Signed)
MARIA RN/cardiac rehab called stating pt's wt has went up 7 lb in 2 weeks/ bilateral fine rhonchi posteriorly heard. Sob w/ exertion. LLE noted. cxr ordered/ pt to go home and we will call back.

## 2013-09-09 NOTE — Progress Notes (Addendum)
James [name] has gained 3.2 kg since starting the program. James [name] reports feeling short of breath with exertion. Upon assessment lungs fields with crackles noted in the right posterior base otherwise lungs are clear.   Positive  1+ lower extremity edema noted .  Oxygen saturation 95-96% on room air. Temperature 98.4. Dr Elmarie Shiley office called and notified spoke with James [name], Dr Elmarie Shiley RN. Dr Acie Fredrickson wants James [name] to get a chest xray. Chest xray completed. Dr Elmarie Shiley office said that James [name] can go home. The office will call the patient at home. Will fax exercise flow sheets to Dr. Elmarie Shiley office for review.

## 2013-09-12 ENCOUNTER — Encounter (HOSPITAL_COMMUNITY)
Admission: RE | Admit: 2013-09-12 | Discharge: 2013-09-12 | Disposition: A | Discharge status: Home / Self Care | Payer: Medicare Other | Source: Ambulatory Visit | Attending: Cardiovascular Disease | Admitting: Cardiovascular Disease

## 2013-09-12 DIAGNOSIS — I252 Old myocardial infarction: Secondary | ICD-10-CM | POA: Insufficient documentation

## 2013-09-12 DIAGNOSIS — I2510 Atherosclerotic heart disease of native coronary artery without angina pectoris: Secondary | ICD-10-CM | POA: Insufficient documentation

## 2013-09-12 DIAGNOSIS — Z79899 Other long term (current) drug therapy: Secondary | ICD-10-CM | POA: Insufficient documentation

## 2013-09-12 DIAGNOSIS — Z87891 Personal history of nicotine dependence: Secondary | ICD-10-CM | POA: Insufficient documentation

## 2013-09-12 DIAGNOSIS — Z5189 Encounter for other specified aftercare: Secondary | ICD-10-CM | POA: Insufficient documentation

## 2013-09-14 ENCOUNTER — Encounter (HOSPITAL_COMMUNITY)
Admission: RE | Admit: 2013-09-14 | Discharge: 2013-09-14 | Disposition: A | Discharge status: Home / Self Care | Payer: Medicare Other | Source: Ambulatory Visit | Attending: Cardiovascular Disease | Admitting: Cardiovascular Disease

## 2013-09-16 ENCOUNTER — Telehealth (HOSPITAL_COMMUNITY): Payer: Self-pay | Admitting: Internal Medicine

## 2013-09-16 ENCOUNTER — Encounter (HOSPITAL_COMMUNITY): Payer: Medicare Other

## 2013-09-16 ENCOUNTER — Telehealth: Payer: Self-pay | Admitting: Cardiovascular Disease

## 2013-09-16 NOTE — Telephone Encounter (Signed)
msg was left at home number that the labs were for cholesterol and liver and do not need to be ordered stat. Asked her to call back with questions.  As I was leaving my msg/ refills was called by wife and requested to talk with me/ MIndy cma came and informed me,  Called her back on cell.  She  is  very agitated/ anxious, she wants additional labs ordered for fatigue and other symptoms, pt is having chemo and wife requests cbc, iron and any other tests to determine the cause for his symptoms.  I explained that rhe additional tests need to come from oncology or pcp, she hung up.

## 2013-09-16 NOTE — Telephone Encounter (Signed)
Follow up     Patient wife calling back to check to see if lab order was sent over [facility] . With the word STAT on it.  Will be there at  10: 15am . Pt wife aware that nurse is in triage today .

## 2013-09-16 NOTE — Telephone Encounter (Signed)
New message     Concerned about husband is very fatigue.  He wants to sleep about 14hrs a day.  Patient lost lab order to go to [facility].  Need another STAT lab order faxed to [facility].  Please fax  STAT order to [phone]. Want to go to lab this am.

## 2013-09-19 ENCOUNTER — Other Ambulatory Visit: Payer: Medicare Other

## 2013-09-19 ENCOUNTER — Telehealth (HOSPITAL_COMMUNITY): Payer: Self-pay | Admitting: Internal Medicine

## 2013-09-19 ENCOUNTER — Encounter (HOSPITAL_COMMUNITY): Payer: Medicare Other

## 2013-09-19 NOTE — Telephone Encounter (Signed)
[facility] is at [facility], would like name of a cardiologist he could see their while he is having chemotherapy. Please advise/ I will call them back Wednesday 2/11

## 2013-09-19 NOTE — Telephone Encounter (Signed)
New problem   Need to speak to nurse concerning integrated care. Please call pt.

## 2013-09-20 ENCOUNTER — Ambulatory Visit (HOSPITAL_COMMUNITY): Payer: Medicare Other | Attending: Cardiovascular Disease | Admitting: Radiology

## 2013-09-20 DIAGNOSIS — I4892 Unspecified atrial flutter: Secondary | ICD-10-CM

## 2013-09-20 DIAGNOSIS — R0989 Other specified symptoms and signs involving the circulatory and respiratory systems: Secondary | ICD-10-CM | POA: Insufficient documentation

## 2013-09-20 DIAGNOSIS — R0600 Dyspnea, unspecified: Secondary | ICD-10-CM

## 2013-09-20 DIAGNOSIS — R0609 Other forms of dyspnea: Secondary | ICD-10-CM | POA: Insufficient documentation

## 2013-09-20 DIAGNOSIS — C639 Malignant neoplasm of male genital organ, unspecified: Secondary | ICD-10-CM | POA: Insufficient documentation

## 2013-09-20 DIAGNOSIS — R609 Edema, unspecified: Secondary | ICD-10-CM

## 2013-09-20 DIAGNOSIS — I059 Rheumatic mitral valve disease, unspecified: Secondary | ICD-10-CM | POA: Insufficient documentation

## 2013-09-20 DIAGNOSIS — E785 Hyperlipidemia, unspecified: Secondary | ICD-10-CM | POA: Insufficient documentation

## 2013-09-20 DIAGNOSIS — E669 Obesity, unspecified: Secondary | ICD-10-CM | POA: Insufficient documentation

## 2013-09-20 DIAGNOSIS — I252 Old myocardial infarction: Secondary | ICD-10-CM | POA: Insufficient documentation

## 2013-09-20 DIAGNOSIS — R079 Chest pain, unspecified: Secondary | ICD-10-CM

## 2013-09-20 DIAGNOSIS — Z87891 Personal history of nicotine dependence: Secondary | ICD-10-CM | POA: Insufficient documentation

## 2013-09-20 DIAGNOSIS — R0602 Shortness of breath: Secondary | ICD-10-CM

## 2013-09-20 NOTE — Progress Notes (Addendum)
Echocardiogram performed. Patient needs echo report called to Dr. Lind Guest. Marcello Moores at Department of Hematology and Oncology at [facility]  [phone], as soon as possible.

## 2013-09-20 NOTE — Telephone Encounter (Signed)
I do not have any names of specific cardiologist at [facility]. His doctor there can make a referral.

## 2013-09-21 ENCOUNTER — Encounter (HOSPITAL_COMMUNITY): Payer: Medicare Other

## 2013-09-21 NOTE — Telephone Encounter (Signed)
Pt's wife called regarding pt. She states pt is having a chemotherapy appointment tomorrow with Dr. Nunzio Cobbs in [facility] Hemo/oncology clinic for Infusion and transfusion treatment . Pt has labor breathing. Pt's wife is calling for the echo result because  The oncology MD wants to know if pt has CHF. Pt's appointment is at 9:00 AM tomorrow morning. Wife would like for Dr. Acie Fredrickson to talk with Dr. Nunzio Cobbs in [facility] regarding the echo result because the [facility] tomorrow would depends on the echo result. Pt is requesting also to Fax the echo result to Fax [phone]. Dr Marcello Moores ph # is [phone] Dr. Acie Fredrickson aware and recommends to fax the echo result to Dr. Marcello Moores in [facility] Because the information is in detail in the echo result. Pt has some diastolic desfuntion but is the normal level. Echo result will be faxed as requested. Pt aware she verbalized understanding

## 2013-09-21 NOTE — Telephone Encounter (Signed)
Follow Up:  Pt's wife is calling to hear the pt's echo results. Pt's wife is also wanting Dr. Acie Fredrickson to reach out to her husbands chemo doctor, Dr. Marcello Moores...   [phone] - Dr. Manon Hilding number

## 2013-09-23 ENCOUNTER — Encounter (HOSPITAL_COMMUNITY): Payer: Medicare Other

## 2013-09-23 ENCOUNTER — Telehealth (HOSPITAL_COMMUNITY): Payer: Self-pay | Admitting: *Deleted

## 2013-09-26 ENCOUNTER — Encounter (HOSPITAL_COMMUNITY): Admission: RE | Admit: 2013-09-26 | Payer: Medicare Other | Source: Ambulatory Visit

## 2013-09-26 ENCOUNTER — Ambulatory Visit (INDEPENDENT_AMBULATORY_CARE_PROVIDER_SITE_OTHER): Payer: Medicare Other | Admitting: Cardiovascular Disease

## 2013-09-26 ENCOUNTER — Encounter: Payer: Self-pay | Admitting: Cardiovascular Disease

## 2013-09-26 VITALS — BP 120/68 | HR 72 | Ht 68.0 in | Wt 258.8 lb

## 2013-09-26 DIAGNOSIS — E785 Hyperlipidemia, unspecified: Secondary | ICD-10-CM

## 2013-09-26 DIAGNOSIS — I2510 Atherosclerotic heart disease of native coronary artery without angina pectoris: Secondary | ICD-10-CM

## 2013-09-26 NOTE — Assessment & Plan Note (Signed)
James [name] seems to be doing fairly well. He's having a difficult time with his chemotherapy for his bladder tumor. He he had to stop his Plavix because of thrombocytopenia. He has been able to continue aspirin.  He's not having any episodes of angina.  He had been participating in cardiac rehabilitation until he became very weak. This occurred at a nadir in his counts. I've encouraged him to get back into cardiac rehabilitation as soon as possible.  We'll have him return for fasting lipids in several weeks. We will see him back in the office in 6 months for followup visit.

## 2013-09-26 NOTE — Progress Notes (Signed)
[place], [address], [place]  [address] Phone: [phone] Fax:  [phone]  Date:  09/26/2013   ID:  James [name], DOB 1935/09/01, MRN [id number]  PCP:  James Pel, MD  Cardiologist:  Dr. Liam [name]   Electrophysiologist:  Dr. Virl [name]     Problem List: 1. Coronary artery disease-status post stenting of his mid LAD and proximal RCA ( 08/03/14)  2. History of atrial flutter-status post radiofrequency ablation in 2012 3. Metastatic bladder cancer 4. Obstructive sleep apnea 5. Dyslipidemia  History of Present Illness: James [name] is a 78 y.o. male with a hx of AFlutter s/p RFCA in 2012 and metastatic bladder CA followed by [facility]: [place] James [name].  Echocardiogram 02-01-11): Mild LVH, EF 55-65%, trivial AI, mild MR, mild to moderate LAE, mild RAE, PASP 31, trivial effusion.    He was admitted 2013/08/21-12/25/14 with a NSTEMI.  Chest CTA was negative for pulmonary embolism.  LHC (08/03/13): Mid LAD 95% associated with thrombus, distal LAD 50%, proximal RCA 80% mid RCA 50%, normal LV function. PCI:  Rebel (4 x 12 mm) BMS to the mid LAD and Rebel (4 x 15 mm) BMS to the proximal RCA. Aspirin and Plavix was recommended for a minimum of 30 days or 12 months if tolerated.    Feb. 16, 2015:  James [name] was admitted to [facility] since I last saw him for  a platelet transfusion ,  PRBC transfusion.    He has continued to have chemotherapy.   He has not had any further CP.  He has had some DOE ( walking up stairs).  Nothing similar to his MI pain in late Dec. 2014.    He has been measuring his BP on occasion.  He has been going to cardiac rehab up until last week.  He stopped because of fatigue during the exercise.  This was at a nadir of his counts.   Plavix was stopped on Feb. 7 because of his thrombocytopenia.   He has had anemia that has limited his chemotherapy regimin.   The chemo has been altered because of these hematologic.  His most recent Hb is 8.9.    He has tunnel vision at times when he gets up too suddenly  ( I think he is having some orthostasis)   Recent Labs: Aug 21, 2013: HDL Cholesterol 37*; LDL (calc) 71; Pro B Natriuretic peptide (BNP) 79.7  08/03/2013: ALT 25  08/04/2013: Creatinine 1.30; Hemoglobin 10.8*; Potassium 4.5 08/12/2013:  K 4.6, creatinine 1.46, ALT 24, Hgb 11.9 ([facility])  Wt Readings from Last 3 Encounters:  09/26/13 258 lb 12.8 oz (117.391 kg)  08/18/13 253 lb 8.5 oz (115 kg)  08/16/13 252 lb (114.306 kg)     Past Medical History  Diagnosis Date  . Atrial flutter 05/2009    STATUS POST CARDIOVERSION;  s/p RFCA in 2012 (Dr. Caryl [name])  . Depression   . History of immunotherapy 2014    "had many times before and it worked; had a severe reaction January 31, 2013; they coded me, I had sepsis; almost died" (Aug 21, 2013)  . Bladder cancer     'started chemo 07/2013 " (Aug 21, 2013)  . OSA on CPAP   . Pneumonia   . CAD (coronary artery disease)     a. NSTEMI 07/2013:  LHC (08/03/13): mLAD 95% assoc [name]/ thrombus, dLAD 50%, pRCA 80%, mRCA 50%, normal LVF. => PCI:  Rebel (4 x 12 mm) BMS to the mLAD and Rebel (4 x  15 mm) BMS to the pRCA.  . Osteoarthritis   . Dyslipidemia     Lipitor started at time of NSTEMI 07/2013    Current Outpatient Prescriptions  Medication Sig Dispense Refill  . acetaminophen (TYLENOL) 500 MG tablet Take 500 mg by mouth every 6 (six) hours as needed for fever.      Marland Kitchen aspirin 81 MG tablet Take 81 mg by mouth daily.      Marland Kitchen aspirin EC 325 MG tablet Take by mouth.      Marland Kitchen atorvastatin (LIPITOR) 40 MG tablet Take 40 mg by mouth daily.       Marland Kitchen Dexamethasone (DECADRON PO) Take 1 tablet by mouth daily as needed (after chemo treatment).      . famotidine (PEPCID) 20 MG tablet Take 1 tablet (20 mg total) by mouth 2 (two) times daily.  180 tablet  3  . filgrastim (NEUPOGEN) 480 MCG/0.8ML SOLN injection Inject 480 mcg into the skin once. Injections on days 2,3,4 and 8,9,10,11,12.13      .  filgrastim (NEUPOGEN) 480 MCG/0.8ML SOLN injection Inject into the skin. Inject 480 mcg into the skin daily. Inject into skin subcutaneously once daily 2,3 and 4 days after start of chemotherapy, and following day 8 chemotherapy give days 9,10,11,12,13,14,15      . furosemide (LASIX) 40 MG tablet Take one tablet each morning for three days then stop  5 tablet  0  . hydrocortisone cream 0.5 % Apply 1 application topically 2 (two) times daily as needed for itching.      . lidocaine-prilocaine (EMLA) cream Apply topically.      Marland Kitchen loperamide (IMODIUM A-D) 2 MG tablet Take 2 mg by mouth 3 (three) times daily as needed for diarrhea or loose stools.      . metoprolol tartrate (LOPRESSOR) 25 MG tablet Take 12.5 mg by mouth 2 (two) times daily.       . nitroGLYCERIN (NITROSTAT) 0.4 MG SL tablet Place 0.4 mg under the tongue every 5 (five) minutes as needed for chest pain.       Marland Kitchen ondansetron (ZOFRAN) 8 MG tablet as needed.      . potassium chloride (K-DUR,KLOR-CON) 10 MEQ tablet Take one tablet each morning for three days then stop  5 tablet  0  . sertraline (ZOLOFT) 100 MG tablet Take 150 mg by mouth daily.       Marland Kitchen triamcinolone cream (KENALOG) 0.1 % Apply 1 application topically daily as needed (for itching).      . venlafaxine XR (EFFEXOR-XR) 150 MG 24 hr capsule Take 150 mg by mouth daily.        No current facility-administered medications for this visit.    Allergies:   Morphine; Other; Adhesive; Bupropion; Flomax; and Pantoprazole sodium   Social History:  The patient  reports that he has quit smoking. His smoking use included Cigarettes. He has a 22.5 pack-year smoking history. He has never used smokeless tobacco. He reports that he drinks alcohol. He reports that he does not use illicit drugs.   Family History:  The patient's family history includes Coronary artery disease in an other family member; Heart failure in his father; Kidney failure in his mother; Stroke in his father.   ROS:  Please  see the history of present illness.   He has occasional bruising.   All other systems reviewed and negative.   PHYSICAL EXAM: VS:  BP 120/68  Pulse 72  Ht 5\' 8"  (1.727 m)  Wt 258 lb 12.8  oz (117.391 kg)  BMI 39.36 kg/m2 Well nourished, well developed, in no acute distress HEENT: normal Neck: no JVD Cardiac:  normal S1, S2; RRR; no murmur Lungs:  clear to auscultation bilaterally, no wheezing, rhonchi or rales Abd: soft, nontender, no hepatomegaly Ext: no edema; right wrist without hematoma or mass  Skin: warm and dry Neuro:  CNs 2-12 intact, no focal abnormalities noted  EKG:     ASSESSMENT AND PLAN:

## 2013-09-26 NOTE — Patient Instructions (Signed)
Your physician recommends that you return for a FASTING lipid profile: see date  Your physician wants you to follow-up in:6 months  You will receive a reminder letter in the mail two months in advance. If you don't receive a letter, please call our office to schedule the follow-up appointment.

## 2013-09-27 ENCOUNTER — Telehealth (HOSPITAL_COMMUNITY): Payer: Self-pay | Admitting: *Deleted

## 2013-09-28 ENCOUNTER — Other Ambulatory Visit (HOSPITAL_COMMUNITY): Payer: Medicare Other

## 2013-09-28 ENCOUNTER — Encounter (HOSPITAL_COMMUNITY)
Admission: RE | Admit: 2013-09-28 | Discharge: 2013-09-28 | Disposition: A | Discharge status: Home / Self Care | Payer: Medicare Other | Source: Ambulatory Visit | Attending: Cardiovascular Disease | Admitting: Cardiovascular Disease

## 2013-09-28 NOTE — Progress Notes (Signed)
James [name] returned to exercise today and exercised without complaints. Will continue to monitor the patient throughout  the program.

## 2013-09-30 ENCOUNTER — Telehealth (HOSPITAL_COMMUNITY): Payer: Self-pay | Admitting: Internal Medicine

## 2013-09-30 ENCOUNTER — Encounter (HOSPITAL_COMMUNITY): Payer: Medicare Other

## 2013-10-03 ENCOUNTER — Encounter (HOSPITAL_COMMUNITY)
Admission: RE | Admit: 2013-10-03 | Discharge: 2013-10-03 | Disposition: A | Discharge status: Home / Self Care | Payer: Medicare Other | Source: Ambulatory Visit | Attending: Cardiovascular Disease | Admitting: Cardiovascular Disease

## 2013-10-03 NOTE — Progress Notes (Signed)
James [name] 78 y.o. male Nutrition Note Spoke with pt.  Nutrition Plan and Nutrition Survey goals reviewed with pt. Pt is working toward following the Therapeutic Lifestyle Changes diet. Pt wants to lose wt. Pt has been trying to lose wt by watching portion sizes and stopping his ice cream treat every evening. Wt loss tips reviewed. Given pt has metastatic bladder CA, which he is currently receiving chemotherapy, pt encouraged to focus on the quality of his diet rather than on wt loss specifically at this time. Pt reports decreased energy level and some changes in taste. Pt expressed understanding of the information reviewed. Pt aware of nutrition education classes offered.  Nutrition Diagnosis   Food-and nutrition-related knowledge deficit related to lack of exposure to information as related to diagnosis of: ? CVD    Obesity related to excessive energy intake as evidenced by a BMI of 37.4  Nutrition RX/ Estimated Daily Nutrition Needs for: wt loss  1600-2100 Kcal, 45-55 gm fat, 10-14 gm sat fat, 1.6-2.1 gm trans-fat, <1500 mg sodium  Nutrition Intervention   Pt's individual nutrition plan reviewed with pt.   Benefits of adopting Therapeutic Lifestyle Changes discussed when Medficts reviewed.   Pt to attend the Portion Distortion class   Pt to attend the  ? Nutrition I class - met 09/13/13                    ? Nutrition II class    Pt given handouts for: ? Nutrition II class   Continue client-centered nutrition education by RD, as part of interdisciplinary care. Goal(s)   Pt to describe the benefit of including fruits, vegetables, whole grains, and low-fat dairy products in a heart healthy meal plan. Monitor and Evaluate progress toward nutrition goal with team. Nutrition Risk: Change to Moderate Derek Mound, M.Ed, RD, LDN, CDE 10/03/2013 11:06 AM

## 2013-10-05 ENCOUNTER — Encounter (HOSPITAL_COMMUNITY)
Admission: RE | Admit: 2013-10-05 | Discharge: 2013-10-05 | Disposition: A | Discharge status: Home / Self Care | Payer: Medicare Other | Source: Ambulatory Visit | Attending: Cardiovascular Disease | Admitting: Cardiovascular Disease

## 2013-10-05 NOTE — Progress Notes (Signed)
I reviewed James [name]'s quality of life questionnaire this morning. James [name] has low scores in all areas. James [name] denies being depressed he is taking an antidepressant.  James [name] says he has support from his family and his wife's pastor. Patient given ongoing emotional support. James [name] is going to try exercising twice a week instead of three days a week due to fatigue on thursdays after chemotherapy.Will continue to monitor the patient throughout  the program. Will forward patient's quality of life questionnaire  for Dr Acie Fredrickson to review.

## 2013-10-07 ENCOUNTER — Encounter (HOSPITAL_COMMUNITY): Payer: Medicare Other

## 2013-10-10 ENCOUNTER — Telehealth (HOSPITAL_COMMUNITY): Payer: Self-pay | Admitting: *Deleted

## 2013-10-10 ENCOUNTER — Encounter (HOSPITAL_COMMUNITY): Admission: RE | Admit: 2013-10-10 | Payer: Medicare Other | Source: Ambulatory Visit

## 2013-10-11 ENCOUNTER — Other Ambulatory Visit (INDEPENDENT_AMBULATORY_CARE_PROVIDER_SITE_OTHER): Payer: Medicare Other

## 2013-10-11 DIAGNOSIS — E785 Hyperlipidemia, unspecified: Secondary | ICD-10-CM

## 2013-10-11 LAB — LIPID PANEL
Cholesterol: 88 mg/dL (ref 0–200)
HDL: 24.5 mg/dL — ABNORMAL LOW (ref >39.00)
LDL Cholesterol: 47 mg/dL (ref 0–99)
Total CHOL/HDL Ratio: 4
Triglycerides: 82 mg/dL (ref 0.0–149.0)
VLDL: 16.4 mg/dL (ref 0.0–40.0)

## 2013-10-11 LAB — HEPATIC FUNCTION PANEL
ALK PHOS: 116 U/L (ref 39–117)
ALT: 35 U/L (ref 0–53)
AST: 35 U/L (ref 0–37)
Albumin: 3.8 g/dL (ref 3.5–5.2)
BILIRUBIN TOTAL: 0.6 mg/dL (ref 0.3–1.2)
Bilirubin, Direct: 0.1 mg/dL (ref 0.0–0.3)
Total Protein: 7.4 g/dL (ref 6.0–8.3)

## 2013-10-11 LAB — BASIC METABOLIC PANEL
BUN: 14 mg/dL (ref 6–23)
CO2: 26 meq/L (ref 19–32)
CREATININE: 1.3 mg/dL (ref 0.4–1.5)
Calcium: 8.6 mg/dL (ref 8.4–10.5)
Chloride: 102 mEq/L (ref 96–112)
GFR: 55.56 mL/min — ABNORMAL LOW (ref >60.00)
Glucose, Bld: 106 mg/dL — ABNORMAL HIGH (ref 70–99)
Potassium: 4.2 mEq/L (ref 3.5–5.1)
Sodium: 134 mEq/L — ABNORMAL LOW (ref 135–145)

## 2013-10-12 ENCOUNTER — Telehealth (HOSPITAL_COMMUNITY): Payer: Self-pay | Admitting: *Deleted

## 2013-10-12 ENCOUNTER — Encounter (HOSPITAL_COMMUNITY)
Admission: RE | Admit: 2013-10-12 | Discharge: 2013-10-12 | Disposition: A | Discharge status: Home / Self Care | Payer: Medicare Other | Source: Ambulatory Visit | Attending: Cardiovascular Disease | Admitting: Cardiovascular Disease

## 2013-10-12 DIAGNOSIS — Z87891 Personal history of nicotine dependence: Secondary | ICD-10-CM | POA: Insufficient documentation

## 2013-10-12 DIAGNOSIS — I252 Old myocardial infarction: Secondary | ICD-10-CM | POA: Insufficient documentation

## 2013-10-12 DIAGNOSIS — I2510 Atherosclerotic heart disease of native coronary artery without angina pectoris: Secondary | ICD-10-CM | POA: Insufficient documentation

## 2013-10-12 DIAGNOSIS — Z79899 Other long term (current) drug therapy: Secondary | ICD-10-CM | POA: Insufficient documentation

## 2013-10-12 DIAGNOSIS — Z5189 Encounter for other specified aftercare: Secondary | ICD-10-CM | POA: Insufficient documentation

## 2013-10-14 ENCOUNTER — Encounter (HOSPITAL_COMMUNITY)
Admission: RE | Admit: 2013-10-14 | Discharge: 2013-10-14 | Disposition: A | Discharge status: Home / Self Care | Payer: Medicare Other | Source: Ambulatory Visit | Attending: Cardiovascular Disease | Admitting: Cardiovascular Disease

## 2013-10-17 ENCOUNTER — Encounter (HOSPITAL_COMMUNITY)
Admission: RE | Admit: 2013-10-17 | Discharge: 2013-10-17 | Disposition: A | Discharge status: Home / Self Care | Payer: Medicare Other | Source: Ambulatory Visit | Attending: Cardiovascular Disease | Admitting: Cardiovascular Disease

## 2013-10-19 ENCOUNTER — Telehealth: Payer: Self-pay | Admitting: Physician Assistant

## 2013-10-19 ENCOUNTER — Encounter (HOSPITAL_COMMUNITY)
Admission: RE | Admit: 2013-10-19 | Discharge: 2013-10-19 | Disposition: A | Discharge status: Home / Self Care | Payer: Medicare Other | Source: Ambulatory Visit | Attending: Cardiovascular Disease | Admitting: Cardiovascular Disease

## 2013-10-19 NOTE — Progress Notes (Signed)
Shayden reported feeling the room getting dark after getting up to walk out of the room from education class.  Patient was noticed to have a twitching in his arm.  Sitting blood pressure 114/58. Heart rate 74.  Standing blood pressure 120/60 heart rate 78. Patient placed on Zoll. Telemetry rhythm sinus 65. Dayna Dunn PAC.  Neale says this has been happened to him a few times in February.

## 2013-10-19 NOTE — Telephone Encounter (Signed)
Pt of Dr. Elmarie Shiley s/p PCI in 07/2013 and bladder cancer undergoing chemo, anemia s/p transfusion 2 weeks ago, h/o orthostasis-type sx. Went to rehab session today. He felt the room getting dark after getting up to walk out of the room from education class. He leaned against a wall and had some twitching in R arm but never lost consciousness. Sitting BP 114/58, standing 120/60, HR 74->78. Zoll sinus rhythm 65, no other complaints reported. D/w Dr. Acie Fredrickson. Given h/o orthostasis would advise adequate hydration, compression stockings. He is to seek medical attention if episode recurs. Verdis Frederickson has arranged for the pt's wife to drive him home. Dayna Dunn PA-C

## 2013-10-19 NOTE — Progress Notes (Signed)
Recheck blood pressure 132/60 standing.  Patient given Gatorade. Patient's wife James [name] called and notified of events.  Patient's wife to come pick up patient. No further complaints voiced. James [name] is scheduled to have a chemotherapy treatment at [facility] tomorrow. Patient instructed per Melina Copa PAC to make sure he id drinking enough fluids and to consider wearing compression stockings. Patient instructed to go the Ed he has any more episodes. Patient states understanding.

## 2013-10-20 ENCOUNTER — Telehealth: Payer: Self-pay | Admitting: Cardiovascular Disease

## 2013-10-20 NOTE — Telephone Encounter (Signed)
Patient at [facility] seeing Dr Marcello Moores today. He is recommending discontinuing or decreasing Metoprolol dose (takes Metoprolol 25 mg 1/2 tablet twice a day). Patient continues to have the spells of feeling like he is going to pass out and will have tremor at times with this. Patient had round of chemo today. Patient is having episode daily.  CBC today  WBC 3.4  RBC 2.72  HGB 8.9  HCT 26.7  MCV 98.1   PLATELET 118 Wife wanted you to be aware, other results viewable (care everywhere)  Will forward to [place] and Dr Acie Fredrickson for review

## 2013-10-20 NOTE — Telephone Encounter (Signed)
New problem   Pt is having pre syncope and need to speak to nurse concerning this matter for possible medication change recommended by oncologist. Please call pt

## 2013-10-21 ENCOUNTER — Encounter (HOSPITAL_COMMUNITY): Payer: Medicare Other

## 2013-10-21 NOTE — Telephone Encounter (Signed)
Pt was continuing daily bid dose// told him to skip tonight and tomorrow night and stop. Pt agreed to plan and will call when he is advised to restart.

## 2013-10-21 NOTE — Telephone Encounter (Signed)
Ok to hold metoprolol for now Will have to reassess in the office before starting

## 2013-10-24 ENCOUNTER — Telehealth: Payer: Self-pay | Admitting: Cardiovascular Disease

## 2013-10-24 ENCOUNTER — Telehealth (HOSPITAL_COMMUNITY): Payer: Self-pay | Admitting: *Deleted

## 2013-10-24 ENCOUNTER — Encounter (HOSPITAL_COMMUNITY)
Admission: RE | Admit: 2013-10-24 | Discharge: 2013-10-24 | Disposition: A | Discharge status: Home / Self Care | Payer: Medicare Other | Source: Ambulatory Visit | Attending: Cardiovascular Disease | Admitting: Cardiovascular Disease

## 2013-10-24 ENCOUNTER — Telehealth: Payer: Self-pay | Admitting: Adult Health

## 2013-10-24 ENCOUNTER — Ambulatory Visit (HOSPITAL_COMMUNITY)
Admission: RE | Admit: 2013-10-24 | Discharge: 2013-10-24 | Disposition: A | Discharge status: Home / Self Care | Payer: Medicare Other | Source: Ambulatory Visit | Attending: Internal Medicine | Admitting: Internal Medicine

## 2013-10-24 DIAGNOSIS — I440 Atrioventricular block, first degree: Secondary | ICD-10-CM | POA: Insufficient documentation

## 2013-10-24 DIAGNOSIS — R079 Chest pain, unspecified: Secondary | ICD-10-CM | POA: Insufficient documentation

## 2013-10-24 NOTE — Telephone Encounter (Signed)
App was made as requested.

## 2013-10-24 NOTE — Telephone Encounter (Signed)
Chart was reviewed/ pt had to stop clopidogrel due to cytopenia. Pt was to be on plavix 1 month - 12 months if it was tolerated. Med is not on his med list. Pt remains on ASA.

## 2013-10-24 NOTE — Progress Notes (Signed)
James [name] reported having chest discomfort on the treadmill today at cardiac rehab. James [name] rated the discomfort as a 3 on a 1-10 scale.  Blood pressure 170/80. Exercise stopped.  Repeat blood pressure 150/80. Telemetry rhythm Sinus.  Blood pressure 130/60. James [name] PAC called and notified. !2 lead ECG  Obtained showed Sinus Rhythm first degree heart block.  Dr Acie Fredrickson called and talked with James [name] about his chest discomfort. Dr Lowella Grip James [name] to go home. Dr Elmarie Shiley office will call James [name] for a follow up appointment on Wednesday. James [name] will not return to exercise until okayed by Dr Acie Fredrickson. James [name] remained pain free upon exit from cardiac rehab. Exit BP 124/66. Heart rate 83.

## 2013-10-24 NOTE — Telephone Encounter (Signed)
Received fax refill request  Rx #  Medication:  Clopidogrel 75 mg tablet Qty 90 Sig:   Physician:  Purcell Nails  ** Please see paper in refill bin / tgs**

## 2013-10-24 NOTE — Telephone Encounter (Signed)
We were called by James [name] in cardiac rehab .  James [name] is not doing as well.  James [name] had some orthostasis last week and we stopped his metoprolol.  He has had some worsening angina over the weekend and this am he had CP while walking on the treadmill at cardiac rehab.  The pain has resolved by now.  He is feeling fine.  ECG has been ordered.   I will see him at 7:45 on Wednesday March 18 as a work in visit.  He was told to arrive at 7:30 on Wednesday    Thayer Headings, Brooke Bonito., MD, [facility] 10/24/2013, 11:16 AM Office - [phone] Pager [phone]

## 2013-10-25 ENCOUNTER — Other Ambulatory Visit: Payer: Self-pay

## 2013-10-25 ENCOUNTER — Telehealth: Payer: Self-pay | Admitting: Adult Health

## 2013-10-25 MED ORDER — ATORVASTATIN CALCIUM 40 MG PO TABS: 40.0000 mg | ORAL_TABLET | Freq: Every day | ORAL | Status: DC

## 2013-10-25 NOTE — Telephone Encounter (Signed)
Please see refill bin / tgs  °

## 2013-10-26 ENCOUNTER — Encounter (HOSPITAL_COMMUNITY): Payer: Medicare Other

## 2013-10-26 ENCOUNTER — Encounter: Payer: Self-pay | Admitting: *Deleted

## 2013-10-26 ENCOUNTER — Ambulatory Visit (INDEPENDENT_AMBULATORY_CARE_PROVIDER_SITE_OTHER): Payer: Medicare Other | Admitting: Cardiovascular Disease

## 2013-10-26 ENCOUNTER — Encounter: Payer: Self-pay | Admitting: Cardiovascular Disease

## 2013-10-26 ENCOUNTER — Encounter (INDEPENDENT_AMBULATORY_CARE_PROVIDER_SITE_OTHER): Payer: Medicare Other

## 2013-10-26 VITALS — BP 144/68 | HR 86 | Ht 68.0 in | Wt 254.8 lb

## 2013-10-26 DIAGNOSIS — R079 Chest pain, unspecified: Secondary | ICD-10-CM

## 2013-10-26 DIAGNOSIS — R55 Syncope and collapse: Secondary | ICD-10-CM

## 2013-10-26 DIAGNOSIS — I2510 Atherosclerotic heart disease of native coronary artery without angina pectoris: Secondary | ICD-10-CM

## 2013-10-26 MED ORDER — CARVEDILOL 6.25 MG PO TABS: 6.2500 mg | ORAL_TABLET | Freq: Two times a day (BID) | ORAL | Status: DC

## 2013-10-26 NOTE — Assessment & Plan Note (Signed)
James [name] presents with a relatively difficult situation. He has been having episodes of presyncope which prompted the discontinuation of his metoprolol. Since discontinuing the metoprolol, he's had episodes of angina with walking or with climbing stairs. These episodes usually only last for a minute or so and resolves when he stops to rest.  Reviewed the cardiac catheterization and PCI procedure from December. During that PCI of the left anterior descending artery, the diagonal branch was pinched and is likely ischemic. I suspect that this site of his angina.  This side branch is relatively small and really would not be a critical narrowing. I goal would be to reduce his symptoms.  We will get a Lexiscan Myoview study.  If he is found to have a small anterolateral defect , then I think it is probably due to the small diagonal subtotal occlusion and this situation could be treated medically. On the other hand, if his whole LAD distribution is ischemic, he'll need repeat cardiac catheterization.  We'll need to find a way to increase his beta blocker. We'll try carvedilol 6.25 mg twice a day.  I'm concerned that he may be having some sinus pauses and response to the Toprol. We will place a 30 day event monitor on him. He may need a pacemaker placement as backup as we gradually increase his beta blocker.    I will see him again in 2-3 months.

## 2013-10-26 NOTE — Progress Notes (Signed)
[place], [place], [place]  [address] Phone: [phone] Fax:  [phone]  Date:  10/26/2013   ID:  KYEIR [name], DOB [birth year], MRN [id number]  PCP:  Horatio Pel, MD  Cardiologist:  Dr. Liam Rogers   Electrophysiologist:  Dr. Virl Axe     Problem List: 1. Coronary artery disease-status post stenting of his mid LAD and proximal RCA ( 08/03/14)  2. History of atrial flutter-status post radiofrequency ablation in 2012 3. Metastatic bladder cancer 4. Obstructive sleep apnea 5. Dyslipidemia  History of Present Illness: James [name] is a 78 y.o. male with a hx of AFlutter s/p RFCA in 2012 and metastatic bladder CA followed by [facility].  Echocardiogram (01/2011): Mild LVH, EF 55-65%, trivial AI, mild MR, mild to moderate LAE, mild RAE, PASP 31, trivial effusion.    He was admitted 08/02/13-08/04/13 with a NSTEMI.  Chest CTA was negative for pulmonary embolism.  LHC (08/03/13): Mid LAD 95% associated with thrombus, distal LAD 50%, proximal RCA 80% mid RCA 50%, normal LV function. PCI:  Rebel (4 x 12 mm) BMS to the mid LAD and Rebel (4 x 15 mm) BMS to the proximal RCA. Aspirin and Plavix was recommended for a minimum of 30 days or 12 months if tolerated.    Feb. 16, 2015:  [name] was admitted to [facility] since I last saw him for  a platelet transfusion ,  PRBC transfusion.    He has continued to have chemotherapy.   He has not had any further CP.  He has had some DOE ( walking up stairs).  Nothing similar to his MI pain in late Dec. 2014.    He has been measuring his BP on occasion.  He has been going to cardiac rehab up until last week.  He stopped because of fatigue during the exercise.  This was at a nadir of his counts.   Plavix was stopped on Feb. 7 because of his thrombocytopenia.   He has had anemia that has limited his chemotherapy regimin.   The chemo has been altered because of these hematologic.  His most recent Hb is 8.9.   He has tunnel vision at times when he gets up too suddenly  ( I think he is having some orthostasis)   October 26, 2013: [name] is seen as a work in visit. He had been having symptoms of orthostatic hypotension.  These episodes last for several seconds - are associated with 4-6 seconds of narrow vision.  The episodes resolved spontaneously.    The metoprolol was stopped.   Since that time, he's had increasing angina-especially at cardiac rehabilitation.  i received a call from [place].  He went home and has done well.   The ECG at cardiac rehab was OK   Recent Labs: 08/02/2013: Pro B Natriuretic peptide (BNP) 79.7  08/04/2013: Hemoglobin 10.8*  10/11/2013: ALT 35; Creatinine 1.3; HDL Cholesterol 24.50*; LDL (calc) 47; Potassium 4.2 08/12/2013:  K 4.6, creatinine 1.46, ALT 24, Hgb 11.9 ([facility])  Wt Readings from Last 3 Encounters:  10/26/13 254 lb 12.8 oz (115.577 kg)  09/26/13 258 lb 12.8 oz (117.391 kg)  08/18/13 253 lb 8.5 oz (115 kg)     Past Medical History  Diagnosis Date  . Atrial flutter 05/2009    STATUS POST CARDIOVERSION;  s/p RFCA in 2012 (Dr. Caryl Comes)  . Depression   . History of immunotherapy 2014    "had many times  before and it worked; had a severe reaction 2013/01/23; they coded me, I had sepsis; almost died" (08/13/2013)  . Bladder cancer     'started chemo 07/2013 " (2013/08/13)  . OSA on CPAP   . Pneumonia   . CAD (coronary artery disease)     a. NSTEMI 07/2013:  LHC (08/03/13): mLAD 95% assoc w/ thrombus, dLAD 50%, pRCA 80%, mRCA 50%, normal LVF. => PCI:  Rebel (4 x 12 mm) BMS to the mLAD and Rebel (4 x 15 mm) BMS to the pRCA.  . Osteoarthritis   . Dyslipidemia     Lipitor started at time of NSTEMI 07/2013    Current Outpatient Prescriptions  Medication Sig Dispense Refill  . acetaminophen (TYLENOL) 500 MG tablet Take 500 mg by mouth every 6 (six) hours as needed for fever.      Marland Kitchen aspirin 81 MG tablet Take 81 mg by mouth daily.      Marland Kitchen  atorvastatin (LIPITOR) 40 MG tablet Take 1 tablet (40 mg total) by mouth daily.  90 tablet  1  . Dexamethasone (DECADRON PO) Take 1 tablet by mouth daily as needed (after chemo treatment).      . famotidine (PEPCID) 20 MG tablet Take 1 tablet (20 mg total) by mouth 2 (two) times daily.  180 tablet  3  . filgrastim (NEUPOGEN) 480 MCG/0.8ML SOLN injection Inject 480 mcg into the skin once. Injections on days 2,3,4 and 8,9,10,11,12.13      . filgrastim (NEUPOGEN) 480 MCG/0.8ML SOLN injection Inject into the skin. Inject 480 mcg into the skin daily. Inject into skin subcutaneously once daily 2,3 and 4 days after start of chemotherapy, and following day 8 chemotherapy give days 9,10,11,12,13,14,15      . hydrocortisone cream 0.5 % Apply 1 application topically 2 (two) times daily as needed for itching.      . lidocaine-prilocaine (EMLA) cream Apply topically.      Marland Kitchen loperamide (IMODIUM A-D) 2 MG tablet Take 2 mg by mouth 3 (three) times daily as needed for diarrhea or loose stools.      . nitroGLYCERIN (NITROSTAT) 0.4 MG SL tablet Place 0.4 mg under the tongue every 5 (five) minutes as needed for chest pain.       Marland Kitchen ondansetron (ZOFRAN) 8 MG tablet as needed.      . sertraline (ZOLOFT) 100 MG tablet Take 150 mg by mouth daily.       Marland Kitchen triamcinolone cream (KENALOG) 0.1 % Apply 1 application topically daily as needed (for itching).      . venlafaxine XR (EFFEXOR-XR) 150 MG 24 hr capsule Take 150 mg by mouth daily.        No current facility-administered medications for this visit.    Allergies:   Morphine; Other; Adhesive; Bupropion; Flomax; and Pantoprazole sodium   Social History:  The patient  reports that he has quit smoking. His smoking use included Cigarettes. He has a 22.5 pack-year smoking history. He has never used smokeless tobacco. He reports that he drinks alcohol. He reports that he does not use illicit drugs.   Family History:  The patient's family history includes Coronary artery disease  in an other family member; Heart failure in his father; Kidney failure in his mother; Stroke in his father.   ROS:  Please see the history of present illness.   He has occasional bruising.   All other systems reviewed and negative.   PHYSICAL EXAM: VS:  BP 144/68  Pulse 86  Ht 5'  8" (1.727 m)  Wt 254 lb 12.8 oz (115.577 kg)  BMI 38.75 kg/m2 Well nourished, well developed, in no acute distress HEENT: normal Neck: no JVD Cardiac:  normal S1, S2; RRR; no murmur Lungs:  clear to auscultation bilaterally, no wheezing, rhonchi or rales Abd: soft, nontender, no hepatomegaly Ext: no edema; right wrist without hematoma or mass  Skin: warm and dry Neuro:  CNs 2-12 intact, no focal abnormalities noted  EKG:     ASSESSMENT AND PLAN:

## 2013-10-26 NOTE — Patient Instructions (Signed)
Your physician has recommended that you wear an event monitor//soon please//TODAY IF AVAILABLE . Event monitors are medical devices that record the heart's electrical activity. Doctors most often [place] these monitors to diagnose arrhythmias. Arrhythmias are problems with the speed or rhythm of the heartbeat. The monitor is a small, portable device. You can wear one while you do your normal daily activities. This is usually used to diagnose what is causing palpitations/syncope (passing out).   Your physician has requested that you have a lexiscan myoview. Within 1 week please  Please follow instruction sheet, as given.  Your physician recommends that you schedule a follow-up appointment in: [place] has recommended you make the following change in your medication:  START COREG/CARVEDILOL 6.25 MG TWICE DAILY 12 HOURS APART.

## 2013-10-26 NOTE — Progress Notes (Signed)
Patient ID: James [name], male   DOB: 01/16/1936, 78 y.o.   MRN: [id number] E-Cardio [place] 30 day cardiac event monitor applied to patient.

## 2013-10-28 ENCOUNTER — Encounter (HOSPITAL_COMMUNITY): Payer: Medicare Other

## 2013-10-31 ENCOUNTER — Encounter (HOSPITAL_COMMUNITY): Payer: Medicare Other

## 2013-11-02 ENCOUNTER — Encounter (HOSPITAL_COMMUNITY): Payer: Medicare Other

## 2013-11-02 ENCOUNTER — Ambulatory Visit (HOSPITAL_COMMUNITY): Payer: Medicare Other | Attending: Cardiology | Admitting: Radiology

## 2013-11-02 VITALS — BP 103/55 | Ht 68.0 in | Wt 259.0 lb

## 2013-11-02 DIAGNOSIS — I2510 Atherosclerotic heart disease of native coronary artery without angina pectoris: Secondary | ICD-10-CM

## 2013-11-02 DIAGNOSIS — R079 Chest pain, unspecified: Secondary | ICD-10-CM | POA: Insufficient documentation

## 2013-11-02 DIAGNOSIS — R0609 Other forms of dyspnea: Secondary | ICD-10-CM | POA: Insufficient documentation

## 2013-11-02 DIAGNOSIS — R0989 Other specified symptoms and signs involving the circulatory and respiratory systems: Secondary | ICD-10-CM | POA: Insufficient documentation

## 2013-11-02 DIAGNOSIS — R42 Dizziness and giddiness: Secondary | ICD-10-CM | POA: Insufficient documentation

## 2013-11-02 DIAGNOSIS — R5383 Other fatigue: Secondary | ICD-10-CM

## 2013-11-02 DIAGNOSIS — R55 Syncope and collapse: Secondary | ICD-10-CM

## 2013-11-02 DIAGNOSIS — R5381 Other malaise: Secondary | ICD-10-CM | POA: Insufficient documentation

## 2013-11-02 MED ORDER — REGADENOSON 0.4 MG/5ML IV SOLN
0.4000 mg | Freq: Once | INTRAVENOUS | Status: AC
  Administered 2013-11-02: 0.4 mg via INTRAVENOUS

## 2013-11-02 MED ORDER — TECHNETIUM TC 99M SESTAMIBI GENERIC - CARDIOLITE
10.0000 | Freq: Once | INTRAVENOUS | Status: AC | PRN
  Administered 2013-11-02: 10 via INTRAVENOUS

## 2013-11-02 MED ORDER — TECHNETIUM TC 99M SESTAMIBI GENERIC - CARDIOLITE
30.0000 | Freq: Once | INTRAVENOUS | Status: AC | PRN
  Administered 2013-11-02: 30 via INTRAVENOUS

## 2013-11-02 NOTE — Progress Notes (Signed)
[facility] [address] [place], [place] [address] [phone]    Cardiology Nuclear Med Study  James [name] is a 78 y.o. male     MRN : [id number]     DOB: [birth year]  Procedure Date: 11/02/2013  Nuclear Med Background Indication for Stress Test:  Evaluation for Ischemia and Stent Patency History:  CAD,MI-CATH-Stent-LAD-RCA, 2015 EF: 60-65%, AFLUTTER S/P Ablation 2012  Cardiac Risk Factors: Family History - CAD, History of Smoking and Lipids  Symptoms:  Dizziness, DOE and Fatigue   Nuclear Pre-Procedure Caffeine/Decaff Intake:  7:30pm NPO After: 11:30pm   Lungs:  clear O2 Sat: 98% on room air. IV 0.9% NS with Angio Cath:  22g  IV Site: R Wrist,tolerated well IV Started by:  Irven Baltimore, RN  Chest Size (in):  48 Cup Size: n/a  Height: 5\' 8"  (1.727 m)  Weight:  259 lb (117.482 kg)  BMI:  Body mass index is 39.39 kg/(m^2). Tech Comments:  Patient took Coreg this am.The patient complained of lightheadedness after walking. To sitting position with BP 110/60, HR 66, O2 sat 97%RA.Skin warm and dry. Symptoms resolved after sitting.10 minutes later,Standing BP 100/60, HR 64. Irven Baltimore, RN.    Nuclear Med Study 1 or 2 day study: 1 day  Stress Test Type:  Lexiscan  Reading MD: N/A  Order Authorizing Provider:  Mertie Moores, MD  Resting Radionuclide: Technetium 36m Sestamibi  Resting Radionuclide Dose: 11.0 mCi   Stress Radionuclide:  Technetium 38m Sestamibi  Stress Radionuclide Dose: 33.0 mCi           Stress Protocol Rest HR: 58 Stress HR: 67  Rest BP: 103/55 Stress BP: 113/71  Exercise Time (min): n/a METS: n/a   Predicted Max HR: 144 bpm % Max HR: 46.53 bpm Rate Pressure Product: 7571   Dose of Adenosine (mg):  n/a Dose of Lexiscan: 0.4 mg  Dose of Atropine (mg): n/a Dose of Dobutamine: n/a mcg/kg/min (at max HR)  Stress Test Technologist: Perrin Maltese, EMT-P  Nuclear Technologist:  Charlton Amor, CNMT     Rest Procedure:   Myocardial perfusion imaging was performed at rest 45 minutes following the intravenous administration of Technetium 60m Sestamibi. Rest ECG: NSR - Normal EKG  Stress Procedure:  The patient received IV Lexiscan 0.4 mg over 15-seconds.  Technetium 42m Sestamibi injected at 30-seconds. This patient had chest and neck pressure with the Lexiscan injection. Quantitative spect images were obtained after a 45 minute delay. Stress ECG: No significant change from baseline ECG  QPS Raw Data Images:  Mild diaphragmatic attenuation.  Normal left ventricular size. Stress Images:  There is decreased uptake in the apex. Rest Images:  Decreased uptake at apex and inferior wall Subtraction (SDS):  No evidence of ischemia. Transient Ischemic Dilatation (Normal <1.22):  1.29 Lung/Heart Ratio (Normal <0.45):  0.36  Quantitative Gated Spect Images QGS EDV:  136 ml QGS ESV:  52 ml  Impression Exercise Capacity:  Lexiscan with no exercise. BP Response:  Normal blood pressure response. Clinical Symptoms:  Chest and neck pressure ECG Impression:  No significant ECG changes with Lexiscan. Comparison with Prior Nuclear Study: No previous nuclear study performed  Overall Impression:  Low risk stress nuclear study with no reversible ischemia.  LV Ejection Fraction: 62%.  LV Wall Motion:  Normal Wall Motion  Pixie Casino, MD, [facility] Board Certified in Nuclear Cardiology Attending Cardiologist [place]

## 2013-11-03 ENCOUNTER — Telehealth: Payer: Self-pay | Admitting: Cardiovascular Disease

## 2013-11-03 NOTE — Telephone Encounter (Signed)
I will have the stress test results to Dr Nunzio Cobbs FAX @[phone]. Results were reviewed with pt.

## 2013-11-03 NOTE — Telephone Encounter (Signed)
New message    Copy of nuclear stress test forward to [facility] . Dr. Marcello Moores phone # [phone]

## 2013-11-03 NOTE — Telephone Encounter (Signed)
Patient would like results of testing that was done yesterday, please call and advise.

## 2013-11-04 ENCOUNTER — Encounter (HOSPITAL_COMMUNITY): Payer: Medicare Other

## 2013-11-07 ENCOUNTER — Encounter (HOSPITAL_COMMUNITY): Payer: Medicare Other

## 2013-11-08 ENCOUNTER — Telehealth (HOSPITAL_COMMUNITY): Payer: Self-pay | Admitting: *Deleted

## 2013-11-08 ENCOUNTER — Telehealth: Payer: Self-pay | Admitting: *Deleted

## 2013-11-08 NOTE — Telephone Encounter (Signed)
I called shelly/ monitor room, asked for update on monitor results.  Pt is wondering when he will be able to return to cardiac rehab/ pt was told I will call him tomorrow with an update.

## 2013-11-08 NOTE — Telephone Encounter (Signed)
Message copied by Jonathon Jordan on Tue Nov 08, 2013  2:44 PM ------      Message from: Golden Hurter D      Created: Mon Nov 07, 2013  3:15 PM       Cline Crock I spoke to patient's wife 11/07/13 told her results of myoview.She wanted you to call her 11/08/13.She wants to know if you know any results of event monitor.   Malachy Mood ------

## 2013-11-08 NOTE — Telephone Encounter (Signed)
Cardiac monitor report reviewed/ SR @ 76-78 bpm, occ PAC 1st degree avb. Dr Acie Fredrickson will review tomorrow.

## 2013-11-09 ENCOUNTER — Encounter (HOSPITAL_COMMUNITY): Payer: Medicare Other

## 2013-11-11 ENCOUNTER — Encounter (HOSPITAL_COMMUNITY): Payer: Medicare Other

## 2013-11-14 ENCOUNTER — Encounter (HOSPITAL_COMMUNITY): Payer: Medicare Other

## 2013-11-16 ENCOUNTER — Encounter (HOSPITAL_COMMUNITY): Payer: Medicare Other

## 2013-11-18 ENCOUNTER — Encounter (HOSPITAL_COMMUNITY): Payer: Medicare Other

## 2013-11-21 ENCOUNTER — Encounter (HOSPITAL_COMMUNITY): Payer: Medicare Other

## 2013-11-23 ENCOUNTER — Encounter (HOSPITAL_COMMUNITY): Payer: Medicare Other

## 2013-11-25 ENCOUNTER — Encounter (HOSPITAL_COMMUNITY): Payer: Medicare Other

## 2013-11-28 ENCOUNTER — Encounter (HOSPITAL_COMMUNITY): Admission: RE | Admit: 2013-11-28 | Payer: Medicare Other | Source: Ambulatory Visit

## 2013-11-29 ENCOUNTER — Telehealth: Payer: Self-pay | Admitting: Nurse Practitioner

## 2013-11-29 NOTE — Telephone Encounter (Signed)
Spoke with patient's wife, Winifred, who is with patient at the cancer center and reviewed monitor results:  Per Dr. Acie Fredrickson, NSR.  Wife verbalized understanding and states patient states he is feeling better on Coreg since stopping the Toprol; states he continues to have occasional episodes of rising out of chair, walks 10 feet, and then has to stop and take a deep breath to keep from fainting.  I discussed the potential for orthostatic hypotension with wife and we discussed hydration.  Wife asked if patient could increase salt in diet some and I advised her to have patient eat a salty snack when he experiences another episode and to note whether or not this helps him.  Wife states patient is currently undergoing chemotherapy and receives a growth hormone injection and will complete soon; states no other medicine changes recently.  I advised wife to help patient monitor his water intake and to ensure he is getting at least 64 oz of water daily.  Wife verbalized understanding and agreement and will call back if patient's symptoms worsen or with additional questions or concerns.

## 2013-11-30 ENCOUNTER — Other Ambulatory Visit: Payer: Self-pay | Admitting: Dermatology

## 2013-11-30 ENCOUNTER — Encounter (HOSPITAL_COMMUNITY): Payer: Medicare Other

## 2013-12-01 ENCOUNTER — Telehealth (HOSPITAL_COMMUNITY): Payer: Self-pay | Admitting: *Deleted

## 2013-12-02 ENCOUNTER — Encounter (HOSPITAL_COMMUNITY): Payer: Medicare Other

## 2013-12-05 ENCOUNTER — Encounter (HOSPITAL_COMMUNITY): Payer: Medicare Other

## 2013-12-07 ENCOUNTER — Encounter (HOSPITAL_COMMUNITY): Payer: Medicare Other

## 2013-12-09 ENCOUNTER — Encounter (HOSPITAL_COMMUNITY): Payer: Medicare Other

## 2013-12-12 ENCOUNTER — Encounter (HOSPITAL_COMMUNITY): Payer: Medicare Other

## 2013-12-14 ENCOUNTER — Encounter (HOSPITAL_COMMUNITY): Payer: Medicare Other

## 2013-12-15 ENCOUNTER — Telehealth (HOSPITAL_COMMUNITY): Payer: Self-pay | Admitting: *Deleted

## 2013-12-16 ENCOUNTER — Encounter (HOSPITAL_COMMUNITY): Payer: Medicare Other

## 2013-12-19 ENCOUNTER — Encounter (HOSPITAL_COMMUNITY): Payer: Medicare Other

## 2013-12-20 ENCOUNTER — Telehealth (HOSPITAL_COMMUNITY): Payer: Self-pay | Admitting: *Deleted

## 2013-12-21 ENCOUNTER — Encounter (HOSPITAL_COMMUNITY): Payer: Medicare Other

## 2013-12-23 ENCOUNTER — Encounter (HOSPITAL_COMMUNITY): Payer: Medicare Other

## 2013-12-26 ENCOUNTER — Encounter: Payer: Self-pay | Admitting: Cardiovascular Disease

## 2013-12-26 ENCOUNTER — Ambulatory Visit (INDEPENDENT_AMBULATORY_CARE_PROVIDER_SITE_OTHER): Payer: Medicare Other | Admitting: Cardiovascular Disease

## 2013-12-26 VITALS — BP 116/70 | HR 60 | Ht 68.0 in | Wt 254.8 lb

## 2013-12-26 DIAGNOSIS — I2510 Atherosclerotic heart disease of native coronary artery without angina pectoris: Secondary | ICD-10-CM

## 2013-12-26 NOTE — Assessment & Plan Note (Signed)
James [name] is doing fairly well. He's not having any significant episodes of angina. He's not participating in rehabilitation because he missed too many  sessions. He would like to start exercising at the [facility].

## 2013-12-26 NOTE — Patient Instructions (Signed)
Your physician recommends that you continue on your current medications as directed. Please refer to the Current Medication list given to you today.  Your physician wants you to follow-up in: 6 months with Dr. Acie Fredrickson.  You will receive a reminder letter in the mail two months in advance. If you don't receive a letter, please call our office to schedule the follow-up appointment.  Your physician recommends that you return for lab work in: 6 months on the same day or before you see Dr. Acie Fredrickson.   You will need to fast for this appointment - nothing to eat or drink after midnight the night before except water

## 2013-12-26 NOTE — Progress Notes (Signed)
[place], [place], [place]  [address] Phone: [phone] Fax:  [phone]  Date:  12/26/2013   ID:  James [name], DOB July 01, 1936, MRN [id number]  PCP:  Horatio Pel, MD  Cardiologist:  Dr. Liam Rogers   Electrophysiologist:  Dr. Virl Axe     Problem List: 1. Coronary artery disease-status post stenting of his mid LAD and proximal RCA ( 08/03/14)  2. History of atrial flutter-status post radiofrequency ablation in 2012 3. Metastatic bladder cancer 4. Obstructive sleep apnea 5. Dyslipidemia  History of Present Illness: James [name] is a 78 y.o. male with a hx of AFlutter s/p RFCA in 2012 and metastatic bladder CA followed by [facility].  Echocardiogram (01/2011): Mild LVH, EF 55-65%, trivial AI, mild MR, mild to moderate LAE, mild RAE, PASP 31, trivial effusion.    He was admitted 08/02/13-08/04/13 with a NSTEMI.  Chest CTA was negative for pulmonary embolism.  LHC (08/03/13): Mid LAD 95% associated with thrombus, distal LAD 50%, proximal RCA 80% mid RCA 50%, normal LV function. PCI:  Rebel (4 x 12 mm) BMS to the mid LAD and Rebel (4 x 15 mm) BMS to the proximal RCA. Aspirin and Plavix was recommended for a minimum of 30 days or 12 months if tolerated.    Feb. 16, 2015:  Heman was admitted to [facility] since I last saw him for  a platelet transfusion ,  PRBC transfusion.    He has continued to have chemotherapy.   He has not had any further CP.  He has had some DOE ( walking up stairs).  Nothing similar to his MI pain in late Dec. 2014.    He has been measuring his BP on occasion.  He has been going to cardiac rehab up until last week.  He stopped because of fatigue during the exercise.  This was at a nadir of his counts.   Plavix was stopped on Feb. 7 because of his thrombocytopenia.   He has had anemia that has limited his chemotherapy regimin.   The chemo has been altered because of these hematologic.  His most recent Hb is 8.9.   He has tunnel vision at times when he gets up too suddenly  ( I think he is having some orthostasis)   October 26, 2013: [name] is seen as a work in visit. He had been having symptoms of orthostatic hypotension.  These episodes last for several seconds - are associated with 4-6 seconds of narrow vision.  The episodes resolved spontaneously.    The metoprolol was stopped.   Since that time, he's had increasing angina-especially at cardiac rehabilitation.  i received a call from [place].  He went home and has done well.   The ECG at cardiac rehab was [facility]  Dec 26, 2013:  [name] has been able to do all of his normal activities - [place] [facility]   and walked his dogs without problems.    He thinks that sometimes, the Cp is related to swallowing.    He is having fewer episodes of orthostatic hypotension  His lymph nodes are not shrinking .  He will restart chemo again.    Recent Labs: 08/02/2013: Pro B Natriuretic peptide (BNP) 79.7  08/04/2013: Hemoglobin 10.8*  10/11/2013: ALT 35; Creatinine 1.3; HDL Cholesterol by NMR 24.50*; LDL (calc) 47; Potassium 4.2 08/12/2013:  K 4.6, creatinine 1.46, ALT 24, Hgb 11.9 ([facility])  [place]  Readings from Last 3 Encounters:  12/26/13 254 lb 12.8 oz (115.577 kg)  11/02/13 259 lb (117.482 kg)  10/26/13 254 lb 12.8 oz (115.577 kg)     Past Medical History  Diagnosis Date  . Atrial flutter 05/2009    STATUS POST CARDIOVERSION;  s/p RFCA in 2012 (Dr. Caryl Comes)  . Depression   . History of immunotherapy 2014    "had many times before and it worked; had a severe reaction 01-27-2013; they coded me, I had sepsis; almost died" (18-Aug-2013)  . Bladder cancer     'started chemo 07/2013 " (Aug 18, 2013)  . OSA on CPAP   . Pneumonia   . CAD (coronary artery disease)     a. NSTEMI 07/2013:  LHC (08/03/13): mLAD 95% assoc w/ thrombus, dLAD 50%, pRCA 80%, mRCA 50%, normal LVF. => PCI:  Rebel (4 x 12 mm) BMS to the mLAD and Rebel (4 x 15 mm)  BMS to the pRCA.  . Osteoarthritis   . Dyslipidemia     Lipitor started at time of NSTEMI 07/2013    Current Outpatient Prescriptions  Medication Sig Dispense Refill  . acetaminophen (TYLENOL) 500 MG tablet Take 500 mg by mouth every 6 (six) hours as needed for fever.      Marland Kitchen aspirin 81 MG tablet Take 81 mg by mouth daily.      Marland Kitchen atorvastatin (LIPITOR) 40 MG tablet Take 1 tablet (40 mg total) by mouth daily.  90 tablet  1  . carvedilol (COREG) 6.25 MG tablet Take 1 tablet (6.25 mg total) by mouth 2 (two) times daily.  180 tablet  3  . Dexamethasone (DECADRON PO) Take 1 tablet by mouth daily as needed (after chemo treatment).      . famotidine (PEPCID) 20 MG tablet Take 1 tablet (20 mg total) by mouth 2 (two) times daily.  180 tablet  3  . filgrastim (NEUPOGEN) 480 MCG/0.8ML SOLN injection Inject into the skin. Inject 480 mcg into the skin daily. Inject into skin subcutaneously once daily 2,3 and 4 days after start of chemotherapy, and following day 8 chemotherapy give days 9,10,11,12,13,14,15      . lidocaine-prilocaine (EMLA) cream Apply topically.      Marland Kitchen loperamide (IMODIUM A-D) 2 MG tablet Take 2 mg by mouth 3 (three) times daily as needed for diarrhea or loose stools.      . nitroGLYCERIN (NITROSTAT) 0.4 MG SL tablet Place 0.4 mg under the tongue every 5 (five) minutes as needed for chest pain.       Marland Kitchen sertraline (ZOLOFT) 100 MG tablet Take 150 mg by mouth daily.       Marland Kitchen triamcinolone cream (KENALOG) 0.1 % Apply 1 application topically daily as needed (for itching).      . venlafaxine XR (EFFEXOR-XR) 150 MG 24 hr capsule Take 150 mg by mouth daily.        No current facility-administered medications for this visit.    Allergies:   Morphine; Other; Adhesive; Bupropion; Flomax; Pantoprazole sodium; and Tamsulosin   Social History:  The patient  reports that he has quit smoking. His smoking use included Cigarettes. He has a 22.5 pack-year smoking history. He has never used smokeless  tobacco. He reports that he drinks alcohol. He reports that he does not use illicit drugs.   Family History:  The patient's family history includes Coronary artery disease in an other family member; Heart failure in his father; Kidney failure in his mother; Stroke in his father.   ROS:  Please see the history of present illness.   He has occasional bruising.   All other systems reviewed and negative.   PHYSICAL EXAM: VS:  BP 116/70  Pulse 60  Ht 5\' 8"  (1.727 m)  Wt 254 lb 12.8 oz (115.577 kg)  BMI 38.75 kg/m2 Well nourished, well developed, in no acute distress HEENT: normal Neck: no JVD Cardiac:  normal S1, S2; RR; no murmur Lungs:  clear to auscultation bilaterally, no wheezing, rhonchi or rales Abd: soft, nontender, no hepatomegaly Ext: no edema; right wrist without hematoma or mass  Skin: warm and dry Neuro:  CNs 2-12 intact, no focal abnormalities noted  EKG:     ASSESSMENT AND PLAN:

## 2014-02-08 ENCOUNTER — Other Ambulatory Visit: Payer: Self-pay | Admitting: Dermatology

## 2014-04-21 ENCOUNTER — Other Ambulatory Visit: Payer: Self-pay | Admitting: Cardiovascular Disease

## 2014-04-29 ENCOUNTER — Encounter: Payer: Self-pay | Admitting: Cardiovascular Disease

## 2014-04-30 ENCOUNTER — Ambulatory Visit (INDEPENDENT_AMBULATORY_CARE_PROVIDER_SITE_OTHER): Payer: 59

## 2014-04-30 DIAGNOSIS — Z23 Encounter for immunization: Secondary | ICD-10-CM

## 2014-06-27 ENCOUNTER — Ambulatory Visit: Payer: Medicare Other | Admitting: Cardiovascular Disease

## 2014-07-19 ENCOUNTER — Other Ambulatory Visit: Payer: Self-pay | Admitting: Cardiovascular Disease

## 2014-07-20 ENCOUNTER — Encounter (HOSPITAL_COMMUNITY): Payer: Self-pay | Admitting: Cardiovascular Disease

## 2014-07-21 ENCOUNTER — Other Ambulatory Visit: Payer: Self-pay | Admitting: Dermatology

## 2014-07-25 ENCOUNTER — Ambulatory Visit (INDEPENDENT_AMBULATORY_CARE_PROVIDER_SITE_OTHER): Payer: Medicare Other | Admitting: Cardiovascular Disease

## 2014-07-25 ENCOUNTER — Encounter: Payer: Self-pay | Admitting: Cardiovascular Disease

## 2014-07-25 VITALS — BP 130/62 | HR 65 | Ht 68.0 in | Wt 242.0 lb

## 2014-07-25 DIAGNOSIS — I2510 Atherosclerotic heart disease of native coronary artery without angina pectoris: Secondary | ICD-10-CM

## 2014-07-25 NOTE — Assessment & Plan Note (Signed)
He has not had any CP.  He has mild dyspnea - related to getting chemotherapy. Continue current meds.

## 2014-07-25 NOTE — Patient Instructions (Signed)
Your physician recommends that you continue on your current medications as directed. Please refer to the Current Medication list given to you today.  Your physician wants you to follow-up in: 6 months with Dr. Nahser.  You will receive a reminder letter in the mail two months in advance. If you don't receive a letter, please call our office to schedule the follow-up appointment.  

## 2014-07-25 NOTE — Progress Notes (Signed)
[place], [place], [place]  [address] Phone: [phone] Fax:  [phone]  Date:  07/25/2014   ID:  James [name], DOB July 03, 1936, MRN [id number]  PCP:  Horatio Pel, MD  Cardiologist:  Dr. Liam Rogers   Electrophysiologist:  Dr. Virl Axe     Problem List: 1. Coronary artery disease-status post stenting of his mid LAD and proximal RCA ( 08/03/14)  2. History of atrial flutter-status post radiofrequency ablation in 2012 3. Metastatic bladder cancer 4. Obstructive sleep apnea 5. Dyslipidemia  History of Present Illness: James [name] is a 78 y.o. male with a hx of AFlutter s/p RFCA in 2012 and metastatic bladder CA followed by [facility].  Echocardiogram (01/2011): Mild LVH, EF 55-65%, trivial AI, mild MR, mild to moderate LAE, mild RAE, PASP 31, trivial effusion.    He was admitted 08/02/13-08/04/13 with a NSTEMI.  Chest CTA was negative for pulmonary embolism.  LHC (08/03/13): Mid LAD 95% associated with thrombus, distal LAD 50%, proximal RCA 80% mid RCA 50%, normal LV function. PCI:  Rebel (4 x 12 mm) BMS to the mid LAD and Rebel (4 x 15 mm) BMS to the proximal RCA. Aspirin and Plavix was recommended for a minimum of 30 days or 12 months if tolerated.    Feb. 16, 2015:  James [name] was admitted to [facility] since I last saw him for  a platelet transfusion ,  PRBC transfusion.    He has continued to have chemotherapy.   He has not had any further CP.  He has had some DOE ( walking up stairs).  Nothing similar to his MI pain in late Dec. 2014.    He has been measuring his BP on occasion.  He has been going to cardiac rehab up until last week.  He stopped because of fatigue during the exercise.  This was at a nadir of his counts.   Plavix was stopped on Feb. 7 because of his thrombocytopenia.   He has had anemia that has limited his chemotherapy regimin.   The chemo has been altered because of these hematologic.  His most recent Hb is 8.9.   He has tunnel vision at times when he gets up too suddenly  ( I think he is having some orthostasis)   October 26, 2013: James [name] is seen as a work in visit. He had been having symptoms of orthostatic hypotension.  These episodes last for several seconds - are associated with 4-6 seconds of narrow vision.  The episodes resolved spontaneously.    The metoprolol was stopped.   Since that time, he's had increasing angina-especially at cardiac rehabilitation.  i received a call from [place].  He went home and has done well.   The ECG at cardiac rehab was OK, OSA, metastatic bladder cancer,   Dec 26, 2013:  James [name] has been able to do all of his normal activities - went [place] the [place] [facility])   and walked his dogs without problems.    He thinks that sometimes, the Cp is related to swallowing.    He is having fewer episodes of orthostatic hypotension  His lymph nodes are not shrinking .  He will restart chemo again.    Dec. 15, 2015:  James [name] is a 78 yo with hx of CAD , obstructive sleep apnea, metastatic bladder cancer, atrial flutter - s/p ablation. He has some shortness of breath ( typically related to his chemotherapy)   No  CP .  He is followed at [facility] now for his bladder cancer. He has had some leg edema - related to the chemo. Also has a raging skin reaction.     Recent Labs: 09/01/2013: Pro B Natriuretic peptide (BNP) 79.7 08/04/2013: Hemoglobin 10.8* 10/11/2013: ALT 35; Creatinine 1.3; HDL Cholesterol by NMR 24.50*; LDL (calc) 47; Potassium 4.21/09/2013:  K 4.6, creatinine 1.46, ALT 24, Hgb 11.9 ([facility])  Wt Readings from Last 3 Encounters:  07/25/14 242 lb (109.77 kg)  12/26/13 254 lb 12.8 oz (115.577 kg)  11/02/13 259 lb (117.482 kg)     Past Medical History  Diagnosis Date  . Atrial flutter 05/2009    STATUS POST CARDIOVERSION;  s/p RFCA in 2012 (Dr. Caryl Comes)  . Depression   . History of immunotherapy 2014    "had many times before and it worked; had a  severe reaction 02-08-13; they coded me, I had sepsis; almost died" (09/01/2013)  . Bladder cancer     'started chemo 07/2013 " (09-01-13)  . OSA on CPAP   . Pneumonia   . CAD (coronary artery disease)     a. NSTEMI 07/2013:  LHC (08/03/13): mLAD 95% assoc w/ thrombus, dLAD 50%, pRCA 80%, mRCA 50%, normal LVF. => PCI:  Rebel (4 x 12 mm) BMS to the mLAD and Rebel (4 x 15 mm) BMS to the pRCA.  . Osteoarthritis   . Dyslipidemia     Lipitor started at time of NSTEMI 07/2013    Current Outpatient Prescriptions  Medication Sig Dispense Refill  . acetaminophen (TYLENOL) 500 MG tablet Take 500 mg by mouth every 6 (six) hours as needed for fever.    Marland Kitchen aspirin 81 MG tablet Take 81 mg by mouth daily.    Marland Kitchen atorvastatin (LIPITOR) 40 MG tablet TAKE 1 TABLET (40 MG TOTAL) BY MOUTH DAILY. 90 tablet 0  . carvedilol (COREG) 6.25 MG tablet Take 1 tablet (6.25 mg total) by mouth 2 (two) times daily. 180 tablet 3  . Dexamethasone (DECADRON PO) Take 1 tablet by mouth daily as needed (after chemo treatment).    . ferrous sulfate 325 (65 FE) MG tablet Take 325 mg by mouth 2 (two) times daily with a meal.    . filgrastim (NEUPOGEN) 480 MCG/0.8ML SOLN injection Inject into the skin. Inject 480 mcg into the skin daily. Inject into skin subcutaneously once daily 2,3 and 4 days after start of chemotherapy, and following day 8 chemotherapy give days 9,10,11,12,13,14,15    . Garlic 834 MG CAPS Take 300 mg by mouth daily.    Marland Kitchen lidocaine-prilocaine (EMLA) cream Apply topically.    Marland Kitchen loperamide (IMODIUM A-D) 2 MG tablet Take 2 mg by mouth 3 (three) times daily as needed for diarrhea or loose stools.    . MULTIPLE VITAMIN PO Take by mouth daily.    . Nettle, Urtica Dioica, (NETTLE LEAF PO) Take 300 mg by mouth daily.    . nitroGLYCERIN (NITROSTAT) 0.4 MG SL tablet Place 0.4 mg under the tongue every 5 (five) minutes as needed for chest pain.     Baruch Gouty [place] ONC ACP 196 eIRB #[id number] capsule  (ascerta ACE ST  297) 100mg  twice daiy    . Omega-3 Fatty Acids (OMEGA 3 PO) Take 1 capsule by mouth 2 (two) times daily. [phone] mg    . OVER THE COUNTER MEDICATION Broccamax 380mg  daily    . OVER THE COUNTER MEDICATION [place] 1100mg  daily    . OVER THE COUNTER  MEDICATION Cell Builder Inistol daily    . predniSONE (DELTASONE) 20 MG tablet Take 60 mg by mouth daily with breakfast.    . Probiotic Product (PROBIOTIC & ACIDOPHILUS EX ST PO) Take by mouth daily.    . sertraline (ZOLOFT) 100 MG tablet Take 150 mg by mouth daily.     Marland Kitchen triamcinolone cream (KENALOG) 0.1 % Apply 1 application topically daily as needed (for itching).    . venlafaxine XR (EFFEXOR-XR) 150 MG 24 hr capsule Take 150 mg by mouth daily.      No current facility-administered medications for this visit.    Allergies:   Morphine; Other; Adhesive; Bupropion; Flomax; Pantoprazole sodium; and Tamsulosin   Social History:  The patient  reports that he has quit smoking. His smoking use included Cigarettes. He has a 22.5 pack-year smoking history. He has never used smokeless tobacco. He reports that he drinks alcohol. He reports that he does not use illicit drugs.   Family History:  The patient's family history includes Coronary artery disease in an other family member; Heart failure in his father; Kidney failure in his mother; Stroke in his father.   ROS:  Please see the history of present illness.   He has occasional bruising.   All other systems reviewed and negative.   PHYSICAL EXAM: VS:  BP 130/62 mmHg  Pulse 65  Ht 5\' 8"  (1.727 m)  Wt 242 lb (109.77 kg)  BMI 36.80 kg/m2  SpO2 96% Well nourished, well developed, in no acute distress HEENT: normal Neck: no JVD Cardiac:  normal S1, S2; RR; no murmur Lungs:  clear to auscultation bilaterally, no wheezing, rhonchi or rales Abd: soft, nontender, no hepatomegaly Ext: red rash on his legs and trunk,  [place],  Mild edema Skin: warm and dry Neuro:  CNs 2-12 intact, no focal  abnormalities noted  EKG:     ASSESSMENT AND PLAN:

## 2014-08-28 ENCOUNTER — Inpatient Hospital Stay (HOSPITAL_COMMUNITY)
Admission: EM | Admit: 2014-08-28 | Discharge: 2014-09-04 | DRG: 864 | Disposition: A | Discharge status: Home Health | Payer: Medicare Other | Attending: Internal Medicine | Admitting: Internal Medicine

## 2014-08-28 ENCOUNTER — Encounter (HOSPITAL_COMMUNITY): Payer: Self-pay | Admitting: Emergency Medicine

## 2014-08-28 ENCOUNTER — Emergency Department (HOSPITAL_COMMUNITY): Payer: Medicare Other

## 2014-08-28 DIAGNOSIS — T380X5A Adverse effect of glucocorticoids and synthetic analogues, initial encounter: Secondary | ICD-10-CM | POA: Diagnosis not present

## 2014-08-28 DIAGNOSIS — F329 Major depressive disorder, single episode, unspecified: Secondary | ICD-10-CM | POA: Diagnosis present

## 2014-08-28 DIAGNOSIS — Z885 Allergy status to narcotic agent status: Secondary | ICD-10-CM

## 2014-08-28 DIAGNOSIS — C679 Malignant neoplasm of bladder, unspecified: Secondary | ICD-10-CM | POA: Diagnosis present

## 2014-08-28 DIAGNOSIS — E785 Hyperlipidemia, unspecified: Secondary | ICD-10-CM | POA: Diagnosis present

## 2014-08-28 DIAGNOSIS — L930 Discoid lupus erythematosus: Secondary | ICD-10-CM | POA: Diagnosis present

## 2014-08-28 DIAGNOSIS — Z87891 Personal history of nicotine dependence: Secondary | ICD-10-CM

## 2014-08-28 DIAGNOSIS — M1990 Unspecified osteoarthritis, unspecified site: Secondary | ICD-10-CM | POA: Diagnosis present

## 2014-08-28 DIAGNOSIS — D630 Anemia in neoplastic disease: Secondary | ICD-10-CM | POA: Diagnosis present

## 2014-08-28 DIAGNOSIS — Z7952 Long term (current) use of systemic steroids: Secondary | ICD-10-CM | POA: Diagnosis not present

## 2014-08-28 DIAGNOSIS — Z8249 Family history of ischemic heart disease and other diseases of the circulatory system: Secondary | ICD-10-CM

## 2014-08-28 DIAGNOSIS — Z955 Presence of coronary angioplasty implant and graft: Secondary | ICD-10-CM | POA: Diagnosis not present

## 2014-08-28 DIAGNOSIS — L270 Generalized skin eruption due to drugs and medicaments taken internally: Secondary | ICD-10-CM | POA: Diagnosis present

## 2014-08-28 DIAGNOSIS — R739 Hyperglycemia, unspecified: Secondary | ICD-10-CM | POA: Diagnosis not present

## 2014-08-28 DIAGNOSIS — L932 Other local lupus erythematosus: Secondary | ICD-10-CM | POA: Diagnosis present

## 2014-08-28 DIAGNOSIS — G4730 Sleep apnea, unspecified: Secondary | ICD-10-CM | POA: Diagnosis present

## 2014-08-28 DIAGNOSIS — E876 Hypokalemia: Secondary | ICD-10-CM | POA: Diagnosis not present

## 2014-08-28 DIAGNOSIS — Z91048 Other nonmedicinal substance allergy status: Secondary | ICD-10-CM | POA: Diagnosis not present

## 2014-08-28 DIAGNOSIS — G9341 Metabolic encephalopathy: Secondary | ICD-10-CM | POA: Diagnosis present

## 2014-08-28 DIAGNOSIS — R509 Fever, unspecified: Principal | ICD-10-CM | POA: Diagnosis present

## 2014-08-28 DIAGNOSIS — R0602 Shortness of breath: Secondary | ICD-10-CM

## 2014-08-28 DIAGNOSIS — Z7982 Long term (current) use of aspirin: Secondary | ICD-10-CM

## 2014-08-28 DIAGNOSIS — Z888 Allergy status to other drugs, medicaments and biological substances status: Secondary | ICD-10-CM | POA: Diagnosis not present

## 2014-08-28 DIAGNOSIS — C799 Secondary malignant neoplasm of unspecified site: Secondary | ICD-10-CM | POA: Diagnosis present

## 2014-08-28 DIAGNOSIS — R410 Disorientation, unspecified: Secondary | ICD-10-CM | POA: Diagnosis present

## 2014-08-28 DIAGNOSIS — I4892 Unspecified atrial flutter: Secondary | ICD-10-CM | POA: Diagnosis present

## 2014-08-28 DIAGNOSIS — Z79899 Other long term (current) drug therapy: Secondary | ICD-10-CM

## 2014-08-28 DIAGNOSIS — I2510 Atherosclerotic heart disease of native coronary artery without angina pectoris: Secondary | ICD-10-CM | POA: Diagnosis present

## 2014-08-28 DIAGNOSIS — D696 Thrombocytopenia, unspecified: Secondary | ICD-10-CM | POA: Diagnosis present

## 2014-08-28 DIAGNOSIS — D649 Anemia, unspecified: Secondary | ICD-10-CM | POA: Diagnosis present

## 2014-08-28 DIAGNOSIS — I252 Old myocardial infarction: Secondary | ICD-10-CM

## 2014-08-28 DIAGNOSIS — G4733 Obstructive sleep apnea (adult) (pediatric): Secondary | ICD-10-CM | POA: Diagnosis present

## 2014-08-28 DIAGNOSIS — Z906 Acquired absence of other parts of urinary tract: Secondary | ICD-10-CM | POA: Diagnosis present

## 2014-08-28 DIAGNOSIS — G9340 Encephalopathy, unspecified: Secondary | ICD-10-CM

## 2014-08-28 LAB — URINALYSIS, ROUTINE W REFLEX MICROSCOPIC
Bilirubin Urine: NEGATIVE
Glucose, UA: NEGATIVE mg/dL
Ketones, ur: NEGATIVE mg/dL
Leukocytes, UA: NEGATIVE
Nitrite: NEGATIVE
Protein, ur: NEGATIVE mg/dL
Specific Gravity, Urine: 1.026 (ref 1.005–1.030)
Urobilinogen, UA: 1 mg/dL (ref 0.0–1.0)
pH: 6 (ref 5.0–8.0)

## 2014-08-28 LAB — URINE MICROSCOPIC-ADD ON

## 2014-08-28 LAB — COMPREHENSIVE METABOLIC PANEL
ALT: 29 U/L (ref 0–53)
AST: 28 U/L (ref 0–37)
Albumin: 3.4 g/dL — ABNORMAL LOW (ref 3.5–5.2)
Alkaline Phosphatase: 46 U/L (ref 39–117)
Anion gap: 10 (ref 5–15)
BILIRUBIN TOTAL: 0.9 mg/dL (ref 0.3–1.2)
BUN: 26 mg/dL — ABNORMAL HIGH (ref 6–23)
CALCIUM: 8.2 mg/dL — AB (ref 8.4–10.5)
CHLORIDE: 97 meq/L (ref 96–112)
CO2: 24 mmol/L (ref 19–32)
CREATININE: 1.18 mg/dL (ref 0.50–1.35)
GFR calc non Af Amer: 58 mL/min — ABNORMAL LOW (ref >90)
GFR, EST AFRICAN AMERICAN: 67 mL/min — AB (ref >90)
Glucose, Bld: 119 mg/dL — ABNORMAL HIGH (ref 70–99)
POTASSIUM: 4.1 mmol/L (ref 3.5–5.1)
Sodium: 131 mmol/L — ABNORMAL LOW (ref 135–145)
Total Protein: 6.3 g/dL (ref 6.0–8.3)

## 2014-08-28 LAB — CBC WITH DIFFERENTIAL/PLATELET
Basophils Absolute: 0 10*3/uL (ref 0.0–0.1)
Basophils Relative: 0 % (ref 0–1)
EOS ABS: 0 10*3/uL (ref 0.0–0.7)
Eosinophils Relative: 0 % (ref 0–5)
HCT: 37.8 % — ABNORMAL LOW (ref 39.0–52.0)
HEMOGLOBIN: 12.7 g/dL — AB (ref 13.0–17.0)
LYMPHS PCT: 5 % — AB (ref 12–46)
Lymphs Abs: 0.3 10*3/uL — ABNORMAL LOW (ref 0.7–4.0)
MCH: 33.3 pg (ref 26.0–34.0)
MCHC: 33.6 g/dL (ref 30.0–36.0)
MCV: 99.2 fL (ref 78.0–100.0)
Monocytes Absolute: 0.8 10*3/uL (ref 0.1–1.0)
Monocytes Relative: 13 % — ABNORMAL HIGH (ref 3–12)
Neutro Abs: 5.4 10*3/uL (ref 1.7–7.7)
Neutrophils Relative %: 82 % — ABNORMAL HIGH (ref 43–77)
Platelets: 116 10*3/uL — ABNORMAL LOW (ref 150–400)
RBC: 3.81 MIL/uL — AB (ref 4.22–5.81)
RDW: 19.1 % — ABNORMAL HIGH (ref 11.5–15.5)
WBC: 6.6 10*3/uL (ref 4.0–10.5)

## 2014-08-28 LAB — CSF CELL COUNT WITH DIFFERENTIAL
RBC Count, CSF: 1229 /mm3 — ABNORMAL HIGH
RBC Count, CSF: 18 /mm3 — ABNORMAL HIGH
TUBE #: 1
Tube #: 4
WBC CSF: 1 /mm3 (ref 0–5)
WBC, CSF: 3 /mm3 (ref 0–5)

## 2014-08-28 LAB — PROTEIN, CSF: Total  Protein, CSF: 51 mg/dL — ABNORMAL HIGH (ref 15–45)

## 2014-08-28 LAB — GRAM STAIN: Gram Stain: NONE SEEN

## 2014-08-28 LAB — GLUCOSE, CSF: GLUCOSE CSF: 71 mg/dL (ref 43–76)

## 2014-08-28 LAB — I-STAT CG4 LACTIC ACID, ED: Lactic Acid, Venous: 1.43 mmol/L (ref 0.5–2.2)

## 2014-08-28 MED ORDER — OSELTAMIVIR PHOSPHATE 75 MG PO CAPS
75.0000 mg | ORAL_CAPSULE | Freq: Two times a day (BID) | ORAL | Status: DC
  Administered 2014-08-28 – 2014-08-29 (×2): 75 mg via ORAL
  Filled 2014-08-28 (×3): qty 1

## 2014-08-28 MED ORDER — LIDOCAINE HCL 2 % IJ SOLN
10.0000 mL | Freq: Once | INTRAMUSCULAR | Status: AC
  Administered 2014-08-28: 200 mg
  Filled 2014-08-28: qty 20

## 2014-08-28 MED ORDER — ACETAMINOPHEN 325 MG PO TABS
650.0000 mg | ORAL_TABLET | Freq: Four times a day (QID) | ORAL | Status: DC | PRN
  Filled 2014-08-28: qty 2

## 2014-08-28 MED ORDER — SODIUM CHLORIDE 0.9 % IV BOLUS (SEPSIS)
1000.0000 mL | Freq: Once | INTRAVENOUS | Status: AC
  Administered 2014-08-28: 1000 mL via INTRAVENOUS

## 2014-08-28 MED ORDER — IBUPROFEN 800 MG PO TABS
800.0000 mg | ORAL_TABLET | Freq: Once | ORAL | Status: AC
  Administered 2014-08-28: 800 mg via ORAL
  Filled 2014-08-28: qty 1

## 2014-08-28 NOTE — ED Notes (Signed)
Resting quietly with eye closed. Easily arousable. Verbally responsive. Resp even and unlabored. ABC's intact. NAD noted. Daughter left to go home to get c-pap machine.

## 2014-08-28 NOTE — ED Notes (Signed)
Resting quietly with eye closed. Easily arousable. Verbally responsive. Resp even and unlabored. No audible adventitious breath sounds noted. ABC's intact. Family at bedside.

## 2014-08-28 NOTE — ED Notes (Signed)
Pt reports used CPAP for rest and sleep at home. Pt resting at present time with oxygen saturation 86% on RA. Pt placed on 2 lpm [place] for comfort while resting.

## 2014-08-28 NOTE — ED Notes (Addendum)
Patient states that he had "a chill" when he laid down today for a nap and was not easily aroused when by his daughters who states that he was confused. Answering all questions appropriate. Grip strength and leg strength equal bilaterally. Patient is SOB while speaking. Just taken off of a cancer trial for bladder ca. Took 650 tylenol at 4:30pm.

## 2014-08-28 NOTE — ED Notes (Signed)
Pt to CT scan and returned without distress.

## 2014-08-28 NOTE — Progress Notes (Signed)
  CARE MANAGEMENT ED NOTE 08/28/2014  Patient:  James [name], James [name]   Account Number:  [id number]  Date Initiated:  08/28/2014  Documentation initiated by:  James [name]  Subjective/Objective Assessment:   Patient presents to Ed with increased confusion and fever of 102.2     Subjective/Objective Assessment Detail:   Patient with pmhx of pneumonia, depression, a flutter, baldder cancer, CAD.     Action/Plan:   Action/Plan Detail:   Anticipated DC Date:       Status Recommendation to Physician:   Result of Recommendation:    Other ED [place]  Other  PCP issues    Choice offered to / List presented to:            Status of service:  Completed, signed off  ED Comments:   ED Comments Detail:  EDCM spoke to patient and [name] at bedside. Patient reports he lives at home with his wife (who is out of town), and his two dogs and a daughter when she is home from school.  Patient does nt ohave any home health services at this time and has never had hom health services.  Patient reports he is able to perform ADL's without difficulty at home.  Patient has a cpap machine, blood pressure cuff and medical alert necklace at home. Patient reports there is a shower chair, but he doesn't use it.  Patient reports his pcp is Dr. Deland Pretty and hasn't seen his pcp since August when he had he check up.  [facility].  No further EDCM needs at this time.

## 2014-08-28 NOTE — ED Notes (Signed)
Called to give report and Ebony Hail, Network engineer stated that she will have floor nurse to return call.

## 2014-08-28 NOTE — ED Notes (Signed)
Pt reports noticed freezing cold and hot starting last night. Family reports he isn't able to get thoughts across, short of breath on exertion, and unable to walk as far. Pt able to answer all questions for mini mental exam with delay in response.

## 2014-08-28 NOTE — ED Notes (Addendum)
Dr. Darl Householder at bedside to do LP. Pt tolerated well.

## 2014-08-28 NOTE — ED Notes (Signed)
Bed: WA06 Expected date:  Expected time:  Means of arrival:  Comments: Hold for [place]

## 2014-08-28 NOTE — ED Provider Notes (Signed)
CSN: [id number]     Arrival date & time 08/28/14  1712 History   First MD Initiated Contact with Patient 08/28/14 1732     Chief Complaint  Patient presents with  . Fever     (Consider location/radiation/quality/duration/timing/severity/associated sxs/prior Treatment) The history is provided by the patient and a relative.  James [name] is a 79 y.o. male history of a flutter, stage 4 bladder cancer on prednisone (finished a course of chemo), here presenting with chills and altered mental status. He woke up from a nap this afternoon and suddenly felt warm. As per daughter, he became more confused. Denies cough or abdominal pain or urinary symptoms. Had flu shot last year. Denies neck pain or stiffness.    Level V caveat- AMS   Past Medical History  Diagnosis Date  . Atrial flutter 05/2009    STATUS POST CARDIOVERSION;  s/p RFCA in 2012 (Dr. Caryl Comes)  . Depression   . History of immunotherapy 2014    "had many times before and it worked; had a severe reaction 2013/03/05; they coded me, I had sepsis; almost died" (08-28-13)  . Bladder cancer     'started chemo 07/2013 " (28-Aug-2013)  . OSA on CPAP   . Pneumonia   . CAD (coronary artery disease)     a. NSTEMI 07/2013:  LHC (08/03/13): mLAD 95% assoc w/ thrombus, dLAD 50%, pRCA 80%, mRCA 50%, normal LVF. => PCI:  Rebel (4 x 12 mm) BMS to the mLAD and Rebel (4 x 15 mm) BMS to the pRCA.  . Osteoarthritis   . Dyslipidemia     Lipitor started at time of NSTEMI 07/2013   Past Surgical History  Procedure Laterality Date  . Carpal tunnel release Bilateral ~ 2012  . Atrial flutter ablation  2012  . Cardioversion  05/2009  . Transurethral resection of bladder tumor      "several" (08/28/2013)  . Cystoscopy  4-12/2012    "biopsies; removal of tissue" (08-28-13)  . Renal mass excision  03/2013    "I've had 2 of these; percutaneous" (08/28/13)  . Lymph node biopsy  06/2013    "in the area of the kidney" (2013-08-28)  . Tonsillectomy and  adenoidectomy  1940's  . Left heart catheterization with coronary angiogram N/A 08/03/2013    Procedure: LEFT HEART CATHETERIZATION WITH CORONARY ANGIOGRAM;  Surgeon: Ramond Dial, MD;  Location: [facility] CATH LAB;  Service: Cardiovascular;  Laterality: N/A;  . Percutaneous coronary stent intervention (pci-s)  08/03/2013    Procedure: PERCUTANEOUS CORONARY STENT INTERVENTION (PCI-S);  Surgeon: Ramond Dial, MD;  Location: [facility] CATH LAB;  Service: Cardiovascular;;   Family History  Problem Relation Age of Onset  . Heart failure Father   . Stroke Father   . Kidney failure Mother   . Coronary artery disease     History  Substance Use Topics  . Smoking status: Former Smoker -- 1.50 packs/day for 15 years    Types: Cigarettes  . Smokeless tobacco: Never Used     Comment: 08-28-2013 "quit smoking in 1984"  . Alcohol Use: Yes     Comment: 28-Aug-2013 "hx of abuse; clean since 05/29/1981"    Review of Systems  Constitutional: Positive for fever.  Psychiatric/Behavioral: Positive for confusion.  All other systems reviewed and are negative.     Allergies  Morphine; Other; Adhesive; Bupropion; Flomax; Pantoprazole sodium; and Tamsulosin  Home Medications   Prior to Admission medications   Medication Sig Start Date End Date Taking?  Authorizing Provider  acetaminophen (TYLENOL) 500 MG tablet Take 500 mg by mouth every 6 (six) hours as needed for fever.   Yes Historical Provider, MD  antiseptic oral rinse (BIOTENE) LIQD 15 mLs by Mouth Rinse route as needed for dry mouth.   Yes Historical Provider, MD  aspirin 81 MG tablet Take 81 mg by mouth daily.   Yes Historical Provider, MD  atorvastatin (LIPITOR) 40 MG tablet TAKE 1 TABLET (40 MG TOTAL) BY MOUTH DAILY. 07/19/14  Yes Thayer Headings, MD  carvedilol (COREG) 6.25 MG tablet Take 1 tablet (6.25 mg total) by mouth 2 (two) times daily. 10/26/13  Yes Thayer Headings, MD  Dexamethasone (DECADRON PO) Take 1 tablet by mouth daily as needed  (after chemo treatment).   Yes Historical Provider, MD  ferrous sulfate 325 (65 FE) MG tablet Take 325 mg by mouth 2 (two) times daily with a meal.   Yes Historical Provider, MD  Garlic 833 MG CAPS Take 300 mg by mouth daily.   Yes Historical Provider, MD  lidocaine-prilocaine (EMLA) cream Apply topically as needed for port   Yes Historical Provider, MD  loperamide (IMODIUM A-D) 2 MG tablet Take 2 mg by mouth 3 (three) times daily as needed for diarrhea or loose stools.   Yes Historical Provider, MD  MULTIPLE VITAMIN PO Take by mouth daily.   Yes Historical Provider, MD  Nettle, Urtica Dioica, (NETTLE LEAF PO) Take 300 mg by mouth daily.   Yes Historical Provider, MD  nitroGLYCERIN (NITROSTAT) 0.4 MG SL tablet Place 0.4 mg under the tongue every 5 (five) minutes as needed for chest pain.    Yes Historical Provider, MD  Omega-3 Fatty Acids (OMEGA 3 PO) Take 1 capsule by mouth 2 (two) times daily. [phone] mg   Yes Historical Provider, MD  OVER THE COUNTER MEDICATION Cell Builder Inistol daily   Yes Historical Provider, MD  predniSONE (DELTASONE) 20 MG tablet Take 20 mg by mouth daily with breakfast.    Yes Historical Provider, MD  Probiotic Product (PROBIOTIC & ACIDOPHILUS EX ST PO) Take by mouth daily.   Yes Historical Provider, MD  sertraline (ZOLOFT) 100 MG tablet Take 150 mg by mouth daily.    Yes Historical Provider, MD  triamcinolone cream (KENALOG) 0.1 % Apply 1 application topically daily as needed (for itching).   Yes Historical Provider, MD  venlafaxine XR (EFFEXOR-XR) 150 MG 24 hr capsule Take 150 mg by mouth daily.    Yes Historical Provider, MD  OVER THE COUNTER MEDICATION Broccamax 380mg  daily    Historical Provider, MD  OVER THE COUNTER MEDICATION [place] 1100mg  daily    Historical Provider, MD   BP 126/88 mmHg  Pulse 68  Temp(Src) 98 F (36.7 C) (Oral)  Resp 15  SpO2 100% Physical Exam  Constitutional: He is oriented to person, place, and time.  Chronically ill, ill  appearing   HENT:  Head: Normocephalic.  MM slightly dry. Erythema bilateral cheeks   Eyes: Conjunctivae are normal. Pupils are equal, round, and reactive to light.  Neck:  Slightly dec ROM, no meningeal signs   Cardiovascular: Normal rate, regular rhythm and normal heart sounds.   Pulmonary/Chest: Effort normal and breath sounds normal. No respiratory distress. He has no wheezes. He has no rales.  Abdominal: Soft. Bowel sounds are normal. He exhibits no distension. There is no tenderness. There is no rebound and no guarding.  Musculoskeletal: Normal range of motion. He exhibits no edema or tenderness.  Neurological: He is alert  and oriented to person, place, and time.  Slow to respond but alert and oriented. Nl strength throughout   Skin: Skin is dry.  Diffuse rash on skin (chronic from previous cancer treatment). + rash bilateral cheek, new.   Psychiatric: He has a normal mood and affect. His behavior is normal. Judgment and thought content normal.  Nursing note and vitals reviewed.   ED Course  LUMBAR PUNCTURE Date/Time: 08/28/2014 11:08 PM Performed by: Wandra Arthurs Authorized by: Wandra Arthurs Consent: Verbal consent obtained. Written consent obtained. Risks and benefits: risks, benefits and alternatives were discussed Consent given by: patient Patient understanding: patient states understanding of the procedure being performed Patient consent: the patient's understanding of the procedure matches consent given Procedure consent: procedure consent matches procedure scheduled Relevant documents: relevant documents present and verified Test results: test results available and properly labeled Site marked: the operative site was marked Patient identity confirmed: verbally with patient Time out: Immediately prior to procedure a "time out" was called to verify the correct patient, procedure, equipment, support staff and site/side marked as required. Indications: evaluation for  infection Local anesthetic: lidocaine 2% without epinephrine Anesthetic total: 15 ml Patient sedated: no Preparation: Patient was prepped and draped in the usual sterile fashion. Lumbar space: L3-L4 interspace Patient's position: sitting Needle gauge: 22 Needle type: diamond point Needle length: 3.5 in Number of attempts: 4 Fluid appearance: clear and blood-tinged Tubes of fluid: 4 Total volume: 6 ml Post-procedure: site cleaned and pressure dressing applied Patient tolerance: Patient tolerated the procedure well with no immediate complications   (including critical care time) Labs Review Labs Reviewed  CBC WITH DIFFERENTIAL - Abnormal; Notable for the following:    RBC 3.81 (*)    Hemoglobin 12.7 (*)    HCT 37.8 (*)    RDW 19.1 (*)    Platelets 116 (*)    Neutrophils Relative % 82 (*)    Lymphocytes Relative 5 (*)    Lymphs Abs 0.3 (*)    Monocytes Relative 13 (*)    All other components within normal limits  COMPREHENSIVE METABOLIC PANEL - Abnormal; Notable for the following:    Sodium 131 (*)    Glucose, Bld 119 (*)    BUN 26 (*)    Calcium 8.2 (*)    Albumin 3.4 (*)    GFR calc non Af Amer 58 (*)    GFR calc Af Amer 67 (*)    All other components within normal limits  URINALYSIS, ROUTINE W REFLEX MICROSCOPIC - Abnormal; Notable for the following:    APPearance CLOUDY (*)    Hgb urine dipstick LARGE (*)    All other components within normal limits  CSF CELL COUNT WITH DIFFERENTIAL - Abnormal; Notable for the following:    Color, CSF STRAW (*)    Appearance, CSF HAZY (*)    RBC Count, CSF 1229 (*)    All other components within normal limits  CSF CELL COUNT WITH DIFFERENTIAL - Abnormal; Notable for the following:    Appearance, CSF CLEAR (*)    RBC Count, CSF 18 (*)    All other components within normal limits  PROTEIN, CSF - Abnormal; Notable for the following:    Total  Protein, CSF 51 (*)    All other components within normal limits  GRAM STAIN  CULTURE,  BLOOD (ROUTINE X 2)  CULTURE, BLOOD (ROUTINE X 2)  URINE CULTURE  CSF CULTURE  URINE MICROSCOPIC-ADD ON  GLUCOSE, CSF  INFLUENZA PANEL BY PCR (  TYPE A & B, H1N1)  HERPES SIMPLEX VIRUS(HSV) DNA BY PCR  I-STAT CG4 LACTIC ACID, ED    Imaging Review Ct Head Wo Contrast  08/28/2014   CLINICAL DATA:  Confusion beginning this afternoon. Fever. History of bladder carcinoma.  EXAM: CT HEAD WITHOUT CONTRAST  TECHNIQUE: Contiguous axial images were obtained from the base of the skull through the vertex without intravenous contrast.  COMPARISON:  01/17/2011  FINDINGS: Ventricles are normal in configuration. There is age related mild ventricular and sulcal enlargement. No hydrocephalus.  No parenchymal masses or mass effect. There is no evidence of a cortical infarct.  No extra-axial masses or abnormal fluid collections.  There is no intracranial hemorrhage.  Sinuses and mastoid air cells are clear.  No skull lesion.  IMPRESSION: 1. No acute intracranial abnormalities.  Age related volume loss.   Electronically Signed   By: Lajean Manes M.D.   On: 08/28/2014 19:16   Dg Chest Port 1 View  08/28/2014   CLINICAL DATA:  . Patient states that he had "a chill" when he laid down today for a nap and was not easily aroused when by his daughters who states that he was confused. Answering all questions appropriate. Grip strength and leg strength equal bilaterally. Patient is SOB while speaking. Just taken off of a cancer trial for bladder ca, hx pna, cad, ex-smoker  EXAM: PORTABLE CHEST - 1 VIEW  COMPARISON:  09/09/2013  FINDINGS: There is lung base patchy airspace and interstitial opacity more evident on the left. This is similar to the prior study, may all reflect chronic scarring and atelectasis. Pneumonia is possible.  No pulmonary edema.  No pleural effusion or pneumothorax.  Cardiac silhouette is normal size. No mediastinal or hilar masses. Right anterior chest wall Port-A-Cath is stable with its tip just above the  caval atrial junction.  Bony thorax is demineralized but grossly intact.  IMPRESSION: Lung base opacity, left greater than right, similar to the prior exam. This may all be chronic due to atelectasis/scarring. Pneumonia is possible.  No other evidence of an acute abnormality.  No pulmonary edema.   Electronically Signed   By: Lajean Manes M.D.   On: 08/28/2014 18:04     EKG Interpretation None      MDM   Final diagnoses:  Confusion    James [name] is a 79 y.o. male here with fever. Consider pneumonia vs UTI vs flu vs encephalitis. Will get CT head, sepsis workup. May need LP if he has no source.   11:08 PM No obvious source of infection. CXR showed chronic lung opacity. CSF sent, no obvious meningitis. Given that he was confused and immunosuppressed, I ordered flu and given tamiflu. Cultures sent. Will admit.   Wandra Arthurs, MD 08/28/14 [phone]

## 2014-08-28 NOTE — ED Notes (Signed)
Awake. Verbally responsive. A/O x4. Resp even and unlabored. No audible adventitious breath sounds noted. ABC's intact. SR on monitor at 71. Family at bedside. Pt encourage to lay flat for 6-12hrs after procedure.

## 2014-08-29 ENCOUNTER — Encounter (HOSPITAL_COMMUNITY): Payer: Self-pay | Admitting: Internal Medicine

## 2014-08-29 DIAGNOSIS — G4733 Obstructive sleep apnea (adult) (pediatric): Secondary | ICD-10-CM | POA: Diagnosis present

## 2014-08-29 DIAGNOSIS — R509 Fever, unspecified: Principal | ICD-10-CM

## 2014-08-29 DIAGNOSIS — R739 Hyperglycemia, unspecified: Secondary | ICD-10-CM | POA: Diagnosis present

## 2014-08-29 DIAGNOSIS — G4730 Sleep apnea, unspecified: Secondary | ICD-10-CM

## 2014-08-29 DIAGNOSIS — R410 Disorientation, unspecified: Secondary | ICD-10-CM

## 2014-08-29 DIAGNOSIS — L270 Generalized skin eruption due to drugs and medicaments taken internally: Secondary | ICD-10-CM | POA: Diagnosis present

## 2014-08-29 DIAGNOSIS — L932 Other local lupus erythematosus: Secondary | ICD-10-CM | POA: Diagnosis present

## 2014-08-29 DIAGNOSIS — D649 Anemia, unspecified: Secondary | ICD-10-CM | POA: Diagnosis present

## 2014-08-29 DIAGNOSIS — C799 Secondary malignant neoplasm of unspecified site: Secondary | ICD-10-CM

## 2014-08-29 DIAGNOSIS — C679 Malignant neoplasm of bladder, unspecified: Secondary | ICD-10-CM | POA: Diagnosis present

## 2014-08-29 DIAGNOSIS — D696 Thrombocytopenia, unspecified: Secondary | ICD-10-CM

## 2014-08-29 DIAGNOSIS — T380X5A Adverse effect of glucocorticoids and synthetic analogues, initial encounter: Secondary | ICD-10-CM

## 2014-08-29 LAB — COMPREHENSIVE METABOLIC PANEL
ALBUMIN: 2.7 g/dL — AB (ref 3.5–5.2)
ALT: 26 U/L (ref 0–53)
AST: 24 U/L (ref 0–37)
Alkaline Phosphatase: 39 U/L (ref 39–117)
Anion gap: 7 (ref 5–15)
BUN: 22 mg/dL (ref 6–23)
CALCIUM: 8.1 mg/dL — AB (ref 8.4–10.5)
CO2: 27 mmol/L (ref 19–32)
Chloride: 100 mEq/L (ref 96–112)
Creatinine, Ser: 1.27 mg/dL (ref 0.50–1.35)
GFR calc Af Amer: 62 mL/min — ABNORMAL LOW (ref >90)
GFR calc non Af Amer: 53 mL/min — ABNORMAL LOW (ref >90)
Glucose, Bld: 165 mg/dL — ABNORMAL HIGH (ref 70–99)
Potassium: 3.9 mmol/L (ref 3.5–5.1)
Sodium: 134 mmol/L — ABNORMAL LOW (ref 135–145)
TOTAL PROTEIN: 5.5 g/dL — AB (ref 6.0–8.3)
Total Bilirubin: 0.7 mg/dL (ref 0.3–1.2)

## 2014-08-29 LAB — CBC WITH DIFFERENTIAL/PLATELET
Basophils Absolute: 0 10*3/uL (ref 0.0–0.1)
Basophils Relative: 0 % (ref 0–1)
EOS ABS: 0 10*3/uL (ref 0.0–0.7)
Eosinophils Relative: 0 % (ref 0–5)
HEMATOCRIT: 35.8 % — AB (ref 39.0–52.0)
Hemoglobin: 11.7 g/dL — ABNORMAL LOW (ref 13.0–17.0)
Lymphocytes Relative: 8 % — ABNORMAL LOW (ref 12–46)
Lymphs Abs: 0.4 10*3/uL — ABNORMAL LOW (ref 0.7–4.0)
MCH: 33.2 pg (ref 26.0–34.0)
MCHC: 32.7 g/dL (ref 30.0–36.0)
MCV: 101.7 fL — ABNORMAL HIGH (ref 78.0–100.0)
MONO ABS: 0.4 10*3/uL (ref 0.1–1.0)
Monocytes Relative: 7 % (ref 3–12)
Neutro Abs: 4.7 10*3/uL (ref 1.7–7.7)
Neutrophils Relative %: 85 % — ABNORMAL HIGH (ref 43–77)
PLATELETS: 108 10*3/uL — AB (ref 150–400)
RBC: 3.52 MIL/uL — AB (ref 4.22–5.81)
RDW: 19.2 % — ABNORMAL HIGH (ref 11.5–15.5)
WBC: 5.6 10*3/uL (ref 4.0–10.5)

## 2014-08-29 LAB — URINE CULTURE
COLONY COUNT: NO GROWTH
CULTURE: NO GROWTH

## 2014-08-29 LAB — BRAIN NATRIURETIC PEPTIDE: B NATRIURETIC PEPTIDE 5: 117.9 pg/mL — AB (ref 0.0–100.0)

## 2014-08-29 LAB — EXPECTORATED SPUTUM ASSESSMENT W GRAM STAIN, RFLX TO RESP C

## 2014-08-29 LAB — HERPES SIMPLEX VIRUS(HSV) DNA BY PCR
HSV 1 DNA: NOT DETECTED
HSV 2 DNA: NOT DETECTED

## 2014-08-29 LAB — INFLUENZA PANEL BY PCR (TYPE A & B)
H1N1 flu by pcr: NOT DETECTED
INFLAPCR: NEGATIVE
INFLBPCR: NEGATIVE

## 2014-08-29 LAB — TROPONIN I: Troponin I: <0.03 ng/mL (ref <0.031)

## 2014-08-29 LAB — EXPECTORATED SPUTUM ASSESSMENT W REFEX TO RESP CULTURE

## 2014-08-29 LAB — STREP PNEUMONIAE URINARY ANTIGEN: STREP PNEUMO URINARY ANTIGEN: NEGATIVE

## 2014-08-29 MED ORDER — HYDROMORPHONE HCL 1 MG/ML IJ SOLN
1.0000 mg | INTRAMUSCULAR | Status: DC | PRN
  Administered 2014-08-29 – 2014-08-30 (×5): 1 mg via INTRAVENOUS
  Filled 2014-08-29 (×5): qty 1

## 2014-08-29 MED ORDER — TRIAMCINOLONE ACETONIDE 0.1 % EX CREA
1.0000 "application " | TOPICAL_CREAM | Freq: Every day | CUTANEOUS | Status: DC | PRN
  Filled 2014-08-29: qty 15

## 2014-08-29 MED ORDER — PREDNISONE 20 MG PO TABS
20.0000 mg | ORAL_TABLET | Freq: Every day | ORAL | Status: DC
  Administered 2014-08-29 – 2014-08-31 (×3): 20 mg via ORAL
  Filled 2014-08-29 (×4): qty 1

## 2014-08-29 MED ORDER — VANCOMYCIN HCL 10 G IV SOLR
2000.0000 mg | Freq: Once | INTRAVENOUS | Status: AC
  Administered 2014-08-29: 2000 mg via INTRAVENOUS
  Filled 2014-08-29: qty 2000

## 2014-08-29 MED ORDER — ONDANSETRON HCL 4 MG/2ML IJ SOLN: 4.0000 mg | Freq: Four times a day (QID) | INTRAMUSCULAR | Status: DC | PRN

## 2014-08-29 MED ORDER — TRAMADOL HCL 50 MG PO TABS
50.0000 mg | ORAL_TABLET | Freq: Four times a day (QID) | ORAL | Status: DC | PRN
  Administered 2014-08-29: 50 mg via ORAL
  Filled 2014-08-29: qty 1

## 2014-08-29 MED ORDER — CARVEDILOL 6.25 MG PO TABS
6.2500 mg | ORAL_TABLET | Freq: Two times a day (BID) | ORAL | Status: DC
  Administered 2014-08-29 – 2014-09-04 (×13): 6.25 mg via ORAL
  Filled 2014-08-29 (×17): qty 1

## 2014-08-29 MED ORDER — ACETAMINOPHEN 500 MG PO TABS
500.0000 mg | ORAL_TABLET | Freq: Four times a day (QID) | ORAL | Status: DC | PRN
  Administered 2014-08-30: 500 mg via ORAL
  Filled 2014-08-29: qty 1

## 2014-08-29 MED ORDER — NITROGLYCERIN 0.4 MG SL SUBL: 0.4000 mg | SUBLINGUAL_TABLET | SUBLINGUAL | Status: DC | PRN

## 2014-08-29 MED ORDER — SODIUM CHLORIDE 0.9 % IV SOLN
INTRAVENOUS | Status: AC
  Administered 2014-08-29: 18:00:00 via INTRAVENOUS

## 2014-08-29 MED ORDER — DEXTROSE 5 % IV SOLN
700.0000 mg | Freq: Three times a day (TID) | INTRAVENOUS | Status: DC
  Administered 2014-08-29 (×2): 700 mg via INTRAVENOUS
  Filled 2014-08-29 (×3): qty 14

## 2014-08-29 MED ORDER — DEXTROSE 5 % IV SOLN
1.0000 g | Freq: Three times a day (TID) | INTRAVENOUS | Status: DC
  Administered 2014-08-29 – 2014-09-04 (×20): 1 g via INTRAVENOUS
  Filled 2014-08-29 (×21): qty 1

## 2014-08-29 MED ORDER — SODIUM CHLORIDE 0.9 % IV SOLN
INTRAVENOUS | Status: AC
  Administered 2014-08-29: 02:00:00 via INTRAVENOUS

## 2014-08-29 MED ORDER — LORAZEPAM 2 MG/ML IJ SOLN: 1.0000 mg | Freq: Four times a day (QID) | INTRAMUSCULAR | Status: DC | PRN

## 2014-08-29 MED ORDER — LORAZEPAM 2 MG/ML IJ SOLN: 0.5000 mg | Freq: Four times a day (QID) | INTRAMUSCULAR | Status: DC | PRN

## 2014-08-29 MED ORDER — FERROUS SULFATE 325 (65 FE) MG PO TABS
325.0000 mg | ORAL_TABLET | Freq: Two times a day (BID) | ORAL | Status: DC
  Administered 2014-08-29 – 2014-09-04 (×13): 325 mg via ORAL
  Filled 2014-08-29 (×17): qty 1

## 2014-08-29 MED ORDER — VANCOMYCIN HCL 10 G IV SOLR
1250.0000 mg | INTRAVENOUS | Status: DC
  Administered 2014-08-30 – 2014-09-01 (×3): 1250 mg via INTRAVENOUS
  Filled 2014-08-29 (×3): qty 1250

## 2014-08-29 MED ORDER — ASPIRIN 81 MG PO CHEW
81.0000 mg | CHEWABLE_TABLET | Freq: Every day | ORAL | Status: DC
  Administered 2014-08-29 – 2014-09-04 (×7): 81 mg via ORAL
  Filled 2014-08-29 (×7): qty 1

## 2014-08-29 MED ORDER — LORAZEPAM 2 MG/ML IJ SOLN
1.0000 mg | Freq: Four times a day (QID) | INTRAMUSCULAR | Status: DC | PRN
  Administered 2014-08-29: 1 mg via INTRAVENOUS
  Filled 2014-08-29: qty 1

## 2014-08-29 MED ORDER — ATORVASTATIN CALCIUM 40 MG PO TABS
40.0000 mg | ORAL_TABLET | Freq: Every day | ORAL | Status: DC
  Administered 2014-08-29 – 2014-09-03 (×6): 40 mg via ORAL
  Filled 2014-08-29 (×7): qty 1

## 2014-08-29 MED ORDER — VENLAFAXINE HCL ER 150 MG PO CP24
150.0000 mg | ORAL_CAPSULE | Freq: Every day | ORAL | Status: DC
  Administered 2014-08-29 – 2014-09-04 (×7): 150 mg via ORAL
  Filled 2014-08-29 (×7): qty 1

## 2014-08-29 MED ORDER — SERTRALINE HCL 50 MG PO TABS
150.0000 mg | ORAL_TABLET | Freq: Every day | ORAL | Status: DC
  Administered 2014-08-29 – 2014-09-04 (×7): 150 mg via ORAL
  Filled 2014-08-29 (×7): qty 1

## 2014-08-29 NOTE — Progress Notes (Signed)
Patient brought home cpap to wear. RN called service response to come and check machine in the am. RT checked cord and there is no problems. RT will monitor as needed. Family will administer cpap for patient.

## 2014-08-29 NOTE — Progress Notes (Addendum)
ANTIBIOTIC CONSULT NOTE - INITIAL  Pharmacy Consult for Vancomycin, Acyclovir Indication: pneumonia, r/o encephalitis  Allergies  Allergen Reactions  . Morphine Nausea Only    OK if given slowly.  . Other Other (See Comments)    Patient states when he takes flomax and protonix together makes him break out in a rash  . Adhesive [Tape] Itching and Rash    Blisters, tears skin  . Bupropion Hives and Rash  . Flomax [Tamsulosin Hcl] Rash    When taken with protonix only patient can take this alone  . Pantoprazole Sodium Rash    When taken with flomax only patient can take this alone  . Tamsulosin Rash    When taken with protonix only patient can take this alone    Patient Measurements: Height: 5\' 9"  (175.3 cm) Weight: 255 lb 11.7 oz (116 kg) IBW/kg (Calculated) : 70.7   Vital Signs: Temp: 97.7 F (36.5 C) (01/18 2341) Temp Source: Oral (01/18 2341) BP: 112/62 mmHg (01/18 2341) Pulse Rate: 71 (01/18 2341) Intake/Output from previous day:   Intake/Output from this shift:    Labs:  Recent Labs  08/28/14 1729  WBC 6.6  HGB 12.7*  PLT 116*  CREATININE 1.18   Estimated Creatinine Clearance: 66.9 mL/min (by C-G formula based on Cr of 1.18). No results for input(s): VANCOTROUGH, VANCOPEAK, VANCORANDOM, GENTTROUGH, GENTPEAK, GENTRANDOM, TOBRATROUGH, TOBRAPEAK, TOBRARND, AMIKACINPEAK, AMIKACINTROU, AMIKACIN in the last 72 hours.   Microbiology: Recent Results (from the past 720 hour(s))  Gram stain - STAT with CSF culture     Status: None   Collection Time: 08/28/14  8:19 PM  Result Value Ref Range Status   Specimen Description CSF  Final   Special Requests NONE  Final   Gram Stain   Final    NO WBC SEEN NO ORGANISMS SEEN CYTOSPIN PREPARATION NOTIFIED T.NIELSON,RN AT 2203 ON 08/28/14 BY W.SHEA    Report Status 08/28/2014 FINAL  Final    Medical History: Past Medical History  Diagnosis Date  . Atrial flutter 05/2009    STATUS POST CARDIOVERSION;  s/p RFCA in  2012 (Dr. Caryl Comes)  . Depression   . History of immunotherapy 2014    "had many times before and it worked; had a severe reaction 2013-02-20; they coded me, I had sepsis; almost died" (08-15-13)  . Bladder cancer     'started chemo 07/2013 " (08/15/2013)  . OSA on CPAP   . Pneumonia   . CAD (coronary artery disease)     a. NSTEMI 07/2013:  LHC (08/03/13): mLAD 95% assoc w/ thrombus, dLAD 50%, pRCA 80%, mRCA 50%, normal LVF. => PCI:  Rebel (4 x 12 mm) BMS to the mLAD and Rebel (4 x 15 mm) BMS to the pRCA.  . Osteoarthritis   . Dyslipidemia     Lipitor started at time of NSTEMI 07/2013    Medications:  Scheduled:  . aspirin  81 mg Oral Daily  . atorvastatin  40 mg Oral q1800  . carvedilol  6.25 mg Oral BID WC  . ceFEPime (MAXIPIME) IV  1 g Intravenous Q8H  . ferrous sulfate  325 mg Oral BID WC  . oseltamivir  75 mg Oral BID  . predniSONE  20 mg Oral Q breakfast  . sertraline  150 mg Oral Daily  . [START ON 08/30/2014] vancomycin  1,250 mg Intravenous Q24H  . vancomycin  2,000 mg Intravenous Once  . venlafaxine XR  150 mg Oral Daily   Infusions:  . sodium chloride  Assessment:  79 yr male with fever, chills and altered mental status.  H/O Aflutter, bladder cancer   Chest Xray shows possible pneumonia  CrCl (n) = 54 ml/min  Tmax = 102.2 F  Blood, urine, sputum, CSF cultures ordered  Pharmacy consulted to dose vancomycin for pneumonia.  Asked to adjust other antibiotics as needed for renal function.  Tamiflu x 5 days also initiated  Pharmacy asked to dose acyclovir for possible encephalitis - use IBW for dosing of acyclovir  Goal of Therapy:  Vancomycin trough level 15-20 mcg/ml  Plan:  Measure antibiotic drug levels at steady state Follow up culture results  Continue Cefepime 1gm IV q8h as ordered by MD Vancomycin 2000mg  IV x 1 then 1250mg  IV q24h Acyclovir 700mg  IV q8h  Everette Rank, PharmD 08/29/2014,1:06 AM

## 2014-08-29 NOTE — Consult Note (Signed)
[facility]    Date of Admission:  08/28/2014   Day 1 vancomycin        Day 1 cefepime        Day 1 acyclovir        Day 1 oseltamivir     Reason for Consult: Fever and confusion    Referring Physician:  Dr. Leisa Lenz  Principal Problem:   Fever Active Problems:   Acute encephalopathy   Metastasis from bladder cancer   Drug-induced skin rash   Sleep apnea   Coronary atherosclerosis of native coronary artery   Thrombocytopenia   Normocytic anemia   Obstructive sleep apnea   Cutaneous lupus erythematosus   Steroid-induced hyperglycemia   . acyclovir  700 mg Intravenous Q8H  . aspirin  81 mg Oral Daily  . atorvastatin  40 mg Oral q1800  . carvedilol  6.25 mg Oral BID WC  . ceFEPime (MAXIPIME) IV  1 g Intravenous Q8H  . ferrous sulfate  325 mg Oral BID WC  . predniSONE  20 mg Oral Q breakfast  . sertraline  150 mg Oral Daily  . [START ON 08/30/2014] vancomycin  1,250 mg Intravenous Q24H  . venlafaxine XR  150 mg Oral Daily    Recommendations: 1. Continue vancomycin and cefepime pending blood culture results 2. Discontinue acyclovir and oseltamivir 3. Discontinue droplet precautions 4. Acetaminophen as needed for fever   Assessment: Source of his fever is unclear. He does not have any evidence of meningoencephalitis. Of course, there is always concern for Port-A-Cath infection. I will continue empiric vancomycin and cefepime. He does not need to continue acyclovir for oseltamivir and no longer needs droplet precautions. I will follow-up tomorrow.    HPI: James [name] is a 79 y.o. male with a nearly 20 year history of bladder cancer. He received BCG installations many years ago and it sounds like he was treated for possible systemic BCG infection in 2014. He then started systemic chemotherapy at [facility] this offered some benefit for his metastatic disease but he had trouble tolerating chemotherapy  due to cytopenias and renal insufficiency. He was referred to [facility] last year and started on a chemotherapy trial. He was randomized to receive pembrolizumab and ACP-[id number]. He developed a severe rash that was felt to be due to pembrolizumab. This was stopped late last November. He states that there was consideration for sending him to the burn unit because of the severe rash. He was started on high-dose prednisone 60 mg daily and has been slowly tapered down to 20 mg daily. He also developed superimposed impetiginous infection and was treated with doxycycline in December. He states that his rash has been slowly improving and he was actually feeling better. He has had more energy recently. He has gained more weight since starting the prednisone.  His daughter, Cloyde Reams, has been visiting from out of town. He states that he has been more active since she has been here. They have been cleaning his house. Two days ago they went out for lunch and dinner. At dinner, while eating [place] food (beef and chicken curry), he became flushed and began sweating. The following morning he felt much weaker and started having rigors. He took a nap. After he woke he was confused and very fatigued. He was brought to the hospital where his temperature was noted to be 102.2. Head CT scan and chest x-ray  did not reveal any acute abnormalities. Urinalysis showed too numerous to count red blood cells but no increased white blood cells. He underwent lumbar puncture. It was a traumatic tap with slightly elevated total protein but normal white count. CSF Gram stain was negative. Blood cultures were obtained and he was started on vancomycin, cefepime, acyclovir and oseltamivir. He continued to feel poorly this morning and had more fever and rigors but is feeling significantly better this afternoon. His daughter states that he has improved dramatically over the past 4 hours.   Review of Systems: Constitutional: positive  for chills, fatigue, fevers and sweats, negative for anorexia and weight loss Eyes: negative Ears, nose, mouth, throat, and face: negative Respiratory: positive for mild dry cough after being placed on oxygen last night, negative for dyspnea on exertion, pleurisy/chest pain, sputum and wheezing Cardiovascular: negative Gastrointestinal: negative Genitourinary:negative  Past Medical History  Diagnosis Date  . Atrial flutter 05/2009    STATUS POST CARDIOVERSION;  s/p RFCA in 2012 (Dr. Caryl Comes)  . Depression   . History of immunotherapy 2014    "had many times before and it worked; had a severe reaction 2013/02/13; they coded me, I had sepsis; almost died" (August 08, 2013)  . Bladder cancer     'started chemo 07/2013 " (08/08/13)  . OSA on CPAP   . Pneumonia   . CAD (coronary artery disease)     a. NSTEMI 07/2013:  LHC (08/03/13): mLAD 95% assoc w/ thrombus, dLAD 50%, pRCA 80%, mRCA 50%, normal LVF. => PCI:  Rebel (4 x 12 mm) BMS to the mLAD and Rebel (4 x 15 mm) BMS to the pRCA.  . Osteoarthritis   . Dyslipidemia     Lipitor started at time of NSTEMI 07/2013    History  Substance Use Topics  . Smoking status: Former Smoker -- 1.50 packs/day for 15 years    Types: Cigarettes  . Smokeless tobacco: Never Used     Comment: Aug 08, 2013 "quit smoking in 1984"  . Alcohol Use: Yes     Comment: 08-08-2013 "hx of abuse; clean since 05/29/1981"    Family History  Problem Relation Age of Onset  . Heart failure Father   . Stroke Father   . Kidney failure Mother   . Coronary artery disease     Allergies  Allergen Reactions  . Morphine Nausea Only    OK if given slowly.  . Other Other (See Comments)    Patient states when he takes flomax and protonix together makes him break out in a rash  . Adhesive [Tape] Itching and Rash    Blisters, tears skin  . Bupropion Hives and Rash  . Flomax [Tamsulosin Hcl] Rash    When taken with protonix only patient can take this alone  . Pantoprazole Sodium  Rash    When taken with flomax only patient can take this alone  . Tamsulosin Rash    When taken with protonix only patient can take this alone    OBJECTIVE: Blood pressure 121/61, pulse 88, temperature 101.5 F (38.6 C), temperature source Oral, resp. rate 18, height 5\' 9"  (1.753 m), weight 255 lb 11.7 oz (116 kg), SpO2 93 %.   General: His face remains slightly flushed. He speaks slowly but clearly and is not confused Skin: He is somewhat diaphoretic but has no acute rash. There are healing scars from his recent chemotherapy induced rash Lungs: Clear Cor: Regular S1 and S2 with no murmurs Chest: Right anterior chest Port-A-Cath site appears normal although  there is sweat collected under his dressing Abdomen: Obese, soft and nontender with no palpable masses. No suprapubic or CVA tenderness Extremities: 2+ pitting edema in his lower legs  Lab Results Lab Results  Component Value Date   WBC 5.6 08/29/2014   HGB 11.7* 08/29/2014   HCT 35.8* 08/29/2014   MCV 101.7* 08/29/2014   PLT 108* 08/29/2014    Lab Results  Component Value Date   CREATININE 1.27 08/29/2014   BUN 22 08/29/2014   NA 134* 08/29/2014   K 3.9 08/29/2014   CL 100 08/29/2014   CO2 27 08/29/2014    Lab Results  Component Value Date   ALT 26 08/29/2014   AST 24 08/29/2014   ALKPHOS 39 08/29/2014   BILITOT 0.7 08/29/2014     Microbiology: Recent Results (from the past 240 hour(s))  Culture, blood (routine x 2)     Status: None (Preliminary result)   Collection Time: 08/28/14  5:30 PM  Result Value Ref Range Status   Specimen Description BLOOD LEFT ANTECUBITAL  Final   Special Requests BOTTLES DRAWN AEROBIC AND ANAEROBIC 3CC  Final   Culture   Final           BLOOD CULTURE RECEIVED NO GROWTH TO DATE CULTURE WILL BE HELD FOR 5 DAYS BEFORE ISSUING A FINAL NEGATIVE REPORT Performed at [facility]    Report Status PENDING  Incomplete  Culture, blood (routine x 2)     Status: None (Preliminary  result)   Collection Time: 08/28/14  5:31 PM  Result Value Ref Range Status   Specimen Description BLOOD LEFT FOREARM  Final   Special Requests BOTTLES DRAWN AEROBIC ONLY 4CC  Final   Culture   Final           BLOOD CULTURE RECEIVED NO GROWTH TO DATE CULTURE WILL BE HELD FOR 5 DAYS BEFORE ISSUING A FINAL NEGATIVE REPORT Performed at [facility]    Report Status PENDING  Incomplete  CSF culture     Status: None (Preliminary result)   Collection Time: 08/28/14  8:19 PM  Result Value Ref Range Status   Specimen Description CSF  Final   Special Requests NONE  Final   Gram Stain   Final    NO WBC SEEN NO ORGANISMS SEEN Gram Stain Report Called to,Read Back By and Verified With: Gram Stain Report Called to,Read Back By and Verified With: NURSE TRAVIA BENJAMIN 08/29/14 2:25AM [place] Performed at [facility]    Culture NO GROWTH Performed at [facility]   Final   Report Status PENDING  Incomplete  Gram stain - STAT with CSF culture     Status: None   Collection Time: 08/28/14  8:19 PM  Result Value Ref Range Status   Specimen Description CSF  Final   Special Requests NONE  Final   Gram Stain   Final    NO WBC SEEN NO ORGANISMS SEEN CYTOSPIN PREPARATION NOTIFIED T.NIELSON,RN AT 2203 ON 08/28/14 BY W.SHEA    Report Status 08/28/2014 FINAL  Final  Culture, sputum-assessment     Status: None   Collection Time: 08/29/14 11:22 AM  Result Value Ref Range Status   Specimen Description SPUTUM  Final   Special Requests NONE  Final   Sputum evaluation   Final    MICROSCOPIC FINDINGS SUGGEST THAT THIS SPECIMEN IS NOT REPRESENTATIVE OF LOWER RESPIRATORY SECRETIONS. PLEASE RECOLLECT. INFORMED AVALR,E. RN @1237  ON 08/29/14 BY MCCOY,N.   Report Status 08/29/2014 FINAL  Final  Michel Bickers, MD [facility] [place] [phone] pager   [phone] cell 08/29/2014, 4:31 PM

## 2014-08-29 NOTE — Plan of Care (Signed)
Problem: Consults Goal: Nutrition Consult-if indicated Outcome: Progressing Pt able to tolerate diet. Denies N/V/D. Denies wt loss.  Problem: Phase I Progression Outcomes Goal: Pain controlled with appropriate interventions Outcome: Progressing Pt denies pain    Goal: OOB as tolerated unless otherwise ordered OOB with assistance x 1. Encouraged patient to call. Phones numbers on white board and call light with in reach.  Goal: Voiding-avoid urinary catheter unless indicated Outcome: Completed/Met Date Met:  08/29/14 Pt voiding. Uses urinal independently.

## 2014-08-29 NOTE — H&P (Signed)
[facility] History and Physical  James [name] UXN:[id number] DOB: May 22, 1936 DOA: 08/28/2014  Referring physician: ER physician. PCP: Horatio Pel, MD   Chief Complaint: Confusion and fever.  HPI: James [name] is a 79 y.o. male with history of CAD status post stenting, atrial flutter status post ablation, metastatic bladder carcinoma, OSA on C Pap, dyslipidemia presents to the ER because of weakness and confusion and fever. As per patient's daughter was at the bedside patient had some [place] on January 17 night. Feels like the patient started developing flushing of the face with fingerlike sensation. Yesterday morning January 18 patient became more confused and weaker and febrile with chills and temperature around 102F. Patient was brought to the ER. Initially patient was appearing confused with facial flushing. CT head did not show anything acute. Lumbar puncture was done which shows CSF WBC of 1 with RBCs and protein. Chest x-ray was showing left-sided atelectasis with possible pneumonia. Patient had mild shortness of breath but denies any cough. ER physician had increased patient on Tamiflu and admitted for further management. Patient denies any chest pain nausea vomiting abdominal pain diarrhea. Patient has been treated for metastatic bladder carcinoma at [facility] as per the patient and has been on a trial drug and in December last month patient developed some rash following which patient's medication was discontinued and was placed on IV steroid and patient present he is on a steroid tapering dose. Patient also at that time was briefly placed on doxycycline for 1 week. Patient still has rash all over the body and the face appears flushed. On exam patient's neck is not rigid. And patient presently alert awake oriented to time place and person and moves all extremities.   Review of Systems: As presented in the history of presenting illness, rest negative.  Past Medical History   Diagnosis Date  . Atrial flutter 05/2009    STATUS POST CARDIOVERSION;  s/p RFCA in 2012 (Dr. Caryl Comes)  . Depression   . History of immunotherapy 2014    "had many times before and it worked; had a severe reaction 02-14-2013; they coded me, I had sepsis; almost died" (08-09-2013)  . Bladder cancer     'started chemo 07/2013 " (Aug 09, 2013)  . OSA on CPAP   . Pneumonia   . CAD (coronary artery disease)     a. NSTEMI 07/2013:  LHC (08/03/13): mLAD 95% assoc w/ thrombus, dLAD 50%, pRCA 80%, mRCA 50%, normal LVF. => PCI:  Rebel (4 x 12 mm) BMS to the mLAD and Rebel (4 x 15 mm) BMS to the pRCA.  . Osteoarthritis   . Dyslipidemia     Lipitor started at time of NSTEMI 07/2013   Past Surgical History  Procedure Laterality Date  . Carpal tunnel release Bilateral ~ 2012  . Atrial flutter ablation  2012  . Cardioversion  05/2009  . Transurethral resection of bladder tumor      "several" (August 09, 2013)  . Cystoscopy  4-12/2012    "biopsies; removal of tissue" (08/09/2013)  . Renal mass excision  03/2013    "I've had 2 of these; percutaneous" (Aug 09, 2013)  . Lymph node biopsy  06/2013    "in the area of the kidney" (08-09-2013)  . Tonsillectomy and adenoidectomy  1940's  . Left heart catheterization with coronary angiogram N/A 08/03/2013    Procedure: LEFT HEART CATHETERIZATION WITH CORONARY ANGIOGRAM;  Surgeon: Ramond Dial, MD;  Location: [facility] CATH LAB;  Service: Cardiovascular;  Laterality: N/A;  . Percutaneous  coronary stent intervention (pci-s)  08/03/2013    Procedure: PERCUTANEOUS CORONARY STENT INTERVENTION (PCI-S);  Surgeon: Ramond Dial, MD;  Location: [facility] CATH LAB;  Service: Cardiovascular;;   Social History:  reports that he has quit smoking. His smoking use included Cigarettes. He has a 22.5 pack-year smoking history. He has never used smokeless tobacco. He reports that he drinks alcohol. He reports that he does not use illicit drugs. Where does patient live home. Can patient  participate in ADLs? Yes.  Allergies  Allergen Reactions  . Morphine Nausea Only    OK if given slowly.  . Other Other (See Comments)    Patient states when he takes flomax and protonix together makes him break out in a rash  . Adhesive [Tape] Itching and Rash    Blisters, tears skin  . Bupropion Hives and Rash  . Flomax [Tamsulosin Hcl] Rash    When taken with protonix only patient can take this alone  . Pantoprazole Sodium Rash    When taken with flomax only patient can take this alone  . Tamsulosin Rash    When taken with protonix only patient can take this alone    Family History:  Family History  Problem Relation Age of Onset  . Heart failure Father   . Stroke Father   . Kidney failure Mother   . Coronary artery disease        Prior to Admission medications   Medication Sig Start Date End Date Taking? Authorizing Provider  acetaminophen (TYLENOL) 500 MG tablet Take 500 mg by mouth every 6 (six) hours as needed for fever.   Yes Historical Provider, MD  antiseptic oral rinse (BIOTENE) LIQD 15 mLs by Mouth Rinse route as needed for dry mouth.   Yes Historical Provider, MD  aspirin 81 MG tablet Take 81 mg by mouth daily.   Yes Historical Provider, MD  atorvastatin (LIPITOR) 40 MG tablet TAKE 1 TABLET (40 MG TOTAL) BY MOUTH DAILY. 07/19/14  Yes Thayer Headings, MD  carvedilol (COREG) 6.25 MG tablet Take 1 tablet (6.25 mg total) by mouth 2 (two) times daily. 10/26/13  Yes Thayer Headings, MD  Dexamethasone (DECADRON PO) Take 1 tablet by mouth daily as needed (after chemo treatment).   Yes Historical Provider, MD  ferrous sulfate 325 (65 FE) MG tablet Take 325 mg by mouth 2 (two) times daily with a meal.   Yes Historical Provider, MD  Garlic 409 MG CAPS Take 300 mg by mouth daily.   Yes Historical Provider, MD  lidocaine-prilocaine (EMLA) cream Apply topically as needed for port   Yes Historical Provider, MD  loperamide (IMODIUM A-D) 2 MG tablet Take 2 mg by mouth 3 (three) times  daily as needed for diarrhea or loose stools.   Yes Historical Provider, MD  MULTIPLE VITAMIN PO Take by mouth daily.   Yes Historical Provider, MD  Nettle, Urtica Dioica, (NETTLE LEAF PO) Take 300 mg by mouth daily.   Yes Historical Provider, MD  nitroGLYCERIN (NITROSTAT) 0.4 MG SL tablet Place 0.4 mg under the tongue every 5 (five) minutes as needed for chest pain.    Yes Historical Provider, MD  Omega-3 Fatty Acids (OMEGA 3 PO) Take 1 capsule by mouth 2 (two) times daily. [phone] mg   Yes Historical Provider, MD  OVER THE COUNTER MEDICATION Cell Builder Inistol daily   Yes Historical Provider, MD  predniSONE (DELTASONE) 20 MG tablet Take 20 mg by mouth daily with breakfast.    Yes  Historical Provider, MD  Probiotic Product (PROBIOTIC & ACIDOPHILUS EX ST PO) Take by mouth daily.   Yes Historical Provider, MD  sertraline (ZOLOFT) 100 MG tablet Take 150 mg by mouth daily.    Yes Historical Provider, MD  triamcinolone cream (KENALOG) 0.1 % Apply 1 application topically daily as needed (for itching).   Yes Historical Provider, MD  venlafaxine XR (EFFEXOR-XR) 150 MG 24 hr capsule Take 150 mg by mouth daily.    Yes Historical Provider, MD  OVER THE COUNTER MEDICATION Broccamax 380mg  daily    Historical Provider, MD  OVER THE COUNTER MEDICATION [place] 1100mg  daily    Historical Provider, MD    Physical Exam: Filed Vitals:   08/28/14 2200 08/28/14 2230 08/28/14 2303 08/28/14 2341  BP: 126/64 123/65 126/88 112/62  Pulse: 71 71 68 71  Temp:   98 F (36.7 C) 97.7 F (36.5 C)  TempSrc:   Oral Oral  Resp: 15 13 15 18   Height:    5\' 9"  (1.753 m)  Weight:    116 kg (255 lb 11.7 oz)  SpO2: 98% 98% 100% 98%     General:  Well-developed and nourished.  Eyes: Anicteric no pallor.  ENT: No discharge from the ears eyes nose or mouth.  Neck: No mass felt no neck rigidity.  Cardiovascular: S1 and S2 heard.  Respiratory: No rhonchi or crepitations.  Abdomen: Soft nontender bowel sounds  present.  Skin: Facial flushing and rash all over the body which patient states is chronic.  Musculoskeletal: No edema. Patient with a stocking at this time.  Psychiatric: Appears normal at this time.  Neurologic: Alert awake oriented to time place and person. Moves all extremities 5 x 5.  Labs on Admission:  Basic Metabolic Panel:  Recent Labs Lab 08/28/14 1729  NA 131*  K 4.1  CL 97  CO2 24  GLUCOSE 119*  BUN 26*  CREATININE 1.18  CALCIUM 8.2*   Liver Function Tests:  Recent Labs Lab 08/28/14 1729  AST 28  ALT 29  ALKPHOS 46  BILITOT 0.9  PROT 6.3  ALBUMIN 3.4*   No results for input(s): LIPASE, AMYLASE in the last 168 hours. No results for input(s): AMMONIA in the last 168 hours. CBC:  Recent Labs Lab 08/28/14 1729  WBC 6.6  NEUTROABS 5.4  HGB 12.7*  HCT 37.8*  MCV 99.2  PLT 116*   Cardiac Enzymes: No results for input(s): CKTOTAL, CKMB, CKMBINDEX, TROPONINI in the last 168 hours.  BNP (last 3 results) No results for input(s): PROBNP in the last 8760 hours. CBG: No results for input(s): GLUCAP in the last 168 hours.  Radiological Exams on Admission: Ct Head Wo Contrast  08/28/2014   CLINICAL DATA:  Confusion beginning this afternoon. Fever. History of bladder carcinoma.  EXAM: CT HEAD WITHOUT CONTRAST  TECHNIQUE: Contiguous axial images were obtained from the base of the skull through the vertex without intravenous contrast.  COMPARISON:  01/17/2011  FINDINGS: Ventricles are normal in configuration. There is age related mild ventricular and sulcal enlargement. No hydrocephalus.  No parenchymal masses or mass effect. There is no evidence of a cortical infarct.  No extra-axial masses or abnormal fluid collections.  There is no intracranial hemorrhage.  Sinuses and mastoid air cells are clear.  No skull lesion.  IMPRESSION: 1. No acute intracranial abnormalities.  Age related volume loss.   Electronically Signed   By: Lajean Manes M.D.   On: 08/28/2014  19:16   Dg Chest [facility]  08/28/2014   CLINICAL DATA:  . Patient states that he had "a chill" when he laid down today for a nap and was not easily aroused when by his daughters who states that he was confused. Answering all questions appropriate. Grip strength and leg strength equal bilaterally. Patient is SOB while speaking. Just taken off of a cancer trial for bladder ca, hx pna, cad, ex-smoker  EXAM: PORTABLE CHEST - 1 VIEW  COMPARISON:  09/09/2013  FINDINGS: There is lung base patchy airspace and interstitial opacity more evident on the left. This is similar to the prior study, may all reflect chronic scarring and atelectasis. Pneumonia is possible.  No pulmonary edema.  No pleural effusion or pneumothorax.  Cardiac silhouette is normal size. No mediastinal or hilar masses. Right anterior chest wall Port-A-Cath is stable with its tip just above the caval atrial junction.  Bony thorax is demineralized but grossly intact.  IMPRESSION: Lung base opacity, left greater than right, similar to the prior exam. This may all be chronic due to atelectasis/scarring. Pneumonia is possible.  No other evidence of an acute abnormality.  No pulmonary edema.   Electronically Signed   By: Lajean Manes M.D.   On: 08/28/2014 18:04     Assessment/Plan Principal Problem:   Acute encephalopathy Active Problems:   Sleep apnea   Fever   Coronary atherosclerosis of native coronary artery   Thrombocytopenia   1. Fever - cause not clear. Follow blood cultures and pending CSF labs including HSV PCR. Patient's chest x-ray shows chronic changes but there could also be possible pneumonia for which at this time I have placed patient on vancomycin and cefepime to cover for healthcare associated pneumonia since patient has received infusion last month and also was on doxycycline. We'll also keep patient on acyclovir to cover for viral encephalitis until it is free labs are available. Patient has been already started on  Tamiflu and pending influenza PCR. May consult infectious diseases name for further recommendations. Patient also has skin flushing and history of skin lesions with lupus. May check sedimentation rate for any flareup. 2. Acute encephalopathy - probably related to fever and has largely resolved and improved at this time. Closely observe. 3. History of skin rash and skin lupus - patient is on prednisone which will be continued. Check sedimentation rate. 4. CAD - denies any chest pain. Continue home medications. 5. Sleep apnea on C Pap. 6. Thrombocytopenia - closely follow CBC for any further worsening. 7. Hyperlipidemia - continue present medications.   DVT Prophylaxis SCDs for now as patient has had lumbar puncture and if platelets don't further worsen and patient remained stable may start Lovenox tomorrow.  Code Status: Full code.  Family Communication: Patient's daughter and wife.  Disposition Plan: Admit to inpatient.    Destani Wamser N. [facility] Pager [phone].  If 7PM-7AM, please contact night-coverage [email or web address] Password [email or web address] 08/29/2014, 12:56 AM

## 2014-08-29 NOTE — Progress Notes (Signed)
PT Cancellation Note  Patient Details Name: James [name] MRN: [id number] DOB: 12-26-1935   Cancelled Treatment:    Reason Eval/Treat Not Completed: Medical issues which prohibited therapy (pt having rigors after LP.  Will follow. )   Philomena Doheny 08/29/2014, 11:12 AM [phone]

## 2014-08-29 NOTE — Progress Notes (Addendum)
Patient ID: James [name], male   DOB: June 03, 1936, 79 y.o.   MRN: [id number] [facility] PROGRESS NOTE  James [name] OVZ:[id number] DOB: [birth year] DOA: 09-08-14 PCP: Horatio Pel, MD  Brief narrative:   Addendum to admission note 08/29/2014  79 y.o. male with history of CAD status post stenting, atrial flutter status post ablation, metastatic bladder carcinoma, OSA on CPAP, dyslipidemia who presented to [facility] ED with sudden onset of fever, confusion. Patient apparently ate [place] the night prior to the admission. T max at home 102 F. no complaints of cough, no abdominal pain, nausea or vomiting. No reports of diarrhea. Patient has been treated for metastatic bladder carcinoma at [facility] and per patient has been on a trial drug in December 2015 at which time patient developed some rash. Then this medication was discontinued and he was placed on IV steroid and at present he is on a steroid tapering dose. He also took doxycycline for 1 week at that time. CT head on the admission did not show acute intracranial abnormalities. Chest x-ray showed lung base opacity, left greater than the right similar to prior exam which could be chronic versus possible pneumonia. Lumbar puncture was done in ED. Patient was started  on vancomycin, cefepime, acyclovir and Tamiflu.   Assessment/Plan:    Principal Problem:   Acute encephalopathy / fever - Obvious concern for herpes encephalitis however mental status is at baseline this morning. Patient was started empirically on acyclovir, Tamiflu, vancomycin and cefepime. - will stop tamiflu since influenza PCR negative -  CSF culture pending. Blood culture to date is negative.  - Appreciate infectious disease recommendations.     DVT Prophylaxis   SCDs bilaterally  Code Status: Full.  Family Communication:  plan of care discussed with the patient Disposition Plan: Home when stable.   IV access:  Peripheral IV  Procedures and diagnostic studies:     Ct Head Wo Contrast 09/08/14   1. No acute intracranial abnormalities.  Age related volume loss.   Electronically Signed   By: Lajean Manes M.D.   On: 09-08-14 19:16   Dg Chest Port 1 View 09/08/2014  Lung base opacity, left greater than right, similar to the prior exam. This may all be chronic due to atelectasis/scarring. Pneumonia is possible.  No other evidence of an acute abnormality.  No pulmonary edema.   Electronically Signed   By: Lajean Manes M.D.   On: 2014-09-08 18:04    Medical Consultants:  Infectious disease, Dr. Michel Bickers  Other Consultants:  None  IAnti-Infectives:   Acyclovir 08/29/2014 --> Tamiflu 08/29/2014 --> 08/29/2014  Vancomycin 08/29/2014 --> Cefepime 08/29/2014 -->   Leisa Lenz, MD  [facility] Pager [phone]  If 7PM-7AM, please contact night-coverage [email or web address] Password [email or web address] 08/29/2014, 11:09 AM   LOS: 1 day    HPI/Subjective: No acute overnight events.  Objective: Filed Vitals:   2014/09/08 2303 08-Sep-2014 2341 08/29/14 0444 08/29/14 0903  BP: 126/88 112/62 111/60   Pulse: 68 71 64   Temp: 98 F (36.7 C) 97.7 F (36.5 C) 97 F (36.1 C) 98 F (36.7 C)  TempSrc: Oral Oral Oral Oral  Resp: 15 18 18    Height:  5\' 9"  (1.753 m)    Weight:  116 kg (255 lb 11.7 oz)    SpO2: 100% 98% 99%     Intake/Output Summary (Last 24 hours) at 08/29/14 1109 Last data filed at 08/29/14 0914  Gross per 24 hour  Intake    600 ml  Output    850 ml  Net   -250 ml    Exam:   General:  Pt is alert, rigors (+)  Cardiovascular: Regular rate and rhythm, S1/S2 (+)  Respiratory: Clear to auscultation bilaterally, no wheezing, no crackles, no rhonchi  Abdomen: Soft, non tender, non distended, bowel sounds present  Extremities: No edema, pulses DP and PT palpable bilaterally  Neuro: Grossly nonfocal  Data Reviewed: Basic Metabolic Panel:  Recent Labs Lab 08/28/14 1729 08/29/14 0135  NA 131* 134*  K 4.1 3.9  CL 97 100  CO2 24  27  GLUCOSE 119* 165*  BUN 26* 22  CREATININE 1.18 1.27  CALCIUM 8.2* 8.1*   Liver Function Tests:  Recent Labs Lab 08/28/14 1729 08/29/14 0135  AST 28 24  ALT 29 26  ALKPHOS 46 39  BILITOT 0.9 0.7  PROT 6.3 5.5*  ALBUMIN 3.4* 2.7*   No results for input(s): LIPASE, AMYLASE in the last 168 hours. No results for input(s): AMMONIA in the last 168 hours. CBC:  Recent Labs Lab 08/28/14 1729 08/29/14 0135  WBC 6.6 5.6  NEUTROABS 5.4 4.7  HGB 12.7* 11.7*  HCT 37.8* 35.8*  MCV 99.2 101.7*  PLT 116* 108*   Cardiac Enzymes:  Recent Labs Lab 08/29/14 0135  TROPONINI <0.03   BNP: Invalid input(s): POCBNP CBG: No results for input(s): GLUCAP in the last 168 hours.  Recent Results (from the past 240 hour(s))  Culture, blood (routine x 2)     Status: None (Preliminary result)   Collection Time: 08/28/14  5:30 PM  Result Value Ref Range Status   Specimen Description BLOOD LEFT ANTECUBITAL  Final   Special Requests BOTTLES DRAWN AEROBIC AND ANAEROBIC 3CC  Final   Culture   Final           BLOOD CULTURE RECEIVED NO GROWTH TO DATE CULTURE WILL BE HELD FOR 5 DAYS BEFORE ISSUING A FINAL NEGATIVE REPORT Performed at [facility]    Report Status PENDING  Incomplete  Culture, blood (routine x 2)     Status: None (Preliminary result)   Collection Time: 08/28/14  5:31 PM  Result Value Ref Range Status   Specimen Description BLOOD LEFT FOREARM  Final   Special Requests BOTTLES DRAWN AEROBIC ONLY 4CC  Final   Culture   Final           BLOOD CULTURE RECEIVED NO GROWTH TO DATE CULTURE WILL BE HELD FOR 5 DAYS BEFORE ISSUING A FINAL NEGATIVE REPORT Performed at [facility]    Report Status PENDING  Incomplete  CSF culture     Status: None (Preliminary result)   Collection Time: 08/28/14  8:19 PM  Result Value Ref Range Status   Specimen Description CSF  Final   Special Requests NONE  Final   Gram Stain   Final    NO WBC SEEN NO ORGANISMS SEEN Gram Stain  Report Called to,Read Back By and Verified With: Gram Stain Report Called to,Read Back By and Verified With: NURSE TRAVIA BENJAMIN 08/29/14 2:25AM [place] Performed at [facility]    Culture NO GROWTH Performed at [facility]   Final   Report Status PENDING  Incomplete  Gram stain - STAT with CSF culture     Status: None   Collection Time: 08/28/14  8:19 PM  Result Value Ref Range Status   Specimen Description CSF  Final   Special Requests NONE  Final   Gram Stain   Final  NO WBC SEEN NO ORGANISMS SEEN CYTOSPIN PREPARATION NOTIFIED T.NIELSON,RN AT 2203 ON 08/28/14 BY W.SHEA    Report Status 08/28/2014 FINAL  Final     Scheduled Meds: . acyclovir  700 mg Intravenous Q8H  . aspirin  81 mg Oral Daily  . atorvastatin  40 mg Oral q1800  . carvedilol  6.25 mg Oral BID WC  . ceFEPime (MAXIPIME) IV  1 g Intravenous Q8H  . ferrous sulfate  325 mg Oral BID WC  . oseltamivir  75 mg Oral BID  . predniSONE  20 mg Oral Q breakfast  . sertraline  150 mg Oral Daily  . [START ON 08/30/2014] vancomycin  1,250 mg Intravenous Q24H  . venlafaxine XR  150 mg Oral Daily   Continuous Infusions: . sodium chloride

## 2014-08-30 DIAGNOSIS — L270 Generalized skin eruption due to drugs and medicaments taken internally: Secondary | ICD-10-CM

## 2014-08-30 DIAGNOSIS — G9340 Encephalopathy, unspecified: Secondary | ICD-10-CM

## 2014-08-30 DIAGNOSIS — I10 Essential (primary) hypertension: Secondary | ICD-10-CM

## 2014-08-30 LAB — HIV ANTIBODY (ROUTINE TESTING W REFLEX): HIV-1/HIV-2 Ab: NONREACTIVE

## 2014-08-30 LAB — LEGIONELLA ANTIGEN, URINE

## 2014-08-30 MED ORDER — SODIUM CHLORIDE 0.9 % IV SOLN
INTRAVENOUS | Status: DC
  Administered 2014-08-30 – 2014-09-02 (×4): via INTRAVENOUS

## 2014-08-30 MED ORDER — TRAMADOL HCL 50 MG PO TABS
50.0000 mg | ORAL_TABLET | Freq: Four times a day (QID) | ORAL | Status: DC | PRN
  Administered 2014-08-30 – 2014-08-31 (×3): 50 mg via ORAL
  Filled 2014-08-30 (×3): qty 1

## 2014-08-30 NOTE — Progress Notes (Signed)
Patient ID: James [name], male   DOB: [birth year], 79 y.o.   MRN: [id number] [facility] PROGRESS NOTE  James [name] W[facility]:[id number] DOB: 08/11/1936 DOA: 08/28/2014 PCP: Horatio Pel, MD  Brief narrative:    79 y.o. male with history of CAD status post stenting, atrial flutter status post ablation, metastatic bladder carcinoma, OSA on CPAP, dyslipidemia who presented to [facility] ED with sudden onset of fever, confusion. Patient apparently ate [place] the night prior to the admission. T max at home 102 F. no complaints of cough, no abdominal pain, nausea or vomiting. No reports of diarrhea. Patient has been treated for metastatic bladder carcinoma at [facility] and per patient has been on a trial drug in December 2015 at which time patient developed some rash. Then this medication was discontinued and he was placed on IV steroid and at present he is on a steroid tapering dose. He also took doxycycline for 1 week at that time. CT head on the admission did not show acute intracranial abnormalities. Chest x-ray showed lung base opacity, left greater than the right similar to prior exam which could be chronic versus possible pneumonia. Lumbar puncture was done in ED. Patient was started on vancomycin, cefepime, acyclovir and Tamiflu.   Assessment/Plan:     Principal Problem:  Acute encephalopathy / fever - no obvious source of fever identified at his time. CSF culture shows no growth so far. HIV ab is non reactive. Urine culture shows no growth. HSV by PCR not detected. Influenza is negative. Blood cultures so far show no growth. - stopped tamiflu and acyclovir - continue vanco and cefepime - appreciate ID assistance.  Active Problems: OSA on CPAP - stable  H/O CAD - stable, no complaints of chest pain - continue coreg 6.25 mg PO BID - continue aspirin daily  Anemia of chronic disease - due to history of malignancy - hemoglobin stable - no current indications for transfusion    Rash - on tapering prednisone dose  Depression - continue Zoloft and as needed ativan   Metastatic bladder cancer - follows at [facility]  DVT Prophylaxis   SCDs bilaterally  Code Status: Full.  Family Communication: plan of care discussed with the patient and his family at the bedside  Disposition Plan: Home when stable.    IV access:  Peripheral IV  Procedures and diagnostic studies:    Ct Head Wo Contrast 08/28/2014 1. No acute intracranial abnormalities. Age related volume loss. Electronically Signed By: Lajean Manes M.D. On: 08/28/2014 19:16   Dg Chest Port 1 View 08/28/2014 Lung base opacity, left greater than right, similar to the prior exam. This may all be chronic due to atelectasis/scarring. Pneumonia is possible. No other evidence of an acute abnormality. No pulmonary edema. Electronically Signed By: Lajean Manes M.D. On: 08/28/2014 18:04   Medical Consultants:  Infectious disease, Dr. Michel Bickers  Other Consultants:  Physical therapy  IAnti-Infectives:   Acyclovir 08/29/2014 --> 08/29/2014 Tamiflu 08/29/2014 --> 08/29/2014  Vancomycin 08/29/2014 --> Cefepime 08/29/2014 -->   Leisa Lenz, MD  [facility] Pager [phone]  If 7PM-7AM, please contact night-coverage [email or web address] Password [email or web address] 08/30/2014, 4:18 PM   LOS: 2 days    HPI/Subjective: No acute overnight events.  Objective: Filed Vitals:   08/30/14 0409 08/30/14 0819 08/30/14 1023 08/30/14 1330  BP: 116/59 112/66 136/74 137/67  Pulse: 70  74 72  Temp: 98.5 F (36.9 C) 98.9 F (37.2 C)  99.1 F (37.3 C)  TempSrc: Oral   Oral  Resp: 16  18  Height:      Weight:      SpO2: 99%  91% 93%    Intake/Output Summary (Last 24 hours) at 08/30/14 1618 Last data filed at 08/30/14 1353  Gross per 24 hour  Intake    590 ml  Output   1400 ml  Net   -810 ml    Exam:   General:  Pt is not in acute distress  Cardiovascular: Regular rate and rhythm, S1/S2  (+)  Respiratory: no wheezing, no crackles, no rhonchi  Abdomen: Soft, non tender, non distended, bowel sounds present  Extremities: No edema, pulses DP and PT palpable bilaterally  Neuro: Grossly nonfocal  Data Reviewed: Basic Metabolic Panel:  Recent Labs Lab 08/28/14 1729 08/29/14 0135  NA 131* 134*  K 4.1 3.9  CL 97 100  CO2 24 27  GLUCOSE 119* 165*  BUN 26* 22  CREATININE 1.18 1.27  CALCIUM 8.2* 8.1*   Liver Function Tests:  Recent Labs Lab 08/28/14 1729 08/29/14 0135  AST 28 24  ALT 29 26  ALKPHOS 46 39  BILITOT 0.9 0.7  PROT 6.3 5.5*  ALBUMIN 3.4* 2.7*   No results for input(s): LIPASE, AMYLASE in the last 168 hours. No results for input(s): AMMONIA in the last 168 hours. CBC:  Recent Labs Lab 08/28/14 1729 08/29/14 0135  WBC 6.6 5.6  NEUTROABS 5.4 4.7  HGB 12.7* 11.7*  HCT 37.8* 35.8*  MCV 99.2 101.7*  PLT 116* 108*   Cardiac Enzymes:  Recent Labs Lab 08/29/14 0135  TROPONINI <0.03   BNP: Invalid input(s): POCBNP CBG: No results for input(s): GLUCAP in the last 168 hours.  Recent Results (from the past 240 hour(s))  Culture, blood (routine x 2)     Status: None (Preliminary result)   Collection Time: 08/28/14  5:30 PM  Result Value Ref Range Status   Specimen Description BLOOD LEFT ANTECUBITAL  Final   Special Requests BOTTLES DRAWN AEROBIC AND ANAEROBIC 3CC  Final   Culture   Final           BLOOD CULTURE RECEIVED NO GROWTH TO DATE     Report Status PENDING  Incomplete  Culture, blood (routine x 2)     Status: None (Preliminary result)   Collection Time: 08/28/14  5:31 PM  Result Value Ref Range Status   Specimen Description BLOOD LEFT FOREARM  Final   Special Requests BOTTLES DRAWN AEROBIC ONLY 4CC  Final   Culture   Final           BLOOD CULTURE RECEIVED NO GROWTH TO DATE    Report Status PENDING  Incomplete  Urine culture     Status: None   Collection Time: 08/28/14  6:23 PM  Result Value Ref Range Status   Specimen  Description URINE, CLEAN CATCH  Final   Special Requests NONE  Final   Culture NO GROWTH   Final   Report Status 08/29/2014 FINAL  Final  CSF culture     Status: None (Preliminary result)   Collection Time: 08/28/14  8:19 PM  Result Value Ref Range Status   Specimen Description CSF  Final   Special Requests NONE  Final   Gram Stain   Final    NO WBC SEEN NO ORGANISMS SEEN   Culture   Final    NO GROWTH 1 DAY    Report Status PENDING  Incomplete  Gram stain - STAT with CSF culture     Status: None  Collection Time: 08/28/14  8:19 PM  Result Value Ref Range Status   Specimen Description CSF  Final   Special Requests NONE  Final   Gram Stain   Final    NO WBC SEEN NO ORGANISMS SEEN CYTOSPIN PREPARATION NOTIFIED T.NIELSON,RN AT 2203 ON 08/28/14 BY W.SHEA    Report Status 08/28/2014 FINAL  Final  Culture, sputum-assessment     Status: None   Collection Time: 08/29/14 11:22 AM  Result Value Ref Range Status   Specimen Description SPUTUM  Final   Special Requests NONE  Final   Sputum evaluation   Final    MICROSCOPIC FINDINGS SUGGEST THAT THIS SPECIMEN IS NOT REPRESENTATIVE OF LOWER RESPIRATORY SECRETIONS. PLEASE RECOLLECT. INFORMED AVALR,E. RN @1237  ON 08/29/14 BY MCCOY,N.   Report Status 08/29/2014 FINAL  Final     Scheduled Meds: . aspirin  81 mg Oral Daily  . atorvastatin  40 mg Oral q1800  . carvedilol  6.25 mg Oral BID WC  . ceFEPime (MAXIPIME) IV  1 g Intravenous Q8H  . ferrous sulfate  325 mg Oral BID WC  . predniSONE  20 mg Oral Q breakfast  . sertraline  150 mg Oral Daily  . vancomycin  1,250 mg Intravenous Q24H  . venlafaxine XR  150 mg Oral Daily   Continuous Infusions: . sodium chloride 50 mL/hr at 08/30/14 1353

## 2014-08-30 NOTE — Progress Notes (Signed)
Pt stated that he doesn't require any assistance from RT with home CPAP.  RT to monitor and assess as needed.

## 2014-08-30 NOTE — Progress Notes (Signed)
Physical Therapy Treatment Patient Details Name: James [name] MRN: [id number] DOB: 1936-08-11 Today's Date: 08/30/2014    History of Present Illness James [name] is a 79 y.o. male with history of CAD status post stenting, atrial flutter status post ablation, metastatic bladder carcinoma, OSA on C Pap, dyslipidemia presents to the ER because of weakness and confusion and fever.    PT Comments    *Instructed pt in use of 4 wheeled RW this afternoon. He walked 400' with it, no LOB, decreased SOB. Rollator recommended for home. **  Follow Up Recommendations  Home health PT     Equipment Recommendations  Other (comment) (4 wheeled RW)    Recommendations for Other Services       Precautions / Restrictions Precautions Precautions: Fall Restrictions Weight Bearing Restrictions: No    Mobility  Bed Mobility Overal bed mobility: Modified Independent             General bed mobility comments: used rail, increased time, instructed pt in log roll  Transfers Overall transfer level: Needs assistance Equipment used: 4-wheeled walker Transfers: Sit to/from Stand Sit to Stand: Supervision         General transfer comment: cues for hand placement  Ambulation/Gait Ambulation/Gait assistance: Supervision Ambulation Distance (Feet): 400 Feet Assistive device: 4-wheeled walker (held onto rail in hall) Gait Pattern/deviations: WFL(Within Functional Limits)   Gait velocity interpretation: Below normal speed for age/gender General Gait Details: noted pt has less SOB using rollator as compared to using handrail earlier this morning   Stairs            Wheelchair Mobility    Modified Rankin (Stroke Patients Only)       Balance Overall balance assessment: Modified Independent                                  Cognition Arousal/Alertness: Awake/alert Behavior During Therapy: WFL for tasks assessed/performed Overall Cognitive Status: Within Functional  Limits for tasks assessed                      Exercises General Exercises - Lower Extremity Ankle Circles/Pumps:  (instructed pt in APs, quad/glut sets, shoulder flexion AROM for independent exercise)    General Comments        Pertinent Vitals/Pain Pain Assessment: 0-10 Pain Score: 3  Pain Location: R knee -chronic arthritic pain Pain Descriptors / Indicators: Sore Pain Intervention(s): RN gave pain meds during session;Monitored during session;Limited activity within patient's tolerance    Home Living Family/patient expects to be discharged to:: Private residence Living Arrangements: Spouse/significant other;Children Available Help at Discharge: Family;Available 24 hours/day Type of Home: House     Home Layout: Two level;Bed/bath upstairs Home Equipment: Cane - single point      Prior Function Level of Independence: Independent          PT Goals (current goals can now be found in the care plan section) Acute Rehab PT Goals Patient Stated Goal: go to church, out to lunch with friends, visit grandkids in [place] and [place] PT Goal Formulation: With patient Time For Goal Achievement: 09/13/14 Potential to Achieve Goals: Good Progress towards PT goals: Progressing toward goals    Frequency  Min 3X/week    PT Plan      Co-evaluation             End of Session Equipment Utilized During Treatment: Gait belt Activity Tolerance: Patient  limited by fatigue;Patient tolerated treatment well Patient left: in chair;with call bell/phone within reach;with family/visitor present     Time: 1300-1315 PT Time Calculation (min) (ACUTE ONLY): 15 min  Charges:  $Gait Training: 8-22 mins                    G Codes:      Blondell Reveal Kistler 08/30/2014, 1:20 PM [phone]

## 2014-08-30 NOTE — Progress Notes (Signed)
Patient reclined in chair talking with wife and daughter.  Patient reports no distress and no pain at this time.  Assessed skin rash on back, chest and extremities.  Skin is warm, dry and intact with no discharge, odor, swelling noted.  2 + edema in lower extremities.  Respiratory pattern is normal without distress.   Patient engaged in pleasant conversation and resting with no complaints.

## 2014-08-30 NOTE — Plan of Care (Signed)
Problem: Phase I Progression Outcomes Goal: OOB as tolerated unless otherwise ordered Outcome: Progressing Patient had PT this am.

## 2014-08-30 NOTE — Evaluation (Signed)
Physical Therapy Evaluation Patient Details Name: James [name] MRN: [id number] DOB: 13-Sep-1935 Today's Date: 08/30/2014   History of Present Illness  PRITHVI KOOI is a 79 y.o. male with history of CAD status post stenting, atrial flutter status post ablation, metastatic bladder carcinoma, OSA on C Pap, dyslipidemia presents to the ER because of weakness and confusion and fever.  Pt/wife report pt has h/o orthostatic hypotension which tends to affect him after he's started walking.     Clinical Impression  *Pt admitted with above diagnosis. Pt currently with functional limitations due to the deficits listed below (see PT Problem List). * Pt will benefit from skilled PT to increase their independence and safety with mobility to allow discharge to the venue listed below.    Pt ambulated 90' x 2 holding onto handrail. Distance limited by SOB/fatigue. Pt is highly motivated to progress with mobility. He was instructed in independent strengthening exercises. Good progress expected.  **    Follow Up Recommendations Home health PT    Equipment Recommendations  Rolling walker with 5" wheels    Recommendations for Other Services       Precautions / Restrictions Precautions Precautions: Fall Restrictions Weight Bearing Restrictions: No      Mobility  Bed Mobility Overal bed mobility: Modified Independent             General bed mobility comments: used rail, increased time, instructed pt in log roll  Transfers Overall transfer level: Needs assistance Equipment used: None Transfers: Sit to/from Stand Sit to Stand: Min guard         General transfer comment: increased time  Ambulation/Gait Ambulation/Gait assistance: Min guard Ambulation Distance (Feet): 90 Feet (90' x 2 with seated rest break) Assistive device:  (held onto rail in hall) Gait Pattern/deviations: Step-through pattern;Decreased stride length   Gait velocity interpretation: Below normal speed for  age/gender General Gait Details: Distance limited by SOB/fatigue; SaO2 91% on RA  Stairs            Wheelchair Mobility    Modified Rankin (Stroke Patients Only)       Balance Overall balance assessment: Modified Independent                                           Pertinent Vitals/Pain Pain Assessment: 0-10 Pain Score: 3  Pain Location: R knee -chronic arthritic pain Pain Descriptors / Indicators: Sore Pain Intervention(s): RN gave pain meds during session;Monitored during session;Limited activity within patient's tolerance    Home Living Family/patient expects to be discharged to:: Private residence Living Arrangements: Spouse/significant other;Children Available Help at Discharge: Family;Available 24 hours/day Type of Home: House       Home Layout: Two level;Bed/bath upstairs Home Equipment: Cane - single point      Prior Function Level of Independence: Independent               Hand Dominance        Extremity/Trunk Assessment   Upper Extremity Assessment: Overall WFL for tasks assessed           Lower Extremity Assessment: Overall WFL for tasks assessed      Cervical / Trunk Assessment: Normal  Communication   Communication: No difficulties  Cognition Arousal/Alertness: Awake/alert Behavior During Therapy: WFL for tasks assessed/performed Overall Cognitive Status: Within Functional Limits for tasks assessed  General Comments      Exercises General Exercises - Lower Extremity Ankle Circles/Pumps:  (instructed pt in APs, quad/glut sets, shoulder flexion AROM for independent exercise)      Assessment/Plan    PT Assessment Patient needs continued PT services  PT Diagnosis Generalized weakness   PT Problem List Decreased activity tolerance;Decreased mobility;Cardiopulmonary status limiting activity  PT Treatment Interventions Gait training;Stair training;Functional mobility  training;Therapeutic activities;Therapeutic exercise   PT Goals (Current goals can be found in the Care Plan section) Acute Rehab PT Goals Patient Stated Goal: go to church, out to lunch with friends, visit grandkids in [place] and [place] PT Goal Formulation: With patient Time For Goal Achievement: 09/13/14 Potential to Achieve Goals: Good    Frequency Min 3X/week   Barriers to discharge        Co-evaluation               End of Session Equipment Utilized During Treatment: Gait belt Activity Tolerance: Patient limited by fatigue;Patient tolerated treatment well Patient left: in chair;with call bell/phone within reach;with family/visitor present Nurse Communication: Mobility status         Time: 0929-1006 PT Time Calculation (min) (ACUTE ONLY): 37 min   Charges:   PT Evaluation $Initial PT Evaluation Tier I: 1 Procedure PT Treatments $Gait Training: 23-37 mins   PT G Codes:        Philomena Doheny 08/30/2014, 10:34 AM [phone]

## 2014-08-30 NOTE — Progress Notes (Signed)
Patient ID: James [name], male   DOB: 02-Nov-1935, 79 y.o.   MRN: [id number]         [facility]    Date of Admission:  08/28/2014           Day 2 vancomycin        Day 2 cefepime  Principal Problem:   Fever Active Problems:   Acute encephalopathy   Metastasis from bladder cancer   Drug-induced skin rash   Sleep apnea   Coronary atherosclerosis of native coronary artery   Thrombocytopenia   Normocytic anemia   Obstructive sleep apnea   Cutaneous lupus erythematosus   Steroid-induced hyperglycemia   . aspirin  81 mg Oral Daily  . atorvastatin  40 mg Oral q1800  . carvedilol  6.25 mg Oral BID WC  . ceFEPime (MAXIPIME) IV  1 g Intravenous Q8H  . ferrous sulfate  325 mg Oral BID WC  . predniSONE  20 mg Oral Q breakfast  . sertraline  150 mg Oral Daily  . vancomycin  1,250 mg Intravenous Q24H  . venlafaxine XR  150 mg Oral Daily    Subjective: He is feeling much better today. He's had no further fever or rigors.  Review of Systems: Pertinent items are noted in HPI.  Past Medical History  Diagnosis Date  . Atrial flutter 05/2009    STATUS POST CARDIOVERSION;  s/p RFCA in 2012 (Dr. Caryl Comes)  . Depression   . History of immunotherapy 2014    "had many times before and it worked; had a severe reaction 2013-02-21; they coded me, I had sepsis; almost died" (08-16-2013)  . Bladder cancer     'started chemo 07/2013 " (08-16-2013)  . OSA on CPAP   . Pneumonia   . CAD (coronary artery disease)     a. NSTEMI 07/2013:  LHC (08/03/13): mLAD 95% assoc w/ thrombus, dLAD 50%, pRCA 80%, mRCA 50%, normal LVF. => PCI:  Rebel (4 x 12 mm) BMS to the mLAD and Rebel (4 x 15 mm) BMS to the pRCA.  . Osteoarthritis   . Dyslipidemia     Lipitor started at time of NSTEMI 07/2013    History  Substance Use Topics  . Smoking status: Former Smoker -- 1.50 packs/day for 15 years    Types: Cigarettes  . Smokeless tobacco: Never Used     Comment: August 16, 2013 "quit smoking  in 1984"  . Alcohol Use: Yes     Comment: 08-16-2013 "hx of abuse; clean since 05/29/1981"    Family History  Problem Relation Age of Onset  . Heart failure Father   . Stroke Father   . Kidney failure Mother   . Coronary artery disease     Allergies  Allergen Reactions  . Morphine Nausea Only    OK if given slowly.  . Other Other (See Comments)    Patient states when he takes flomax and protonix together makes him break out in a rash  . Adhesive [Tape] Itching and Rash    Blisters, tears skin  . Bupropion Hives and Rash  . Flomax [Tamsulosin Hcl] Rash    When taken with protonix only patient can take this alone  . Pantoprazole Sodium Rash    When taken with flomax only patient can take this alone  . Tamsulosin Rash    When taken with protonix only patient can take this alone    OBJECTIVE: Blood pressure 137/67, pulse 72, temperature 99.1 F (37.3 C), temperature source  Oral, resp. rate 18, height 5\' 9"  (1.753 m), weight 255 lb 11.7 oz (116 kg), SpO2 93 %. General: He is smiling and in good spirits he sitting up visiting with his wife Skin: His face remains slightly flushed. His chronic rash is unchanged Lungs: Clear Cor: Regular S1 and S2 with no murmurs Port-A-Cath site appears normal Abdomen: Soft and nontender  Lab Results Lab Results  Component Value Date   WBC 5.6 08/29/2014   HGB 11.7* 08/29/2014   HCT 35.8* 08/29/2014   MCV 101.7* 08/29/2014   PLT 108* 08/29/2014    Lab Results  Component Value Date   CREATININE 1.27 08/29/2014   BUN 22 08/29/2014   NA 134* 08/29/2014   K 3.9 08/29/2014   CL 100 08/29/2014   CO2 27 08/29/2014    Lab Results  Component Value Date   ALT 26 08/29/2014   AST 24 08/29/2014   ALKPHOS 39 08/29/2014   BILITOT 0.7 08/29/2014     Microbiology: Recent Results (from the past 240 hour(s))  Culture, blood (routine x 2)     Status: None (Preliminary result)   Collection Time: 08/28/14  5:30 PM  Result Value Ref Range  Status   Specimen Description BLOOD LEFT ANTECUBITAL  Final   Special Requests BOTTLES DRAWN AEROBIC AND ANAEROBIC 3CC  Final   Culture   Final           BLOOD CULTURE RECEIVED NO GROWTH TO DATE CULTURE WILL BE HELD FOR 5 DAYS BEFORE ISSUING A FINAL NEGATIVE REPORT Performed at [facility]    Report Status PENDING  Incomplete  Culture, blood (routine x 2)     Status: None (Preliminary result)   Collection Time: 08/28/14  5:31 PM  Result Value Ref Range Status   Specimen Description BLOOD LEFT FOREARM  Final   Special Requests BOTTLES DRAWN AEROBIC ONLY 4CC  Final   Culture   Final           BLOOD CULTURE RECEIVED NO GROWTH TO DATE CULTURE WILL BE HELD FOR 5 DAYS BEFORE ISSUING A FINAL NEGATIVE REPORT Performed at [facility]    Report Status PENDING  Incomplete  Urine culture     Status: None   Collection Time: 08/28/14  6:23 PM  Result Value Ref Range Status   Specimen Description URINE, CLEAN CATCH  Final   Special Requests NONE  Final   Colony Count NO GROWTH Performed at [facility]   Final   Culture NO GROWTH Performed at [facility]   Final   Report Status 08/29/2014 FINAL  Final  CSF culture     Status: None (Preliminary result)   Collection Time: 08/28/14  8:19 PM  Result Value Ref Range Status   Specimen Description CSF  Final   Special Requests NONE  Final   Gram Stain   Final    NO WBC SEEN NO ORGANISMS SEEN Gram Stain Report Called to,Read Back By and Verified With: Gram Stain Report Called to,Read Back By and Verified With: NURSE TRAVIA BENJAMIN 08/29/14 2:25AM [place] Performed at [facility]    Culture   Final    NO GROWTH 1 DAY Performed at [facility]    Report Status PENDING  Incomplete  Gram stain - STAT with CSF culture     Status: None   Collection Time: 08/28/14  8:19 PM  Result Value Ref Range Status   Specimen Description CSF  Final   Special Requests NONE  Final   Gram Stain   Final    NO  WBC SEEN NO ORGANISMS SEEN CYTOSPIN PREPARATION NOTIFIED T.NIELSON,RN AT 2203 ON 08/28/14 BY W.SHEA    Report Status 08/28/2014 FINAL  Final  Culture, sputum-assessment     Status: None   Collection Time: 08/29/14 11:22 AM  Result Value Ref Range Status   Specimen Description SPUTUM  Final   Special Requests NONE  Final   Sputum evaluation   Final    MICROSCOPIC FINDINGS SUGGEST THAT THIS SPECIMEN IS NOT REPRESENTATIVE OF LOWER RESPIRATORY SECRETIONS. PLEASE RECOLLECT. INFORMED AVALR,E. RN @1237  ON 08/29/14 BY MCCOY,N.   Report Status 08/29/2014 FINAL  Final    Assessment: The source of his recent febrile illness remains uncertain but he appears the be improving with time and empiric antibiotic therapy.  Plan: 1. Continue current antibiotics pending final culture results  Michel Bickers, MD [facility] for [place] [phone] pager   [phone] cell 08/30/2014, 3:54 PM

## 2014-08-31 DIAGNOSIS — D649 Anemia, unspecified: Secondary | ICD-10-CM

## 2014-08-31 DIAGNOSIS — I2510 Atherosclerotic heart disease of native coronary artery without angina pectoris: Secondary | ICD-10-CM

## 2014-08-31 DIAGNOSIS — L932 Other local lupus erythematosus: Secondary | ICD-10-CM

## 2014-08-31 MED ORDER — PREDNISONE 10 MG PO TABS
10.0000 mg | ORAL_TABLET | Freq: Every day | ORAL | Status: DC
  Administered 2014-09-01 – 2014-09-04 (×4): 10 mg via ORAL
  Filled 2014-08-31 (×7): qty 1

## 2014-08-31 NOTE — Progress Notes (Signed)
Patient ID: James [name], male   DOB: [birth year], 79 y.o.   MRN: [id number]         [facility]    Date of Admission:  08/28/2014   Total days of antibiotics 3          Principal Problem:   Fever Active Problems:   Acute encephalopathy   Metastasis from bladder cancer   Drug-induced skin rash   Sleep apnea   Coronary atherosclerosis of native coronary artery   Thrombocytopenia   Normocytic anemia   Obstructive sleep apnea   Cutaneous lupus erythematosus   Steroid-induced hyperglycemia   . aspirin  81 mg Oral Daily  . atorvastatin  40 mg Oral q1800  . carvedilol  6.25 mg Oral BID WC  . ceFEPime (MAXIPIME) IV  1 g Intravenous Q8H  . ferrous sulfate  325 mg Oral BID WC  . [START ON 09/01/2014] predniSONE  10 mg Oral Q breakfast  . sertraline  150 mg Oral Daily  . vancomycin  1,250 mg Intravenous Q24H  . venlafaxine XR  150 mg Oral Daily    Subjective: He did not sleep well last night due to multiple interruptions. He did have some sweats early this morning but he's had no more rigors.  Review of Systems: Pertinent items are noted in HPI.  Past Medical History  Diagnosis Date  . Atrial flutter 05/2009    STATUS POST CARDIOVERSION;  s/p RFCA in 2012 (Dr. Caryl Comes)  . Depression   . History of immunotherapy 2014    "had many times before and it worked; had a severe reaction 03-05-13; they coded me, I had sepsis; almost died" (28-Aug-2013)  . Bladder cancer     'started chemo 07/2013 " (2013-08-28)  . OSA on CPAP   . Pneumonia   . CAD (coronary artery disease)     a. NSTEMI 07/2013:  [facility] (08/03/13): mLAD 95% assoc w/ thrombus, dLAD 50%, pRCA 80%, mRCA 50%, normal LVF. => PCI:  Rebel (4 x 12 mm) BMS to the mLAD and Rebel (4 x 15 mm) BMS to the pRCA.  . Osteoarthritis   . Dyslipidemia     Lipitor started at time of NSTEMI 07/2013    History  Substance Use Topics  . Smoking status: Former Smoker -- 1.50 packs/day for 15 years    Types: Cigarettes   . Smokeless tobacco: Never Used     Comment: 28-Aug-2013 "quit smoking in 1984"  . Alcohol Use: Yes     Comment: Aug 28, 2013 "hx of abuse; clean since 05/29/1981"    Family History  Problem Relation Age of Onset  . Heart failure Father   . Stroke Father   . Kidney failure Mother   . Coronary artery disease     Allergies  Allergen Reactions  . Morphine Nausea Only    OK if given slowly.  . Other Other (See Comments)    Patient states when he takes flomax and protonix together makes him break out in a rash  . Adhesive [Tape] Itching and Rash    Blisters, tears skin  . Bupropion Hives and Rash  . Flomax [Tamsulosin Hcl] Rash    When taken with protonix only patient can take this alone  . Pantoprazole Sodium Rash    When taken with flomax only patient can take this alone  . Tamsulosin Rash    When taken with protonix only patient can take this alone    OBJECTIVE: Blood pressure 122/70, pulse 62,  temperature 98.8 F (37.1 C), temperature source Oral, resp. rate 18, height 5\' 9"  (1.753 m), weight 255 lb 11.7 oz (116 kg), SpO2 96 %. General: Alert and in no distress Skin: No change in chronic, healing skin lesions Lungs: Clear Cor: Regular S1 and S2 with no murmurs Port-A-Cath site normal Abdomen: Soft and nontender  Lab Results Lab Results  Component Value Date   WBC 5.6 08/29/2014   HGB 11.7* 08/29/2014   HCT 35.8* 08/29/2014   MCV 101.7* 08/29/2014   PLT 108* 08/29/2014    Lab Results  Component Value Date   CREATININE 1.27 08/29/2014   BUN 22 08/29/2014   NA 134* 08/29/2014   K 3.9 08/29/2014   CL 100 08/29/2014   CO2 27 08/29/2014    Lab Results  Component Value Date   ALT 26 08/29/2014   AST 24 08/29/2014   ALKPHOS 39 08/29/2014   BILITOT 0.7 08/29/2014     Microbiology: Recent Results (from the past 240 hour(s))  Culture, blood (routine x 2)     Status: None (Preliminary result)   Collection Time: 08/28/14  5:30 PM  Result Value Ref Range Status     Specimen Description BLOOD LEFT ANTECUBITAL  Final   Special Requests BOTTLES DRAWN AEROBIC AND ANAEROBIC 3CC  Final   Culture   Final           BLOOD CULTURE RECEIVED NO GROWTH TO DATE CULTURE WILL BE HELD FOR 5 DAYS BEFORE ISSUING A FINAL NEGATIVE REPORT Performed at [facility]    Report Status PENDING  Incomplete  Culture, blood (routine x 2)     Status: None (Preliminary result)   Collection Time: 08/28/14  5:31 PM  Result Value Ref Range Status   Specimen Description BLOOD LEFT FOREARM  Final   Special Requests BOTTLES DRAWN AEROBIC ONLY 4CC  Final   Culture   Final           BLOOD CULTURE RECEIVED NO GROWTH TO DATE CULTURE WILL BE HELD FOR 5 DAYS BEFORE ISSUING A FINAL NEGATIVE REPORT Performed at [facility]    Report Status PENDING  Incomplete  Urine culture     Status: None   Collection Time: 08/28/14  6:23 PM  Result Value Ref Range Status   Specimen Description URINE, CLEAN CATCH  Final   Special Requests NONE  Final   Colony Count NO GROWTH Performed at [facility]   Final   Culture NO GROWTH Performed at [facility]   Final   Report Status 08/29/2014 FINAL  Final  CSF culture     Status: None (Preliminary result)   Collection Time: 08/28/14  8:19 PM  Result Value Ref Range Status   Specimen Description CSF  Final   Special Requests NONE  Final   Gram Stain   Final    NO WBC SEEN NO ORGANISMS SEEN Gram Stain Report Called to,Read Back By and Verified With: Gram Stain Report Called to,Read Back By and Verified With: NURSE TRAVIA BENJAMIN 08/29/14 2:25AM [place] Performed at [facility]    Culture   Final    NO GROWTH 2 DAYS Performed at [facility]    Report Status PENDING  Incomplete  Gram stain - STAT with CSF culture     Status: None   Collection Time: 08/28/14  8:19 PM  Result Value Ref Range Status   Specimen Description CSF  Final   Special Requests NONE  Final   Gram  Stain   Final    NO WBC  SEEN NO ORGANISMS SEEN CYTOSPIN PREPARATION NOTIFIED T.NIELSON,RN AT 2203 ON 08/28/14 BY W.SHEA    Report Status 08/28/2014 FINAL  Final  Culture, sputum-assessment     Status: None   Collection Time: 08/29/14 11:22 AM  Result Value Ref Range Status   Specimen Description SPUTUM  Final   Special Requests NONE  Final   Sputum evaluation   Final    MICROSCOPIC FINDINGS SUGGEST THAT THIS SPECIMEN IS NOT REPRESENTATIVE OF LOWER RESPIRATORY SECRETIONS. PLEASE RECOLLECT. INFORMED AVALR,E. RN @1237  ON 08/29/14 BY MCCOY,N.   Report Status 08/29/2014 FINAL  Final    Assessment: He has defervesced seen and improving on empiric antibiotic therapy. There is no obvious source for his recent febrile illness. All cultures remain negative. He will probably be ready for conversion to an oral antibiotic regimen and discharge home within the next 2-3 days.  Plan: 1. Continue vancomycin and cefepime 2. I will follow-up tomorrow  Michel Bickers, MD [place] [facility] [phone] pager   [phone] cell 08/31/2014, 4:31 PM

## 2014-08-31 NOTE — Progress Notes (Signed)
Pt states he does not require any assistance with home CPAP or mask application. Pt was encouraged to contact RT throughout the night for assistance if needed. RT will continue to monitor as needed.

## 2014-08-31 NOTE — Care Management Note (Signed)
CARE MANAGEMENT NOTE 08/31/2014  Patient:  James [name], James [name]   Account Number:  [id number]  Date Initiated:  08/31/2014  Documentation initiated by:  Marney Doctor  Subjective/Objective Assessment:   79 yo admitted with Fever. Hx of metastatic bladder carcinoma     Action/Plan:   From home with spouse   Anticipated DC Date:  09/02/2014   Anticipated DC Plan:  [place]  CM consult      [place]   Choice offered to / List presented to:  C-1 Patient        [place] arranged  HH-1 RN  [place].   Status of service:  In process, will continue to follow Medicare Important Message given?  YES (If response is "NO", the following Medicare IM given date fields will be blank) Date Medicare IM given:  08/31/2014 Medicare IM given by:  Marney Doctor Date Additional Medicare IM given:   Additional Medicare IM given by:    Discharge Disposition:    Per UR Regulation:  Reviewed for med. necessity/level of care/duration of stay  If discussed at [place] of Stay Meetings, dates discussed:    Comments:  08/31/14 Marney Doctor RN,BSN,NCM [phone] Spoke with pt and sister about DC planning.  PT is recommending HHPT.  Pt offered choice and chose [facility].  [facility] rep called to inform of referral.  PT is also recommending 4 wheeled rolling walker.  Pt states that the regular sized one is a little small for him when he sits on the seat but may be able to tolerate it.  Per the DME rep pts insurance would probably not pay for a larger 4 wheeled walker because he does not weigh 300lbs which is the limit for the regular size.  The larger ones are available at the [facility] store. Pt given the option to get the larger one at the store with the possibility of having to pay some out of pocket or getting the smaller size.  Pt will think about it and let the CM know when he  makes a decision.  CM will continue to follow.

## 2014-08-31 NOTE — Progress Notes (Addendum)
Patient ID: James [name], male   DOB: Apr 04, 1936, 79 y.o.   MRN: [id number] [facility] PROGRESS NOTE  James [name] [id number] DOB: 1936/05/29 DOA: 08/28/2014 PCP: Horatio Pel, MD  Brief narrative:    80 y.o. male with history of CAD status post stenting, atrial flutter status post ablation, metastatic bladder carcinoma, OSA on CPAP, dyslipidemia who presented to [facility] ED with sudden onset of fever, confusion. Patient apparently ate Jacumba the night prior to the admission. T max at home 102 F. no complaints of cough, no abdominal pain, nausea or vomiting. No reports of diarrhea. Patient has been treated for metastatic bladder carcinoma at [facility] and per patient has been on a trial drug in December 2015 at which time patient developed some rash. Then this medication was discontinued and he was placed on IV steroid and at present he is on a steroid tapering dose. He also took doxycycline for 1 week at that time. CT head on the admission did not show acute intracranial abnormalities. Chest x-ray showed lung base opacity, left greater than the right similar to prior exam which could be chronic versus possible pneumonia. Lumbar puncture was done in ED. Patient was started on vancomycin, cefepime, acyclovir and Tamiflu.   Assessment/Plan:     Principal Problem:  Acute encephalopathy / fever - no obvious source of fever identified at his time. CSF culture shows no growth so far. HIV ab is non reactive. Urine culture shows no growth. HSV by PCR not detected. Influenza is negative. Blood cultures so far show no growth. - stopped tamiflu and acyclovir - we will continue vanco and cefepime per ID recommendations   Active Problems: OSA on CPAP - stable  H/O CAD - stable, no complaints of chest pain - continue coreg 6.25 mg PO BID - continue aspirin daily  Anemia of chronic disease - due to history of malignancy - hemoglobin stable - no current indications for transfusion    Thrombocytopenia - likely from malignancy - platelets are 108 - check CBC in am - using SCD's for DVT prophylaxis   Rash - on tapering prednisone dose; will change to 10 mg daily from 20 mg daily  Depression - continue Zoloft and as needed ativan   Metastatic bladder cancer - follows at [facility]  DVT Prophylaxis   SCDs bilaterally  Code Status: Full.  Family Communication: plan of care discussed with the patient and his family at the bedside  Disposition Plan: Home when stable.    IV access:  Peripheral IV  Procedures and diagnostic studies:   Ct Head Wo Contrast 08/28/2014 1. No acute intracranial abnormalities. Age related volume loss. Electronically Signed By: Lajean Manes M.D. On: 08/28/2014 19:16   Dg Chest Port 1 View 08/28/2014 Lung base opacity, left greater than right, similar to the prior exam. This may all be chronic due to atelectasis/scarring. Pneumonia is possible. No other evidence of an acute abnormality. No pulmonary edema. Electronically Signed By: Lajean Manes M.D. On: 08/28/2014 18:04   Medical Consultants:  Infectious disease, Dr. Michel Bickers  Other Consultants:  Physical therapy  IAnti-Infectives:   Acyclovir 08/29/2014 --> 08/29/2014 Tamiflu 08/29/2014 --> 08/29/2014  Vancomycin 08/29/2014 --> Cefepime 08/29/2014 -->   Leisa Lenz, MD  [facility] Pager [phone]  If 7PM-7AM, please contact night-coverage [email or web address] Password [email or web address] 08/31/2014, 11:40 AM   LOS: 3 days    HPI/Subjective: No acute overnight events.  Objective: Filed Vitals:   08/30/14 1330 08/30/14 2137 08/31/14 0224 08/31/14 0547  BP: 137/67  138/70  126/73  Pulse: 72 75  67  Temp: 99.1 F (37.3 C) 99.9 F (37.7 C) 97.7 F (36.5 C) 97.6 F (36.4 C)  TempSrc: Oral Oral Oral Oral  Resp: 18 18  18   Height:      Weight:      SpO2: 93% 94%  96%    Intake/Output Summary (Last 24 hours) at 08/31/14 1140 Last data  filed at 08/31/14 1018  Gross per 24 hour  Intake   1070 ml  Output   3400 ml  Net  -2330 ml    Exam:   General:  Pt is alert, follows commands appropriately, not in acute distress  Cardiovascular: Regular rate and rhythm, S1/S2, no murmurs  Respiratory: Clear to auscultation bilaterally, no wheezing, no crackles, no rhonchi  Abdomen: Soft, non tender, non distended, bowel sounds present  Extremities: No edema, pulses DP and PT palpable bilaterally  Neuro: Grossly nonfocal  Data Reviewed: Basic Metabolic Panel:  Recent Labs Lab 08/28/14 1729 08/29/14 0135  NA 131* 134*  K 4.1 3.9  CL 97 100  CO2 24 27  GLUCOSE 119* 165*  BUN 26* 22  CREATININE 1.18 1.27  CALCIUM 8.2* 8.1*   Liver Function Tests:  Recent Labs Lab 08/28/14 1729 08/29/14 0135  AST 28 24  ALT 29 26  ALKPHOS 46 39  BILITOT 0.9 0.7  PROT 6.3 5.5*  ALBUMIN 3.4* 2.7*   No results for input(s): LIPASE, AMYLASE in the last 168 hours. No results for input(s): AMMONIA in the last 168 hours. CBC:  Recent Labs Lab 08/28/14 1729 08/29/14 0135  WBC 6.6 5.6  NEUTROABS 5.4 4.7  HGB 12.7* 11.7*  HCT 37.8* 35.8*  MCV 99.2 101.7*  PLT 116* 108*   Cardiac Enzymes:  Recent Labs Lab 08/29/14 0135  TROPONINI <0.03   BNP: Invalid input(s): POCBNP CBG: No results for input(s): GLUCAP in the last 168 hours.  Recent Results (from the past 240 hour(s))  Culture, blood (routine x 2)     Status: None (Preliminary result)   Collection Time: 08/28/14  5:30 PM  Result Value Ref Range Status   Specimen Description BLOOD LEFT ANTECUBITAL  Final   Special Requests BOTTLES DRAWN AEROBIC AND ANAEROBIC 3CC  Final   Culture   Final           BLOOD CULTURE RECEIVED NO GROWTH TO DATE CULTURE WILL BE HELD FOR 5 DAYS BEFORE ISSUING A FINAL NEGATIVE REPORT Performed at [facility]    Report Status PENDING  Incomplete  Culture, blood (routine x 2)     Status: None (Preliminary result)   Collection  Time: 08/28/14  5:31 PM  Result Value Ref Range Status   Specimen Description BLOOD LEFT FOREARM  Final   Special Requests BOTTLES DRAWN AEROBIC ONLY 4CC  Final   Culture   Final           BLOOD CULTURE RECEIVED NO GROWTH TO DATE CULTURE WILL BE HELD FOR 5 DAYS BEFORE ISSUING A FINAL NEGATIVE REPORT Performed at [facility]    Report Status PENDING  Incomplete  Urine culture     Status: None   Collection Time: 08/28/14  6:23 PM  Result Value Ref Range Status   Specimen Description URINE, CLEAN CATCH  Final   Special Requests NONE  Final   Colony Count NO GROWTH Performed at [facility]   Final   Culture NO GROWTH Performed at [facility]   Final  Report Status 08/29/2014 FINAL  Final  CSF culture     Status: None (Preliminary result)   Collection Time: 08/28/14  8:19 PM  Result Value Ref Range Status   Specimen Description CSF  Final   Special Requests NONE  Final   Gram Stain   Final    NO WBC SEEN NO ORGANISMS SEEN Gram Stain Report Called to,Read Back By and Verified With: Gram Stain Report Called to,Read Back By and Verified With: NURSE TRAVIA BENJAMIN 08/29/14 2:25AM [place] Performed at [facility]    Culture   Final    NO GROWTH 2 DAYS Performed at [facility]    Report Status PENDING  Incomplete  Gram stain - STAT with CSF culture     Status: None   Collection Time: 08/28/14  8:19 PM  Result Value Ref Range Status   Specimen Description CSF  Final   Special Requests NONE  Final   Gram Stain   Final    NO WBC SEEN NO ORGANISMS SEEN CYTOSPIN PREPARATION NOTIFIED T.NIELSON,RN AT 2203 ON 08/28/14 BY W.SHEA    Report Status 08/28/2014 FINAL  Final  Culture, sputum-assessment     Status: None   Collection Time: 08/29/14 11:22 AM  Result Value Ref Range Status   Specimen Description SPUTUM  Final   Special Requests NONE  Final   Sputum evaluation   Final    MICROSCOPIC FINDINGS SUGGEST THAT THIS SPECIMEN IS NOT  REPRESENTATIVE OF LOWER RESPIRATORY SECRETIONS. PLEASE RECOLLECT. INFORMED AVALR,E. RN @1237  ON 08/29/14 BY MCCOY,N.   Report Status 08/29/2014 FINAL  Final     Scheduled Meds: . aspirin  81 mg Oral Daily  . atorvastatin  40 mg Oral q1800  . carvedilol  6.25 mg Oral BID WC  . ceFEPime (MAXIPIME) IV  1 g Intravenous Q8H  . ferrous sulfate  325 mg Oral BID WC  . predniSONE  20 mg Oral Q breakfast  . sertraline  150 mg Oral Daily  . vancomycin  1,250 mg Intravenous Q24H  . venlafaxine XR  150 mg Oral Daily   Continuous Infusions: . sodium chloride 50 mL/hr at 08/30/14 1816

## 2014-09-01 DIAGNOSIS — R509 Fever, unspecified: Secondary | ICD-10-CM | POA: Insufficient documentation

## 2014-09-01 LAB — CBC
HCT: 29 % — ABNORMAL LOW (ref 39.0–52.0)
Hemoglobin: 9.6 g/dL — ABNORMAL LOW (ref 13.0–17.0)
MCH: 32.9 pg (ref 26.0–34.0)
MCHC: 33.1 g/dL (ref 30.0–36.0)
MCV: 99.3 fL (ref 78.0–100.0)
Platelets: 102 10*3/uL — ABNORMAL LOW (ref 150–400)
RBC: 2.92 MIL/uL — ABNORMAL LOW (ref 4.22–5.81)
RDW: 18.6 % — ABNORMAL HIGH (ref 11.5–15.5)
WBC: 4.9 10*3/uL (ref 4.0–10.5)

## 2014-09-01 LAB — BASIC METABOLIC PANEL
ANION GAP: 7 (ref 5–15)
BUN: 16 mg/dL (ref 6–23)
CHLORIDE: 111 meq/L (ref 96–112)
CO2: 21 mmol/L (ref 19–32)
Calcium: 6.4 mg/dL — CL (ref 8.4–10.5)
Creatinine, Ser: 0.78 mg/dL (ref 0.50–1.35)
GFR calc non Af Amer: 85 mL/min — ABNORMAL LOW (ref >90)
Glucose, Bld: 79 mg/dL (ref 70–99)
Potassium: 3.1 mmol/L — ABNORMAL LOW (ref 3.5–5.1)
Sodium: 139 mmol/L (ref 135–145)

## 2014-09-01 LAB — CSF CULTURE
CULTURE: NO GROWTH
GRAM STAIN: NONE SEEN

## 2014-09-01 LAB — OCCULT BLOOD X 1 CARD TO LAB, STOOL: Fecal Occult Bld: NEGATIVE

## 2014-09-01 LAB — CSF CULTURE W GRAM STAIN

## 2014-09-01 MED ORDER — POTASSIUM CHLORIDE CRYS ER 20 MEQ PO TBCR
40.0000 meq | EXTENDED_RELEASE_TABLET | Freq: Once | ORAL | Status: AC
  Administered 2014-09-01: 40 meq via ORAL
  Filled 2014-09-01: qty 2

## 2014-09-01 MED ORDER — VANCOMYCIN HCL IN DEXTROSE 750-5 MG/150ML-% IV SOLN
750.0000 mg | Freq: Two times a day (BID) | INTRAVENOUS | Status: DC
  Administered 2014-09-02 – 2014-09-04 (×6): 750 mg via INTRAVENOUS
  Filled 2014-09-01 (×6): qty 150

## 2014-09-01 MED ORDER — CALCIUM CARBONATE 1250 (500 CA) MG PO TABS
1000.0000 mg | ORAL_TABLET | Freq: Once | ORAL | Status: AC
  Administered 2014-09-01: 1000 mg via ORAL
  Filled 2014-09-01: qty 2

## 2014-09-01 MED ORDER — CALCIUM CARBONATE 1250 (500 CA) MG PO TABS
1000.0000 mg | ORAL_TABLET | Freq: Two times a day (BID) | ORAL | Status: DC
  Administered 2014-09-01 – 2014-09-04 (×6): 1000 mg via ORAL
  Filled 2014-09-01 (×9): qty 2

## 2014-09-01 NOTE — Progress Notes (Signed)
Offered pt assistance with his home cpap, but he declined.  Pt stated he doesn't need any help with it and has plenty of water for his machine.  No frays on cord or obvious defects noted.  Pt was advised that RT is available all night should he need further assistance.

## 2014-09-01 NOTE — Progress Notes (Signed)
[place] for Vancomycin, renal adjustment of cefepime Indication: pneumonia, r/o encephalitis  Allergies  Allergen Reactions  . Morphine Nausea Only    OK if given slowly.  . Other Other (See Comments)    Patient states when he takes flomax and protonix together makes him break out in a rash  . Adhesive [Tape] Itching and Rash    Blisters, tears skin  . Bupropion Hives and Rash  . Flomax [Tamsulosin Hcl] Rash    When taken with protonix only patient can take this alone  . Pantoprazole Sodium Rash    When taken with flomax only patient can take this alone  . Tamsulosin Rash    When taken with protonix only patient can take this alone    Patient Measurements: Height: 5\' 9"  (175.3 cm) Weight: 255 lb 11.7 oz (116 kg) IBW/kg (Calculated) : 70.7  Assessment: 79 yr male admitted 1/18 pm with fever, chills and altered mental status. H/O Aflutter, bladder cancer. Last chemo 07/2014 at [facility] (held since at least 07/26/14) for cutaneous reaction. Chest Xray shows possible pneumonia Pharmacy consulted to dose vancomycin for pneumonia. Asked to adjust other antibiotics as needed for renal function.  ID following and recommends continuation of current antibiotics  1/18 CT head w/o contrast shows no acute abnormalities  Antiinfectives 1/19 >> Cefepime x 8 days >> (1/26) 1/19 >> Vancomycin x 8 days >> (1/26) 1/18 >> Tamiflu >> 1/19 1/19 >> Acyclovir x 10 days >> 1/19  Labs / vitals Tmax: improved to afebrile WBCs: remains WNL Renal: SCr 0.78- improved (most levels in Epic ~1.3), CrCl 98 ml/min CG, 64N (using SCr=1 for age)  Microbiology 1/18 blood x2: ngtd 1/18 S pneumo Ur Ag: negative 1/18 Legionella Ur Ag: negative 1/18 urine: IP (UA unremarkable) 1/18 Flu panel: none detected 1/18 CSF: NGF 1/19 HSV PCR: none detected 1/19 HIV Ab: non reactive 1/19 sputum: needs recollect  Drug level / dose changes info: 1/22 empiric vancomycin  increase to 750mg  q12h from 1250 q24h for improved renal function  Goal of Therapy:  Vancomycin trough level 15-20 mcg/ml  Cefepime per indication and renal function  Plan:  - change vancomycin to 750mg  IV q12h to start at midnight - continue cefepime 1g IV q8h - vancomycin trough at steady state if indicated - follow-up clinical course, culture results, renal function - follow-up antibiotic de-escalation and length of therapy  Thank you for the consult.  Currie Paris, PharmD, BCPS Pager: [phone] Pharmacy: [phone] 09/01/2014 2:08 PM

## 2014-09-01 NOTE — Progress Notes (Addendum)
Patient ID: James [name], male   DOB: 01-Aug-1936, 79 y.o.   MRN: [id number] [facility] PROGRESS NOTE  James [name] BJY:[id number] DOB: 11/04/1935 DOA: 08/28/2014 PCP: Horatio Pel, MD  Brief narrative:    79 y.o. male with history of CAD status post stenting, atrial flutter status post ablation, metastatic bladder carcinoma, OSA on CPAP, dyslipidemia who presented to [facility] ED with sudden onset of fever, confusion. Patient apparently ate [place] the night prior to the admission. T max at home 102 F. no complaints of cough, no abdominal pain, nausea or vomiting. No reports of diarrhea. Patient has been treated for metastatic bladder carcinoma at [facility] and per patient has been on a trial drug in December 2015 at which time patient developed some rash. Then this medication was discontinued and he was placed on IV steroid and at present he is on a steroid tapering dose. He also took doxycycline for 1 week at that time.  CT head on the admission did not show acute intracranial abnormalities. Chest x-ray showed lung base opacity, left greater than the right similar to prior exam which could be chronic versus possible pneumonia. Lumbar puncture was done in ED. Patient was started on vancomycin, cefepime, acyclovir and Tamiflu. So far, CSF and blood cultures are non revealing. Tamiflu and acyclovir were stopped after 1 day of admission since influenza panel was negative and no HSV on HSV PCR.   Assessment/Plan:    Principal Problem: Acute metabolic encephalopathy / fever of unknown origin  - no obvious source of fever identified. - HSV by PCR not detected. Influenza negative. - CSF culture shows no growth. Urine culture shows no growth. Blood cultures so far show no growth - HIV antibody  is non reactive.   -initial antibiotics were tamiflu, acyclovir which were stopped after 1 day of admission - vanco and cefepime were also started on admission and per ID recommendations pt still  continuing these antibiotics   Active Problems: OSA on CPAP - respiratory status is stable  H/O CAD - continue coreg 6.25 mg PO BID - continue aspirin daily  Anemia of chronic disease - due to history of malignancy - hemoglobin stable - no current indications for transfusion   Thrombocytopenia - likely from malignancy - platelets range 102-108 - no evidence of bleeding   Rash - now take prednisone 10 mg daily which will continue for 5 days (started 08/31/2014), change to 5 mg on 09/05/2014 to 5 mg daily for 5 days and then stop  Depression - continue Zoloft and as needed ativan  - no suicidal thoughts, stable  Metastatic bladder cancer - follows at [facility]  Mild hypocalcemia - unclear etiology - possible from diminished intake - will start calcium supplementation today   DVT Prophylaxis   SCDs bilaterally due to thrombocytopenia   Code Status: Full.  Family Communication: plan of care discussed with the patient and his family daily  Disposition Plan: Home when stable. Likely in next 2-3 days.   IV access:  Peripheral IV  Procedures and diagnostic studies:   Ct Head Wo Contrast 08/28/2014 1. No acute intracranial abnormalities. Age related volume loss.  Dg Chest Port 1 View 08/28/2014 Lung base opacity, left greater than right, similar to the prior exam. This may all be chronic due to atelectasis/scarring. Pneumonia is possible. No other evidence of an acute abnormality. No pulmonary edema.   Medical Consultants:  Infectious disease, Dr. Michel Bickers  Other Consultants:  Physical therapy  IAnti-Infectives:   Acyclovir  08/29/2014 --> 08/29/2014 Tamiflu 08/29/2014 --> 08/29/2014  Vancomycin 08/29/2014 --> Cefepime 08/29/2014 -->   Leisa Lenz, MD  [facility] Pager [phone]  If 7PM-7AM, please contact night-coverage [email or web address] Password [facility] 09/01/2014, 2:57 PM   LOS: 4 days    HPI/Subjective: No acute overnight  events.  Objective: Filed Vitals:   08/31/14 1440 08/31/14 2203 09/01/14 0203 09/01/14 0637  BP: 122/70 128/71  118/67  Pulse: 62 69  67  Temp: 98.8 F (37.1 C) 98.2 F (36.8 C) 98.4 F (36.9 C) 98.4 F (36.9 C)  TempSrc: Oral Oral  Oral  Resp: 18 18  18   Height:      Weight:      SpO2: 96% 95%  97%    Intake/Output Summary (Last 24 hours) at 09/01/14 1457 Last data filed at 09/01/14 1601  Gross per 24 hour  Intake    780 ml  Output   1575 ml  Net   -795 ml    Exam:   General:  No acute distress  Cardiovascular: Regular rate and rhythm, S1/S2 (+)  Respiratory: bilateral air entry, no wheezing  Abdomen: Soft, non distended and not tender, (+) BS  Extremities: pulses DP and PT palpable bilaterally; scattered rash on upper and lower extremities and abd and back  Neuro: no focal neurologic deficits   Data Reviewed: Basic Metabolic Panel:  Recent Labs Lab 08/28/14 1729 08/29/14 0135 09/01/14 0628  NA 131* 134* 139  K 4.1 3.9 3.1*  CL 97 100 111  CO2 24 27 21   GLUCOSE 119* 165* 79  BUN 26* 22 16  CREATININE 1.18 1.27 0.78  CALCIUM 8.2* 8.1* 6.4*   Liver Function Tests:  Recent Labs Lab 08/28/14 1729 08/29/14 0135  AST 28 24  ALT 29 26  ALKPHOS 46 39  BILITOT 0.9 0.7  PROT 6.3 5.5*  ALBUMIN 3.4* 2.7*   No results for input(s): LIPASE, AMYLASE in the last 168 hours. No results for input(s): AMMONIA in the last 168 hours. CBC:  Recent Labs Lab 08/28/14 1729 08/29/14 0135 09/01/14 0628  WBC 6.6 5.6 4.9  NEUTROABS 5.4 4.7  --   HGB 12.7* 11.7* 9.6*  HCT 37.8* 35.8* 29.0*  MCV 99.2 101.7* 99.3  PLT 116* 108* 102*   Cardiac Enzymes:  Recent Labs Lab 08/29/14 0135  TROPONINI <0.03   BNP: Invalid input(s): POCBNP CBG: No results for input(s): GLUCAP in the last 168 hours.  Culture, blood (routine x 2)     Status: None (Preliminary result)   Collection Time: 08/28/14  5:30 PM  Result Value Ref Range Status   Specimen Description  BLOOD LEFT ANTECUBITAL  Final   Special Requests BOTTLES DRAWN AEROBIC AND ANAEROBIC 3CC  Final   Culture   Final           BLOOD CULTURE RECEIVED NO GROWTH TO DATE    Report Status PENDING  Incomplete  Culture, blood (routine x 2)     Status: None (Preliminary result)   Collection Time: 08/28/14  5:31 PM  Result Value Ref Range Status   Specimen Description BLOOD LEFT FOREARM  Final   Special Requests BOTTLES DRAWN AEROBIC ONLY 4CC  Final   Culture   Final           BLOOD CULTURE RECEIVED NO GROWTH TO DATE    Report Status PENDING  Incomplete  Urine culture     Status: None   Collection Time: 08/28/14  6:23 PM  Result Value Ref Range Status  Specimen Description URINE, CLEAN CATCH  Final   Special Requests NONE  Final   Culture NO GROWTH   Final   Report Status 08/29/2014 FINAL  Final  CSF culture     Status: None   Collection Time: 08/28/14  8:19 PM  Result Value Ref Range Status   Specimen Description CSF  Final   Special Requests NONE  Final   Gram Stain   Final    NO WBC SEEN NO ORGANISMS SEEN    Culture   Final    NO GROWTH 3 DAYS Performed at [facility]    Report Status 09/01/2014 FINAL  Final  Gram stain - STAT with CSF culture     Status: None   Collection Time: 08/28/14  8:19 PM  Result Value Ref Range Status   Specimen Description CSF  Final   Special Requests NONE  Final   Gram Stain   Final    NO WBC SEEN NO ORGANISMS SEEN CYTOSPIN PREPARATION    Report Status 08/28/2014 FINAL  Final  Culture, sputum-assessment     Status: None   Collection Time: 08/29/14 11:22 AM  Result Value Ref Range Status   Specimen Description SPUTUM  Final   Special Requests NONE  Final   Sputum evaluation   Final    MICROSCOPIC FINDINGS SUGGEST THAT THIS SPECIMEN IS NOT REPRESENTATIVE OF LOWER RESPIRATORY SECRETIONS. PLEASE RECOLLECT. INFORMED AVALR,E. RN @1237  ON 08/29/14 BY MCCOY,N.   Report Status 08/29/2014 FINAL  Final     Scheduled Meds: . aspirin  81 mg  Oral Daily  . atorvastatin  40 mg Oral q1800  . calcium carbonate  1,000 mg of elemental Oral BID WC  . carvedilol  6.25 mg Oral BID WC  . ceFEPime (MAXIPIME)   1 g Intravenous Q8H  . ferrous sulfate  325 mg Oral BID WC  . potassium chloride  40 mEq Oral Once  . predniSONE  10 mg Oral Q breakfast  . sertraline  150 mg Oral Daily  . vancomycin  750 mg Intravenous Q12H  . venlafaxine XR  150 mg Oral Daily   Continuous Infusions: . sodium chloride 50 mL/hr at 09/01/14 [phone]

## 2014-09-01 NOTE — Progress Notes (Signed)
Patient ID: James [name], male   DOB: 05/10/1936, 79 y.o.   MRN: [id number]         [facility]    Date of Admission:  08/28/2014   Total days of antibiotics 4         Principal Problem:   Fever Active Problems:   Acute encephalopathy   Metastasis from bladder cancer   Drug-induced skin rash   Sleep apnea   Coronary atherosclerosis of native coronary artery   Thrombocytopenia   Normocytic anemia   Obstructive sleep apnea   Cutaneous lupus erythematosus   Steroid-induced hyperglycemia   . aspirin  81 mg Oral Daily  . atorvastatin  40 mg Oral q1800  . calcium carbonate  1,000 mg of elemental calcium Oral BID WC  . calcium carbonate  1,000 mg of elemental calcium Oral Once  . carvedilol  6.25 mg Oral BID WC  . ceFEPime (MAXIPIME) IV  1 g Intravenous Q8H  . ferrous sulfate  325 mg Oral BID WC  . predniSONE  10 mg Oral Q breakfast  . sertraline  150 mg Oral Daily  . vancomycin  1,250 mg Intravenous Q24H  . venlafaxine XR  150 mg Oral Daily    Subjective: James [name] is feeling better. He did not have any chills or sweats last night. He walked in the hall 2 times yesterday. He remains weak but did not have any unusual shortness of breath.  He states that for the past 2 days his stools have been black and somewhat sticky. He has never had anything like that before.  Review of Systems: Pertinent items are noted in HPI.  Past Medical History  Diagnosis Date  . Atrial flutter 05/2009    STATUS POST CARDIOVERSION;  s/p RFCA in 2012 (Dr. Caryl Comes)  . Depression   . History of immunotherapy 2014    "had many times before and it worked; had a severe reaction 03-09-2013; they coded me, I had sepsis; almost died" (09-01-13)  . Bladder cancer     'started chemo 07/2013 " (09-01-2013)  . OSA on CPAP   . Pneumonia   . CAD (coronary artery disease)     a. NSTEMI 07/2013:  LHC (08/03/13): mLAD 95% assoc w/ thrombus, dLAD 50%, pRCA 80%, mRCA 50%, normal LVF. =>  PCI:  Rebel (4 x 12 mm) BMS to the mLAD and Rebel (4 x 15 mm) BMS to the pRCA.  . Osteoarthritis   . Dyslipidemia     Lipitor started at time of NSTEMI 07/2013    History  Substance Use Topics  . Smoking status: Former Smoker -- 1.50 packs/day for 15 years    Types: Cigarettes  . Smokeless tobacco: Never Used     Comment: 09/01/2013 "quit smoking in 1984"  . Alcohol Use: Yes     Comment: 2013/09/01 "hx of abuse; clean since 05/29/1981"    Family History  Problem Relation Age of Onset  . Heart failure Father   . Stroke Father   . Kidney failure Mother   . Coronary artery disease     Allergies  Allergen Reactions  . Morphine Nausea Only    OK if given slowly.  . Other Other (See Comments)    Patient states when he takes flomax and protonix together makes him break out in a rash  . Adhesive [Tape] Itching and Rash    Blisters, tears skin  . Bupropion Hives and Rash  . Flomax [Tamsulosin Hcl] Rash  When taken with protonix only patient can take this alone  . Pantoprazole Sodium Rash    When taken with flomax only patient can take this alone  . Tamsulosin Rash    When taken with protonix only patient can take this alone    OBJECTIVE: Blood pressure 118/67, pulse 67, temperature 98.4 F (36.9 C), temperature source Oral, resp. rate 18, height 5\' 9"  (1.753 m), weight 255 lb 11.7 oz (116 kg), SpO2 97 %. General: He is sitting up in a chair. He is in no distress Skin: No change in chronic, healing rash Lungs: Clear Cor: Regular S1 and S2 with no murmurs Port-A-Cath site appears normal Abdomen: Soft and nontender  Lab Results Lab Results  Component Value Date   WBC 4.9 09/01/2014   HGB 9.6* 09/01/2014   HCT 29.0* 09/01/2014   MCV 99.3 09/01/2014   PLT 102* 09/01/2014    Lab Results  Component Value Date   CREATININE 0.78 09/01/2014   BUN 16 09/01/2014   NA 139 09/01/2014   K 3.1* 09/01/2014   CL 111 09/01/2014   CO2 21 09/01/2014    Lab Results  Component  Value Date   ALT 26 08/29/2014   AST 24 08/29/2014   ALKPHOS 39 08/29/2014   BILITOT 0.7 08/29/2014     Microbiology: Recent Results (from the past 240 hour(s))  Culture, blood (routine x 2)     Status: None (Preliminary result)   Collection Time: 08/28/14  5:30 PM  Result Value Ref Range Status   Specimen Description BLOOD LEFT ANTECUBITAL  Final   Special Requests BOTTLES DRAWN AEROBIC AND ANAEROBIC 3CC  Final   Culture   Final           BLOOD CULTURE RECEIVED NO GROWTH TO DATE CULTURE WILL BE HELD FOR 5 DAYS BEFORE ISSUING A FINAL NEGATIVE REPORT Performed at [facility]    Report Status PENDING  Incomplete  Culture, blood (routine x 2)     Status: None (Preliminary result)   Collection Time: 08/28/14  5:31 PM  Result Value Ref Range Status   Specimen Description BLOOD LEFT FOREARM  Final   Special Requests BOTTLES DRAWN AEROBIC ONLY 4CC  Final   Culture   Final           BLOOD CULTURE RECEIVED NO GROWTH TO DATE CULTURE WILL BE HELD FOR 5 DAYS BEFORE ISSUING A FINAL NEGATIVE REPORT Performed at [facility]    Report Status PENDING  Incomplete  Urine culture     Status: None   Collection Time: 08/28/14  6:23 PM  Result Value Ref Range Status   Specimen Description URINE, CLEAN CATCH  Final   Special Requests NONE  Final   Colony Count NO GROWTH Performed at [facility]   Final   Culture NO GROWTH Performed at [facility]   Final   Report Status 08/29/2014 FINAL  Final  CSF culture     Status: None   Collection Time: 08/28/14  8:19 PM  Result Value Ref Range Status   Specimen Description CSF  Final   Special Requests NONE  Final   Gram Stain   Final    NO WBC SEEN NO ORGANISMS SEEN Gram Stain Report Called to,Read Back By and Verified With: Gram Stain Report Called to,Read Back By and Verified With: NURSE TRAVIA BENJAMIN 08/29/14 2:25AM [place] Performed at [facility]    Culture   Final    NO GROWTH 3 DAYS  Performed at  [facility]    Report Status 09/01/2014 FINAL  Final  Gram stain - STAT with CSF culture     Status: None   Collection Time: 08/28/14  8:19 PM  Result Value Ref Range Status   Specimen Description CSF  Final   Special Requests NONE  Final   Gram Stain   Final    NO WBC SEEN NO ORGANISMS SEEN CYTOSPIN PREPARATION NOTIFIED T.NIELSON,RN AT 2203 ON 08/28/14 BY W.SHEA    Report Status 08/28/2014 FINAL  Final  Culture, sputum-assessment     Status: None   Collection Time: 08/29/14 11:22 AM  Result Value Ref Range Status   Specimen Description SPUTUM  Final   Special Requests NONE  Final   Sputum evaluation   Final    MICROSCOPIC FINDINGS SUGGEST THAT THIS SPECIMEN IS NOT REPRESENTATIVE OF LOWER RESPIRATORY SECRETIONS. PLEASE RECOLLECT. INFORMED AVALR,E. RN @1237  ON 08/29/14 BY MCCOY,N.   Report Status 08/29/2014 FINAL  Final    Assessment: His fever has resolved on empiric antibiotics. The source of his fever remains unclear and admission blood cultures remain negative. He feels better but does not feel ready for discharge. His sister also mentions that they're worried about how they would get him from the car into the house safely given the snowfall we are having. I will keep him on vancomycin and cefepime for now.  He may have developed some melena and his hemoglobin has dropped slightly. I will have his stool checked for fecal occult blood.  Plan: 1. Continue vancomycin and cefepime for now 2. Check stool for fecal occult blood  Michel Bickers, MD [facility] [place] [phone] pager   [phone] cell 09/01/2014, 12:50 PM

## 2014-09-02 DIAGNOSIS — G4733 Obstructive sleep apnea (adult) (pediatric): Secondary | ICD-10-CM

## 2014-09-02 DIAGNOSIS — C679 Malignant neoplasm of bladder, unspecified: Secondary | ICD-10-CM

## 2014-09-02 DIAGNOSIS — C799 Secondary malignant neoplasm of unspecified site: Secondary | ICD-10-CM

## 2014-09-02 LAB — BASIC METABOLIC PANEL
Anion gap: 7 (ref 5–15)
BUN: 19 mg/dL (ref 6–23)
CO2: 27 mmol/L (ref 19–32)
Calcium: 8.2 mg/dL — ABNORMAL LOW (ref 8.4–10.5)
Chloride: 99 mmol/L (ref 96–112)
Creatinine, Ser: 1.07 mg/dL (ref 0.50–1.35)
GFR calc Af Amer: 76 mL/min — ABNORMAL LOW (ref >90)
GFR calc non Af Amer: 65 mL/min — ABNORMAL LOW (ref >90)
Glucose, Bld: 95 mg/dL (ref 70–99)
Potassium: 4.5 mmol/L (ref 3.5–5.1)
Sodium: 133 mmol/L — ABNORMAL LOW (ref 135–145)

## 2014-09-02 MED ORDER — SODIUM CHLORIDE 0.9 % IV SOLN: INTRAVENOUS | Status: DC

## 2014-09-02 MED ORDER — POTASSIUM CHLORIDE CRYS ER 20 MEQ PO TBCR
40.0000 meq | EXTENDED_RELEASE_TABLET | Freq: Once | ORAL | Status: AC
  Administered 2014-09-02: 40 meq via ORAL
  Filled 2014-09-02 (×2): qty 2

## 2014-09-02 NOTE — Progress Notes (Addendum)
Patient ID: James [name], male   DOB: 11/21/1935, 79 y.o.   MRN: [id number] [facility] PROGRESS NOTE  KAUSHIK MAUL XNA:[id number] DOB: 1935-12-24 DOA: 08/28/2014 PCP: Horatio Pel, MD  Brief narrative:    79 y.o. male with history of CAD status post stenting, atrial flutter status post ablation, metastatic bladder carcinoma, OSA on CPAP, dyslipidemia who presented to [facility] ED with sudden onset of fever, confusion. Patient apparently ate [place] the night prior to the admission. T max at home 102 F. no complaints of cough, no abdominal pain, nausea or vomiting. No reports of diarrhea. Patient has been treated for metastatic bladder carcinoma at [facility] and per patient has been on a trial drug in December 2015 at which time patient developed some rash. Then this medication was discontinued and he was placed on IV steroid and at present he is on a steroid tapering dose. He also took doxycycline for 1 week at that time.  CT head on the admission did not show acute intracranial abnormalities. Chest x-ray showed lung base opacity, left greater than the right similar to prior exam which could be chronic versus possible pneumonia. Lumbar puncture was done in ED. Patient was started on vancomycin, cefepime, acyclovir and Tamiflu. So far, CSF and blood cultures are non revealing. Tamiflu and acyclovir were stopped after 1 day of admission since influenza panel was negative and no HSV on HSV PCR.  Pt is participating in physical therapy and feels he is gaining strength gradually. He anticipates going home Monday.  Assessment/Plan:    Principal Problem: Acute metabolic encephalopathy / fever of unknown origin  - no clear source of infection identified - HSV by PCR not detected. Influenza negative. CSF and blood cultures showed no growth. Urine culture showed no growth. - HIV antibody is non reactive.  - Tamiflu and acyclovir were started on admission and stopped after 1 day of admission - Vanco  and cefepime were also started on admission. Will see with ID when should we switch to PO antibiotics.   Active Problems: OSA on CPAP - respiratory status remains stable   H/O CAD - continue coreg 6.25 mg PO BID - continue aspirin daily - no complaints of chest pain   Anemia of chronic disease - due to history of malignancy - hemoglobin stable at 9.6  Thrombocytopenia - likely from malignancy - platelets range 102-108 - no reports of bleeding   Rash - continue prednisone 10 mg daily, started 08/31/2014, change to 5 mg on 09/05/2014 for 5 days and then stop  Depression - continue Zoloft and as needed ativan   Metastatic bladder cancer - follows at [facility]  Hypokalemia - unclear etiology - supplemented - follow up BMP today   Mild hypocalcemia - unclear etiology - possible from diminished intake - started calcium supplementation 09/01/2014 - BMP this am pending   DVT Prophylaxis   SCDs bilaterally due to thrombocytopenia   Code Status: Full.  Family Communication: plan of care discussed with the patient and his family daily  Disposition Plan: Home likely Monday    IV access:  Peripheral IV  Procedures and diagnostic studies:    Ct Head Wo Contrast 08/28/2014 1. No acute intracranial abnormalities. Age related volume loss.  Dg Chest Port 1 View 08/28/2014 Lung base opacity, left greater than right, similar to the prior exam. This may all be chronic due to atelectasis/scarring. Pneumonia is possible. No other evidence of an acute abnormality. No pulmonary edema.    Medical Consultants:  Infectious disease, Dr. Michel Bickers  Other Consultants:  Physical therapy  IAnti-Infectives:   Acyclovir 08/29/2014 --> 08/29/2014 Tamiflu 08/29/2014 --> 08/29/2014  Vancomycin 08/29/2014 --> Cefepime 08/29/2014 -->   Leisa Lenz, MD  [facility] Pager [phone]  If 7PM-7AM, please contact night-coverage [email or web address] Password [facility] 09/02/2014,  7:37 AM   LOS: 5 days    HPI/Subjective: No acute overnight events.  Objective: Filed Vitals:   09/01/14 2054 09/02/14 0307 09/02/14 0416 09/02/14 0528  BP: 118/55 116/55  116/61  Pulse: 66 72  74  Temp: 98.2 F (36.8 C) 100.1 F (37.8 C) 99.4 F (37.4 C) 99.7 F (37.6 C)  TempSrc: Oral Oral Oral Oral  Resp: 18 18  18   Height:      Weight:      SpO2: 97% 96%  96%    Intake/Output Summary (Last 24 hours) at 09/02/14 0737 Last data filed at 09/02/14 3354  Gross per 24 hour  Intake   1180 ml  Output   2150 ml  Net   -970 ml    Exam:   General: pt awake, alert   Cardiovascular: Regular rate and rhythm, S1/S2 appreciated   Respiratory: clear to auscultation bilaterally, no wheezing   Abdomen: non distended, non tender abdomen  Extremities: generalized rash, no LE edema   Neuro: non focal   Data Reviewed: Basic Metabolic Panel:  Recent Labs Lab 08/28/14 1729 08/29/14 0135 09/01/14 0628  NA 131* 134* 139  K 4.1 3.9 3.1*  CL 97 100 111  CO2 24 27 21   GLUCOSE 119* 165* 79  BUN 26* 22 16  CREATININE 1.18 1.27 0.78  CALCIUM 8.2* 8.1* 6.4*   Liver Function Tests:  Recent Labs Lab 08/28/14 1729 08/29/14 0135  AST 28 24  ALT 29 26  ALKPHOS 46 39  BILITOT 0.9 0.7  PROT 6.3 5.5*  ALBUMIN 3.4* 2.7*   No results for input(s): LIPASE, AMYLASE in the last 168 hours. No results for input(s): AMMONIA in the last 168 hours. CBC:  Recent Labs Lab 08/28/14 1729 08/29/14 0135 09/01/14 0628  WBC 6.6 5.6 4.9  NEUTROABS 5.4 4.7  --   HGB 12.7* 11.7* 9.6*  HCT 37.8* 35.8* 29.0*  MCV 99.2 101.7* 99.3  PLT 116* 108* 102*   Cardiac Enzymes:  Recent Labs Lab 08/29/14 0135  TROPONINI <0.03   BNP: Invalid input(s): POCBNP CBG: No results for input(s): GLUCAP in the last 168 hours.  Recent Results (from the past 240 hour(s))  Culture, blood (routine x 2)     Status: None (Preliminary result)   Collection Time: 08/28/14  5:30 PM  Result Value  Ref Range Status   Specimen Description BLOOD LEFT ANTECUBITAL  Final   Culture   Final           BLOOD CULTURE RECEIVED NO GROWTH TO DATE    Report Status PENDING  Incomplete  Culture, blood (routine x 2)     Status: None (Preliminary result)   Collection Time: 08/28/14  5:31 PM  Result Value Ref Range Status   Specimen Description BLOOD LEFT FOREARM  Final   Culture   Final           BLOOD CULTURE RECEIVED NO GROWTH TO DATE     Report Status PENDING  Incomplete  Urine culture     Status: None   Collection Time: 08/28/14  6:23 PM  Result Value Ref Range Status   Specimen Description URINE, CLEAN CATCH  Final   Special Requests  NONE  Final   Culture NO GROWTH  Final   Report Status 08/29/2014 FINAL  Final  CSF culture     Status: None   Collection Time: 08/28/14  8:19 PM  Result Value Ref Range Status   Specimen Description CSF  Final   Special Requests NONE  Final   Gram Stain   Final    NO WBC SEEN NO ORGANISMS SEEN    Culture   Final    NO GROWTH 3 DAYS Performed at [facility]    Report Status 09/01/2014 FINAL  Final  Gram stain - STAT with CSF culture     Status: None   Collection Time: 08/28/14  8:19 PM  Result Value Ref Range Status   Specimen Description CSF  Final   Special Requests NONE  Final   Gram Stain   Final    NO WBC SEEN NO ORGANISMS SEEN   Report Status 08/28/2014 FINAL  Final  Culture, sputum-assessment     Status: None   Collection Time: 08/29/14 11:22 AM  Result Value Ref Range Status   Specimen Description SPUTUM  Final   Special Requests NONE  Final   Sputum evaluation   Final    MICROSCOPIC FINDINGS SUGGEST THAT THIS SPECIMEN IS NOT REPRESENTATIVE OF LOWER RESPIRATORY SECRETIONS. PLEASE RECOLLECT. INFORMED AVALR,E. RN @1237  ON 08/29/14 BY MCCOY,N.   Report Status 08/29/2014 FINAL  Final     Scheduled Meds: . aspirin  81 mg Oral Daily  . atorvastatin  40 mg Oral q1800  . calcium carbonate  1,000 mg of elemental calcium Oral BID  WC  . carvedilol  6.25 mg Oral BID WC  . ceFEPime (MAXIPIME) IV  1 g Intravenous Q8H  . ferrous sulfate  325 mg Oral BID WC  . predniSONE  10 mg Oral Q breakfast  . sertraline  150 mg Oral Daily  . vancomycin  750 mg Intravenous Q12H  . venlafaxine XR  150 mg Oral Daily   Continuous Infusions: . sodium chloride 50 mL/hr at 09/02/14 0518

## 2014-09-02 NOTE — Progress Notes (Signed)
INFECTIOUS DISEASE PROGRESS NOTE  ID: James [name] is a 79 y.o. male with  Principal Problem:   Fever Active Problems:   Sleep apnea   Coronary atherosclerosis of native coronary artery   Acute encephalopathy   Thrombocytopenia   Normocytic anemia   Obstructive sleep apnea   Metastasis from bladder cancer   Drug-induced skin rash   Cutaneous lupus erythematosus   Steroid-induced hyperglycemia   FUO (fever of unknown origin)  Subjective: C/o low grade temp, feeling run down.   Abtx:  Anti-infectives    Start     Dose/Rate Route Frequency Ordered Stop   09/02/14 0000  vancomycin (VANCOCIN) IVPB 750 mg/150 ml premix     750 mg150 mL/hr over 60 Minutes Intravenous Every 12 hours 09/01/14 1409     08/30/14 0200  vancomycin (VANCOCIN) 1,250 mg in sodium chloride 0.9 % 250 mL IVPB  Status:  Discontinued     1,250 mg166.7 mL/hr over 90 Minutes Intravenous Every 24 hours 08/29/14 0105 09/01/14 1409   08/29/14 0500  acyclovir (ZOVIRAX) 700 mg in dextrose 5 % 100 mL IVPB  Status:  Discontinued     700 mg114 mL/hr over 60 Minutes Intravenous Every 8 hours 08/29/14 0446 08/29/14 1633   08/29/14 0200  vancomycin (VANCOCIN) 2,000 mg in sodium chloride 0.9 % 500 mL IVPB     2,000 mg250 mL/hr over 120 Minutes Intravenous  Once 08/29/14 0105 08/29/14 0347   08/29/14 0100  ceFEPIme (MAXIPIME) 1 g in dextrose 5 % 50 mL IVPB     1 g100 mL/hr over 30 Minutes Intravenous Every 8 hours 08/29/14 0055 09/05/14 2359   08/28/14 2215  oseltamivir (TAMIFLU) capsule 75 mg  Status:  Discontinued     75 mg Oral 2 times daily 08/28/14 2210 08/29/14 1402      Medications:  Scheduled: . aspirin  81 mg Oral Daily  . atorvastatin  40 mg Oral q1800  . calcium carbonate  1,000 mg of elemental calcium Oral BID WC  . carvedilol  6.25 mg Oral BID WC  . ceFEPime (MAXIPIME) IV  1 g Intravenous Q8H  . ferrous sulfate  325 mg Oral BID WC  . predniSONE  10 mg Oral Q breakfast  . sertraline  150 mg Oral Daily  .  vancomycin  750 mg Intravenous Q12H  . venlafaxine XR  150 mg Oral Daily    Objective: Vital signs in last 24 hours: Temp:  [98.2 F (36.8 C)-100.1 F (37.8 C)] 98.2 F (36.8 C) (01/23 1325) Pulse Rate:  [66-74] 74 (01/23 0528) Resp:  [18] 18 (01/23 0528) BP: (116-118)/(55-61) 116/61 mmHg (01/23 0528) SpO2:  [96 %-97 %] 96 % (01/23 0528)   General appearance: alert, cooperative and no distress Resp: rhonchi bilaterally Chest wall: no tenderness, R sided port site clean.  Cardio: regular rate and rhythm GI: normal findings: bowel sounds normal and soft, non-tender Extremities: edema none  Lab Results  Recent Labs  09/01/14 0628 09/02/14 0816  WBC 4.9  --   HGB 9.6*  --   HCT 29.0*  --   NA 139 133*  K 3.1* 4.5  CL 111 99  CO2 21 27  BUN 16 19  CREATININE 0.78 1.07   Liver Panel No results for input(s): PROT, ALBUMIN, AST, ALT, ALKPHOS, BILITOT, BILIDIR, IBILI in the last 72 hours. Sedimentation Rate No results for input(s): ESRSEDRATE in the last 72 hours. C-Reactive Protein No results for input(s): CRP in the last 72 hours.  Microbiology: Recent Results (from the past 240 hour(s))  Culture, blood (routine x 2)     Status: None (Preliminary result)   Collection Time: 08/28/14  5:30 PM  Result Value Ref Range Status   Specimen Description BLOOD LEFT ANTECUBITAL  Final   Special Requests BOTTLES DRAWN AEROBIC AND ANAEROBIC 3CC  Final   Culture   Final           BLOOD CULTURE RECEIVED NO GROWTH TO DATE CULTURE WILL BE HELD FOR 5 DAYS BEFORE ISSUING A FINAL NEGATIVE REPORT Performed at [facility]    Report Status PENDING  Incomplete  Culture, blood (routine x 2)     Status: None (Preliminary result)   Collection Time: 08/28/14  5:31 PM  Result Value Ref Range Status   Specimen Description BLOOD LEFT FOREARM  Final   Special Requests BOTTLES DRAWN AEROBIC ONLY 4CC  Final   Culture   Final           BLOOD CULTURE RECEIVED NO GROWTH TO DATE CULTURE  WILL BE HELD FOR 5 DAYS BEFORE ISSUING A FINAL NEGATIVE REPORT Performed at [facility]    Report Status PENDING  Incomplete  Urine culture     Status: None   Collection Time: 08/28/14  6:23 PM  Result Value Ref Range Status   Specimen Description URINE, CLEAN CATCH  Final   Special Requests NONE  Final   Colony Count NO GROWTH Performed at [facility]   Final   Culture NO GROWTH Performed at [facility]   Final   Report Status 08/29/2014 FINAL  Final  CSF culture     Status: None   Collection Time: 08/28/14  8:19 PM  Result Value Ref Range Status   Specimen Description CSF  Final   Special Requests NONE  Final   Gram Stain   Final    NO WBC SEEN NO ORGANISMS SEEN Gram Stain Report Called to,Read Back By and Verified With: Gram Stain Report Called to,Read Back By and Verified With: NURSE TRAVIA BENJAMIN 08/29/14 2:25AM [place] Performed at [facility]    Culture   Final    NO GROWTH 3 DAYS Performed at [facility]    Report Status 09/01/2014 FINAL  Final  Gram stain - STAT with CSF culture     Status: None   Collection Time: 08/28/14  8:19 PM  Result Value Ref Range Status   Specimen Description CSF  Final   Special Requests NONE  Final   Gram Stain   Final    NO WBC SEEN NO ORGANISMS SEEN CYTOSPIN PREPARATION NOTIFIED T.NIELSON,RN AT 2203 ON 08/28/14 BY W.SHEA    Report Status 08/28/2014 FINAL  Final  Culture, sputum-assessment     Status: None   Collection Time: 08/29/14 11:22 AM  Result Value Ref Range Status   Specimen Description SPUTUM  Final   Special Requests NONE  Final   Sputum evaluation   Final    MICROSCOPIC FINDINGS SUGGEST THAT THIS SPECIMEN IS NOT REPRESENTATIVE OF LOWER RESPIRATORY SECRETIONS. PLEASE RECOLLECT. INFORMED AVALR,E. RN @1237  ON 08/29/14 BY MCCOY,N.   Report Status 08/29/2014 FINAL  Final    Studies/Results: No results found.   Assessment/Plan: Metastatic bladder cancer Febrile illness ?  LLL pna Anemia ADR drug reaction, long standing Steroid use  Total days of antibiotics: 5 vanco/cefepime  Will continue to watch him on his current anbx I am not sure what his fever source is. HCAP? His Cx,  CSF, PCR are unremarkable.  His port does not look infected and I am not sure that repeating his Cx while on vanco will be helpful FOBT (-)         Bobby Rumpf Infectious Diseases (pager) [phone] [email or web address] 09/02/2014, 2:02 PM  LOS: 5 days

## 2014-09-02 NOTE — Plan of Care (Signed)
Problem: Phase III Progression Outcomes Goal: Voiding independently Outcome: Completed/Met Date Met:  09/02/14 Patient uses urinal to void.     

## 2014-09-02 NOTE — Progress Notes (Signed)
Sodium level of 133 texted to dr. Charlies Silvers and new order received.Sandie Ano RN

## 2014-09-03 LAB — BASIC METABOLIC PANEL
ANION GAP: 9 (ref 5–15)
BUN: 20 mg/dL (ref 6–23)
CO2: 27 mmol/L (ref 19–32)
CREATININE: 1.05 mg/dL (ref 0.50–1.35)
Calcium: 8.4 mg/dL (ref 8.4–10.5)
Chloride: 98 mmol/L (ref 96–112)
GFR calc Af Amer: 78 mL/min — ABNORMAL LOW (ref >90)
GFR calc non Af Amer: 67 mL/min — ABNORMAL LOW (ref >90)
Glucose, Bld: 99 mg/dL (ref 70–99)
POTASSIUM: 4.3 mmol/L (ref 3.5–5.1)
SODIUM: 134 mmol/L — AB (ref 135–145)

## 2014-09-03 NOTE — Progress Notes (Signed)
Patient ID: James [name], male   DOB: 04-22-1936, 79 y.o.   MRN: [id number] [facility] PROGRESS NOTE  James [name] EGB:[id number] DOB: Sep 20, 1935 DOA: 08/28/2014 PCP: Horatio Pel, MD  Brief narrative:    79 y.o. male with history of CAD status post stenting, atrial flutter status post ablation, metastatic bladder carcinoma, OSA on CPAP, dyslipidemia who presented to [name] ED with sudden onset of fever, confusion. Patient apparently ate [place] the night prior to the admission. T max at home 102 F. no complaints of cough, no abdominal pain, nausea or vomiting. No reports of diarrhea. Patient has been treated for metastatic bladder carcinoma at [facility] and per patient has been on a trial drug in December 2015 at which time patient developed some rash. Then this medication was discontinued and he was placed on IV steroid and at present he is on a steroid tapering dose. He also took doxycycline for 1 week at that time.  CT head on the admission did not show acute intracranial abnormalities. Chest x-ray showed lung base opacity, left greater than the right similar to prior exam which could be chronic versus possible pneumonia. Lumbar puncture was done in ED. Patient was started on vancomycin, cefepime, acyclovir and Tamiflu. So far, CSF and blood cultures are non revealing. Tamiflu and acyclovir were stopped after 1 day of admission since influenza panel was negative and no HSV on HSV PCR.   Assessment/Plan:     Principal Problem: Acute metabolic encephalopathy / fever of unknown origin  - no clear source of infection identified; no fever in past 48 hours. Hopefully will have pt transitioned to PO abx today or tomorrow, will follow up with ID. - HSV by PCR not detected. Influenza negative. CSF and blood cultures showed no growth. Urine culture showed no growth. - HIV antibody is non reactive.  - Tamiflu and acyclovir were started on admission and stopped after 1 day of admission -  Continue Vanco and Cefepime, day #6.   Active Problems: OSA on CPAP - respiratory status rtable - on CPAP  H/O CAD - continue coreg 6.25 mg PO BID and aspirin daily  Anemia of chronic disease - due to history of malignancy - hemoglobin stable at 9.6  Thrombocytopenia - likely from malignancy - platelets range 102-108 - no reports of bleeding   Rash - continue prednisone 10 mg daily, started 08/31/2014, will change to 5 mg on 09/05/2014 for 5 days and then stop  Depression - continue Zoloft and as needed ativan   Metastatic bladder cancer - follows at [facility]  Hypokalemia - unclear etiology - supplemented and WNL  Mild hypocalcemia - unclear etiology - possible from diminished intake - started calcium supplementation 09/01/2014 - calcium WNL  DVT Prophylaxis   SCDs bilaterally due to thrombocytopenia   Code Status: Full.  Family Communication: plan of care discussed with the patient and his family daily  Disposition Plan: Home likely Monday with HH orders placed    IV access:  Peripheral IV  Procedures and diagnostic studies:   Ct Head Wo Contrast 08/28/2014 1. No acute intracranial abnormalities. Age related volume loss.  Dg Chest Port 1 View 08/28/2014 Lung base opacity, left greater than right, similar to the prior exam. This may all be chronic due to atelectasis/scarring. Pneumonia is possible. No other evidence of an acute abnormality. No pulmonary edema.    Medical Consultants:  Infectious disease, Dr. Michel Bickers  Other Consultants:  Physical therapy  IAnti-Infectives:   Acyclovir 08/29/2014 -->  08/29/2014 Tamiflu 08/29/2014 --> 08/29/2014  Vancomycin 08/29/2014 --> Cefepime 08/29/2014 -->    Leisa Lenz, MD  [facility] Pager [phone]  If 7PM-7AM, please contact night-coverage [email or web address] Password [email or web address] 09/03/2014, 11:40 AM   LOS: 6 days    HPI/Subjective: No acute overnight  events.  Objective: Filed Vitals:   09/02/14 1325 09/02/14 1440 09/02/14 2115 09/03/14 0736  BP:  116/68 130/64 112/68  Pulse:  68 78 73  Temp: 98.2 F (36.8 C) 98.7 F (37.1 C) 98.6 F (37 C) 98.6 F (37 C)  TempSrc:  Oral Oral Oral  Resp:  18 18 16   Height:      Weight:      SpO2:  100% 94% 97%    Intake/Output Summary (Last 24 hours) at 09/03/14 1140 Last data filed at 09/03/14 1104  Gross per 24 hour  Intake 1236.33 ml  Output   4851 ml  Net -3614.67 ml    Exam:   General: pt is sleeping, on CPAP  Cardiovascular: Regular rate and rhythm, S1/S2 appreciated   Respiratory: clear to auscultation bilaterally, no wheezing   Abdomen: non distended, non tender abdomen  Extremities: generalized rash, no LE edema   Neuro: non focal  Data Reviewed: Basic Metabolic Panel:  Recent Labs Lab 08/28/14 1729 08/29/14 0135 09/01/14 0628 09/02/14 0816 09/03/14 0620  NA 131* 134* 139 133* 134*  K 4.1 3.9 3.1* 4.5 4.3  CL 97 100 111 99 98  CO2 24 27 21 27 27   GLUCOSE 119* 165* 79 95 99  BUN 26* 22 16 19 20   CREATININE 1.18 1.27 0.78 1.07 1.05  CALCIUM 8.2* 8.1* 6.4* 8.2* 8.4   Liver Function Tests:  Recent Labs Lab 08/28/14 1729 08/29/14 0135  AST 28 24  ALT 29 26  ALKPHOS 46 39  BILITOT 0.9 0.7  PROT 6.3 5.5*  ALBUMIN 3.4* 2.7*   No results for input(s): LIPASE, AMYLASE in the last 168 hours. No results for input(s): AMMONIA in the last 168 hours. CBC:  Recent Labs Lab 08/28/14 1729 08/29/14 0135 09/01/14 0628  WBC 6.6 5.6 4.9  NEUTROABS 5.4 4.7  --   HGB 12.7* 11.7* 9.6*  HCT 37.8* 35.8* 29.0*  MCV 99.2 101.7* 99.3  PLT 116* 108* 102*   Cardiac Enzymes:  Recent Labs Lab 08/29/14 0135  TROPONINI <0.03   BNP: Invalid input(s): POCBNP CBG: No results for input(s): GLUCAP in the last 168 hours.  Recent Results (from the past 240 hour(s))  Culture, blood (routine x 2)     Status: None (Preliminary result)   Collection Time:  08/28/14  5:30 PM  Result Value Ref Range Status   Specimen Description BLOOD LEFT ANTECUBITAL  Final   Special Requests BOTTLES DRAWN AEROBIC AND ANAEROBIC 3CC  Final   Culture   Final           BLOOD CULTURE RECEIVED NO GROWTH TO DATE CULTURE WILL BE HELD FOR 5 DAYS BEFORE ISSUING A FINAL NEGATIVE REPORT Performed at [facility]    Report Status PENDING  Incomplete  Culture, blood (routine x 2)     Status: None (Preliminary result)   Collection Time: 08/28/14  5:31 PM  Result Value Ref Range Status   Specimen Description BLOOD LEFT FOREARM  Final   Special Requests BOTTLES DRAWN AEROBIC ONLY 4CC  Final   Culture   Final           BLOOD CULTURE RECEIVED NO GROWTH TO DATE CULTURE WILL BE HELD FOR  5 DAYS BEFORE ISSUING A FINAL NEGATIVE REPORT Performed at [facility]    Report Status PENDING  Incomplete  Urine culture     Status: None   Collection Time: 08/28/14  6:23 PM  Result Value Ref Range Status   Specimen Description URINE, CLEAN CATCH  Final   Special Requests NONE  Final   Colony Count NO GROWTH Performed at [facility]   Final   Culture NO GROWTH Performed at [facility]   Final   Report Status 08/29/2014 FINAL  Final  CSF culture     Status: None   Collection Time: 08/28/14  8:19 PM  Result Value Ref Range Status   Specimen Description CSF  Final   Special Requests NONE  Final   Gram Stain   Final    NO WBC SEEN NO ORGANISMS SEEN Gram Stain Report Called to,Read Back By and Verified With: Gram Stain Report Called to,Read Back By and Verified With: NURSE TRAVIA BENJAMIN 08/29/14 2:25AM [place] Performed at [facility]    Culture   Final    NO GROWTH 3 DAYS Performed at [facility]    Report Status 09/01/2014 FINAL  Final  Gram stain - STAT with CSF culture     Status: None   Collection Time: 08/28/14  8:19 PM  Result Value Ref Range Status   Specimen Description CSF  Final   Special Requests NONE  Final    Gram Stain   Final    NO WBC SEEN NO ORGANISMS SEEN CYTOSPIN PREPARATION NOTIFIED T.NIELSON,RN AT 2203 ON 08/28/14 BY W.SHEA    Report Status 08/28/2014 FINAL  Final  Culture, sputum-assessment     Status: None   Collection Time: 08/29/14 11:22 AM  Result Value Ref Range Status   Specimen Description SPUTUM  Final   Special Requests NONE  Final   Sputum evaluation   Final    MICROSCOPIC FINDINGS SUGGEST THAT THIS SPECIMEN IS NOT REPRESENTATIVE OF LOWER RESPIRATORY SECRETIONS. PLEASE RECOLLECT. INFORMED AVALR,E. RN @1237  ON 08/29/14 BY MCCOY,N.   Report Status 08/29/2014 FINAL  Final     Scheduled Meds: . aspirin  81 mg Oral Daily  . atorvastatin  40 mg Oral q1800  . calcium carbonate  1,000 mg of elemental calcium Oral BID WC  . carvedilol  6.25 mg Oral BID WC  . ceFEPime (MAXIPIME) IV  1 g Intravenous Q8H  . ferrous sulfate  325 mg Oral BID WC  . predniSONE  10 mg Oral Q breakfast  . sertraline  150 mg Oral Daily  . vancomycin  750 mg Intravenous Q12H  . venlafaxine XR  150 mg Oral Daily   Continuous Infusions: . sodium chloride 20 mL/hr at 09/02/14 1231

## 2014-09-03 NOTE — Progress Notes (Signed)
Pt has home CPAP and places himself on and off when ready. RT instructed patient to call if he needed any assistance

## 2014-09-03 NOTE — Plan of Care (Signed)
Problem: Phase III Progression Outcomes Goal: Activity at appropriate level-compared to baseline (UP IN CHAIR FOR HEMODIALYSIS)  Outcome: Progressing Patient ambulates in the hallway with four wheeled walker.-

## 2014-09-03 NOTE — Progress Notes (Signed)
Physical Therapy Treatment Patient Details Name: James [name] MRN: [id number] DOB: [birth year] Today's Date: 09/03/2014    History of Present Illness James [name] is a 79 y.o. male with history of CAD status post stenting, atrial flutter status post ablation, metastatic bladder carcinoma, OSA on C Pap, dyslipidemia presents to the ER because of weakness and confusion and fever.    PT Comments    Patient has questions regarding need for wider RW, family looking into choices and if medicare covers. p will practice steps in AM.  Follow Up Recommendations  Home health PT;Supervision for mobility/OOB     Equipment Recommendations   (wants a wide 4 wheeled RW to check on  insurance coverage, smaller  one has bolts that dig into skin )    Recommendations for Other Services       Precautions / Restrictions Precautions Precautions: Fall    Mobility  Bed Mobility                  Transfers                    Ambulation/Gait             General Gait Details: patient has ambulated today.   Stairs            Wheelchair Mobility    Modified Rankin (Stroke Patients Only)       Balance                                    Cognition Arousal/Alertness: Awake/alert Behavior During Therapy: WFL for tasks assessed/performed Overall Cognitive Status: Within Functional Limits for tasks assessed                      Exercises Total Joint Exercises Ankle Circles/Pumps: AROM;Both;10 reps;Supine Quad Sets: AROM;Both;10 reps Gluteal Sets: AROM;Both;10 reps Short Arc Quad: AROM;Both;10 reps Heel Slides: AROM;Both;10 reps Hip ABduction/ADduction: AROM;Both;10 reps    General Comments        Pertinent Vitals/Pain Pain Score: 3  Pain Location: general Pain Intervention(s): Monitored during session    Home Living                      Prior Function            PT Goals (current goals can now be found in the care plan  section) Progress towards PT goals: Progressing toward goals    Frequency  Min 3X/week    PT Plan Current plan remains appropriate    Co-evaluation             End of Session   Activity Tolerance: Patient tolerated treatment well Patient left: in bed;with call bell/phone within reach;with nursing/sitter in room;with family/visitor present     Time: 7356-7014 PT Time Calculation (min) (ACUTE ONLY): 15 min  Charges:  $Therapeutic Exercise: 8-22 mins                    G Codes:      Claretha Cooper 09/03/2014, 4:53 PM Tresa Endo PT [phone]

## 2014-09-04 LAB — CULTURE, BLOOD (ROUTINE X 2)
Culture: NO GROWTH
Culture: NO GROWTH

## 2014-09-04 MED ORDER — LEVOFLOXACIN 750 MG PO TABS: 750.0000 mg | ORAL_TABLET | Freq: Every day | ORAL | Status: DC

## 2014-09-04 MED ORDER — TRAMADOL HCL 50 MG PO TABS: 50.0000 mg | ORAL_TABLET | Freq: Four times a day (QID) | ORAL | Status: DC | PRN

## 2014-09-04 MED ORDER — PREDNISONE 5 MG PO TABS: 5.0000 mg | ORAL_TABLET | Freq: Every day | ORAL | Status: DC

## 2014-09-04 MED ORDER — LORAZEPAM 1 MG PO TABS: 1.0000 mg | ORAL_TABLET | Freq: Two times a day (BID) | ORAL | Status: DC | PRN

## 2014-09-04 MED ORDER — CALCIUM CARBONATE 1250 (500 CA) MG PO TABS: 1.0000 | ORAL_TABLET | Freq: Two times a day (BID) | ORAL | Status: DC

## 2014-09-04 NOTE — Discharge Summary (Addendum)
Physician Discharge Summary  James [name] ZOX:[id number] DOB: 1935/12/30 DOA: 08/28/2014  PCP: Horatio Pel, MD  Admit date: 08/28/2014 Discharge date: 09/04/2014  Recommendations for Outpatient Follow-up:  1. Takes calcium for another 7 days on discharge. Please have your primary care physician recheck her calcium level. 2. Please take Levaquin for 7 days on discharge. 3. Home health orders in place.   Discharge Diagnoses:  Principal Problem:   Fever Active Problems:   Sleep apnea   Coronary atherosclerosis of native coronary artery   Acute encephalopathy   Thrombocytopenia   Normocytic anemia   Obstructive sleep apnea   Metastasis from bladder cancer   Drug-induced skin rash   Cutaneous lupus erythematosus   Steroid-induced hyperglycemia   FUO (fever of unknown origin)    Discharge Condition: stable   Diet recommendation: as tolerated   History of present illness:  79 y.o. male with history of CAD status post stenting, atrial flutter status post ablation, metastatic bladder carcinoma, OSA on CPAP, dyslipidemia who presented to [facility] ED with sudden onset of fever, confusion. Patient apparently ate [place] the night prior to the admission. T max at home 102 F. no complaints of cough, no abdominal pain, nausea or vomiting. No reports of diarrhea. Patient has been treated for metastatic bladder carcinoma at [facility] and per patient has been on a trial drug in December 2015 at which time patient developed some rash. Then this medication was discontinued and he was placed on IV steroid and at present he is on a steroid tapering dose. He also took doxycycline for 1 week at that time.  CT head on the admission did not show acute intracranial abnormalities. Chest x-ray showed lung base opacity, left greater than the right similar to prior exam which could be chronic versus possible pneumonia. Lumbar puncture was done in ED. Patient was started on vancomycin, cefepime, acyclovir and  Tamiflu. So far, CSF and blood cultures are non revealing. Tamiflu and acyclovir were stopped after 1 day of admission since influenza panel was negative and no HSV on HSV PCR.   Assessment/Plan:     Principal Problem: Acute metabolic encephalopathy / fever of unknown origin  - no clear source of infection identified; no fever in past 72 hours. - Patient was started on broad-spectrum antibiotics, vancomycin and cefepime, today is day 7 of those antibiotics. We will transition to Levaquin for 7 days on discharge. - HSV by PCR not detected. Influenza negative. CSF and blood cultures showed no growth. Urine culture showed no growth. - HIV antibody is non reactive.  - Tamiflu and acyclovir were started on admission and stopped after 1 day of admission   Active Problems: OSA on CPAP - respiratory status rtable - Continue Cipro at home  H/O CAD - continue coreg 6.25 mg PO BID and aspirin daily  Anemia of chronic disease - due to history of malignancy - hemoglobin stable at 9.6  Thrombocytopenia - likely from malignancy - platelets range 102-108 - no reports of bleeding   Rash - Patient will continue 5 more days of prednisone 5 mg daily and then stop. He was on prednisone taper regimen prior to hospital stay and was on prednisone during the hospital stay, tapering regimen.  Depression - continue Zoloft and as needed ativan   Metastatic bladder cancer - follows at [facility]  Hypokalemia - unclear etiology - supplemented and WNL  Mild hypocalcemia - Unclear ideology but has improved with calcium supplementation - calcium WNL; patient will continue 7 more  days off calcium on discharge  DVT Prophylaxis   SCDs bilaterally due to thrombocytopenia   Code Status: Full.  Family Communication: plan of care discussed with the patient and his family daily    IV access:  Peripheral IV  Procedures and diagnostic studies:   Ct Head Wo Contrast 08/28/2014 1. No  acute intracranial abnormalities. Age related volume loss.  Dg Chest Port 1 View 08/28/2014 Lung base opacity, left greater than right, similar to the prior exam. This may all be chronic due to atelectasis/scarring. Pneumonia is possible. No other evidence of an acute abnormality. No pulmonary edema.    Medical Consultants:  Infectious disease, Dr. Michel Bickers  Other Consultants:  Physical therapy  IAnti-Infectives:   Acyclovir 08/29/2014 --> 08/29/2014 Tamiflu 08/29/2014 --> 08/29/2014  Vancomycin 08/29/2014 --> 09/04/2014  Cefepime 08/29/2014 --> 09/04/2014    Signed:  Leisa Lenz, MD  [facility] 09/04/2014, 10:49 AM  Pager #: [phone]  I have spent 75 minutes coordinating discharge summary for this patient. This includes direct patient care along with updating the family members, coordinating home health orders with case mangers and completing discharge summary.    Discharge Exam: Filed Vitals:   09/04/14 0511  BP: 123/58  Pulse: 71  Temp: 98.5 F (36.9 C)  Resp: 17   Filed Vitals:   09/03/14 1402 09/03/14 1727 09/03/14 2127 09/04/14 0511  BP: 116/55 128/67 126/65 123/58  Pulse: 66  77 71  Temp: 98.3 F (36.8 C) 98.6 F (37 C) 98.2 F (36.8 C) 98.5 F (36.9 C)  TempSrc: Oral Oral Oral Oral  Resp: 16 17 17 17   Height:      Weight:      SpO2: 93% 95% 91% 95%    General: Pt is alert, follows commands appropriately, not in acute distress Cardiovascular: Regular rate and rhythm, S1/S2 +, no murmurs Respiratory: Clear to auscultation bilaterally, no wheezing, no crackles, no rhonchi Abdominal: Soft, non tender, non distended, bowel sounds +, no guarding Extremities: rash generalized, chronic, no cyanosis, pulses palpable bilaterally DP and PT Neuro: Grossly nonfocal  Discharge Instructions  Discharge Instructions    (HEART FAILURE PATIENTS) Call MD:  Anytime you have any of the following symptoms: 1) 3 pound weight gain in 24  hours or 5 pounds in 1 week 2) shortness of breath, with or without a dry hacking cough 3) swelling in the hands, feet or stomach 4) if you have to sleep on extra pillows at night in order to breathe.    Complete by:  As directed      Call MD for:  difficulty breathing, headache or visual disturbances    Complete by:  As directed      Call MD for:  persistant nausea and vomiting    Complete by:  As directed      Call MD for:  severe uncontrolled pain    Complete by:  As directed      Diet - low sodium heart healthy    Complete by:  As directed      Discharge instructions    Complete by:  As directed   1. Takes calcium for another 7 days on discharge. Please have your primary care physician recheck her calcium level. 2. Please take Levaquin for 7 days on discharge.     Increase activity slowly    Complete by:  As directed             Medication List    TAKE these medications  acetaminophen 500 MG tablet  Commonly known as:  TYLENOL  Take 500 mg by mouth every 6 (six) hours as needed for fever.     antiseptic oral rinse Liqd  15 mLs by Mouth Rinse route as needed for dry mouth.     aspirin 81 MG tablet  Take 81 mg by mouth daily.     atorvastatin 40 MG tablet  Commonly known as:  LIPITOR  TAKE 1 TABLET (40 MG TOTAL) BY MOUTH DAILY.     calcium carbonate 1250 (500 CA) MG tablet  Commonly known as:  OS-CAL - dosed in mg of elemental calcium  Take 1 tablet (500 mg of elemental calcium total) by mouth 2 (two) times daily with a meal.     carvedilol 6.25 MG tablet  Commonly known as:  COREG  Take 1 tablet (6.25 mg total) by mouth 2 (two) times daily.     DECADRON PO  Take 1 tablet by mouth daily as needed (after chemo treatment).     ferrous sulfate 325 (65 FE) MG tablet  Take 325 mg by mouth 2 (two) times daily with a meal.     Garlic 858 MG Caps  Take 300 mg by mouth daily.     levofloxacin 750 MG tablet  Commonly known as:  LEVAQUIN  Take 1 tablet (750 mg  total) by mouth daily.     lidocaine-prilocaine cream  Commonly known as:  EMLA  Apply topically as needed for port     loperamide 2 MG tablet  Commonly known as:  IMODIUM A-D  Take 2 mg by mouth 3 (three) times daily as needed for diarrhea or loose stools.     LORazepam 1 MG tablet  Commonly known as:  ATIVAN  Take 1 tablet (1 mg total) by mouth every 12 (twelve) hours as needed for anxiety.     MULTIPLE VITAMIN PO  Take by mouth daily.     NETTLE LEAF PO  Take 300 mg by mouth daily.     nitroGLYCERIN 0.4 MG SL tablet  Commonly known as:  NITROSTAT  Place 0.4 mg under the tongue every 5 (five) minutes as needed for chest pain.     OMEGA 3 PO  Take 1 capsule by mouth 2 (two) times daily. [phone] mg     OVER THE COUNTER MEDICATION  Broccamax 380mg  daily     OVER THE COUNTER MEDICATION  [place] 1100mg  daily     OVER THE COUNTER MEDICATION  Cell Builder Inistol daily     predniSONE 5 MG tablet  Commonly known as:  DELTASONE  Take 1 tablet (5 mg total) by mouth daily with breakfast.     PROBIOTIC & ACIDOPHILUS EX ST PO  Take by mouth daily.     sertraline 100 MG tablet  Commonly known as:  ZOLOFT  Take 150 mg by mouth daily.     traMADol 50 MG tablet  Commonly known as:  ULTRAM  Take 1 tablet (50 mg total) by mouth every 6 (six) hours as needed for moderate pain.     triamcinolone cream 0.1 %  Commonly known as:  KENALOG  Apply 1 application topically daily as needed (for itching).     venlafaxine XR 150 MG 24 hr capsule  Commonly known as:  EFFEXOR-XR  Take 150 mg by mouth daily.           Follow-up Information    Follow up with Horatio Pel, MD. Schedule an appointment as soon as possible  for a visit in 1 week.   Specialty:  Internal Medicine   Why:  Follow up appt after recent hospitalization   Contact information:   [address] [place] [id number] [phone]        The results of significant diagnostics from  this hospitalization (including imaging, microbiology, ancillary and laboratory) are listed below for reference.    Significant Diagnostic Studies: Ct Head Wo Contrast  08/28/2014   CLINICAL DATA:  Confusion beginning this afternoon. Fever. History of bladder carcinoma.  EXAM: CT HEAD WITHOUT CONTRAST  TECHNIQUE: Contiguous axial images were obtained from the base of the skull through the vertex without intravenous contrast.  COMPARISON:  01/17/2011  FINDINGS: Ventricles are normal in configuration. There is age related mild ventricular and sulcal enlargement. No hydrocephalus.  No parenchymal masses or mass effect. There is no evidence of a cortical infarct.  No extra-axial masses or abnormal fluid collections.  There is no intracranial hemorrhage.  Sinuses and mastoid air cells are clear.  No skull lesion.  IMPRESSION: 1. No acute intracranial abnormalities.  Age related volume loss.   Electronically Signed   By: Lajean Manes M.D.   On: 08/28/2014 19:16   Dg Chest Port 1 View  08/28/2014   CLINICAL DATA:  . Patient states that he had "a chill" when he laid down today for a nap and was not easily aroused when by his daughters who states that he was confused. Answering all questions appropriate. Grip strength and leg strength equal bilaterally. Patient is SOB while speaking. Just taken off of a cancer trial for bladder ca, hx pna, cad, ex-smoker  EXAM: PORTABLE CHEST - 1 VIEW  COMPARISON:  09/09/2013  FINDINGS: There is lung base patchy airspace and interstitial opacity more evident on the left. This is similar to the prior study, may all reflect chronic scarring and atelectasis. Pneumonia is possible.  No pulmonary edema.  No pleural effusion or pneumothorax.  Cardiac silhouette is normal size. No mediastinal or hilar masses. Right anterior chest wall Port-A-Cath is stable with its tip just above the caval atrial junction.  Bony thorax is demineralized but grossly intact.  IMPRESSION: Lung base opacity,  left greater than right, similar to the prior exam. This may all be chronic due to atelectasis/scarring. Pneumonia is possible.  No other evidence of an acute abnormality.  No pulmonary edema.   Electronically Signed   By: Lajean Manes M.D.   On: 08/28/2014 18:04    Microbiology: Recent Results (from the past 240 hour(s))  Culture, blood (routine x 2)     Status: None (Preliminary result)   Collection Time: 08/28/14  5:30 PM  Result Value Ref Range Status   Specimen Description BLOOD LEFT ANTECUBITAL  Final   Special Requests BOTTLES DRAWN AEROBIC AND ANAEROBIC 3CC  Final   Culture   Final           BLOOD CULTURE RECEIVED NO GROWTH TO DATE CULTURE WILL BE HELD FOR 5 DAYS BEFORE ISSUING A FINAL NEGATIVE REPORT Performed at [facility]    Report Status PENDING  Incomplete  Culture, blood (routine x 2)     Status: None (Preliminary result)   Collection Time: 08/28/14  5:31 PM  Result Value Ref Range Status   Specimen Description BLOOD LEFT FOREARM  Final   Special Requests BOTTLES DRAWN AEROBIC ONLY 4CC  Final   Culture   Final           BLOOD CULTURE RECEIVED  NO GROWTH TO DATE CULTURE WILL BE HELD FOR 5 DAYS BEFORE ISSUING A FINAL NEGATIVE REPORT Performed at [facility]    Report Status PENDING  Incomplete  Urine culture     Status: None   Collection Time: 08/28/14  6:23 PM  Result Value Ref Range Status   Specimen Description URINE, CLEAN CATCH  Final   Special Requests NONE  Final   Colony Count NO GROWTH Performed at [facility]   Final   Culture NO GROWTH Performed at [facility]   Final   Report Status 08/29/2014 FINAL  Final  CSF culture     Status: None   Collection Time: 08/28/14  8:19 PM  Result Value Ref Range Status   Specimen Description CSF  Final   Special Requests NONE  Final   Gram Stain   Final    NO WBC SEEN NO ORGANISMS SEEN Gram Stain Report Called to,Read Back By and Verified With: Gram Stain Report Called to,Read  Back By and Verified With: NURSE TRAVIA BENJAMIN 08/29/14 2:25AM [place] Performed at [facility]    Culture   Final    NO GROWTH 3 DAYS Performed at [facility]    Report Status 09/01/2014 FINAL  Final  Gram stain - STAT with CSF culture     Status: None   Collection Time: 08/28/14  8:19 PM  Result Value Ref Range Status   Specimen Description CSF  Final   Special Requests NONE  Final   Gram Stain   Final    NO WBC SEEN NO ORGANISMS SEEN CYTOSPIN PREPARATION NOTIFIED T.NIELSON,RN AT 2203 ON 08/28/14 BY W.SHEA    Report Status 08/28/2014 FINAL  Final  Culture, sputum-assessment     Status: None   Collection Time: 08/29/14 11:22 AM  Result Value Ref Range Status   Specimen Description SPUTUM  Final   Special Requests NONE  Final   Sputum evaluation   Final    MICROSCOPIC FINDINGS SUGGEST THAT THIS SPECIMEN IS NOT REPRESENTATIVE OF LOWER RESPIRATORY SECRETIONS. PLEASE RECOLLECT. INFORMED AVALR,E. RN @1237  ON 08/29/14 BY MCCOY,N.   Report Status 08/29/2014 FINAL  Final     Labs: Basic Metabolic Panel:  Recent Labs Lab 08/28/14 1729 08/29/14 0135 09/01/14 0628 09/02/14 0816 09/03/14 0620  NA 131* 134* 139 133* 134*  K 4.1 3.9 3.1* 4.5 4.3  CL 97 100 111 99 98  CO2 24 27 21 27 27   GLUCOSE 119* 165* 79 95 99  BUN 26* 22 16 19 20   CREATININE 1.18 1.27 0.78 1.07 1.05  CALCIUM 8.2* 8.1* 6.4* 8.2* 8.4   Liver Function Tests:  Recent Labs Lab 08/28/14 1729 08/29/14 0135  AST 28 24  ALT 29 26  ALKPHOS 46 39  BILITOT 0.9 0.7  PROT 6.3 5.5*  ALBUMIN 3.4* 2.7*   No results for input(s): LIPASE, AMYLASE in the last 168 hours. No results for input(s): AMMONIA in the last 168 hours. CBC:  Recent Labs Lab 08/28/14 1729 08/29/14 0135 09/01/14 0628  WBC 6.6 5.6 4.9  NEUTROABS 5.4 4.7  --   HGB 12.7* 11.7* 9.6*  HCT 37.8* 35.8* 29.0*  MCV 99.2 101.7* 99.3  PLT 116* 108* 102*   Cardiac Enzymes:  Recent Labs Lab 08/29/14 0135  TROPONINI <0.03    BNP: BNP (last 3 results) No results for input(s): PROBNP in the last 8760 hours. CBG: No results for input(s): GLUCAP in the last 168 hours.  Time coordinating discharge: Over 30  minutes

## 2014-09-04 NOTE — Care Management Note (Signed)
CARE MANAGEMENT NOTE 09/04/2014  Patient:  James [name], James [name]   Account Number:  [id number]  Date Initiated:  08/31/2014  Documentation initiated by:  Marney Doctor  Subjective/Objective Assessment:   79 yo admitted with Fever. Hx of metastatic bladder carcinoma     Action/Plan:   From home with spouse   Anticipated DC Date:  09/02/2014   Anticipated DC Plan:  [place]  CM consult      [place]   Choice offered to / List presented to:  C-1 Patient        [place] arranged  HH-1 RN  [place].   Status of service:  In process, will continue to follow Medicare Important Message given?  YES (If response is "NO", the following Medicare IM given date fields will be blank) Date Medicare IM given:  08/31/2014 Medicare IM given by:  Marney Doctor Date Additional Medicare IM given:  09/04/2014 Additional Medicare IM given by:  [facility] Allanah Mcfarland  Discharge Disposition:    Per UR Regulation:  Reviewed for med. necessity/level of care/duration of stay  If discussed at [place] of Stay Meetings, dates discussed:    Comments:  09/04/14 Marney Doctor RN,BSN,NCM Pt for DC home today.  MD wrote orders and scripts for wheelchair,3 in 1 and 4 wheeled RW.  Pts wife would like to go to the [facility] store and see what options they have for each.  [facility] rep alerted that pt will DC today.  Pt will borrow walker from neighbor to get into the house prior to his wife chosing a walker for him.  No other CM needs noted at this time.  08/31/14 Marney Doctor RN,BSN,NCM [phone] Spoke with pt and sister about DC planning.  PT is recommending HHPT.  Pt offered choice and chose [facility].  [facility] rep called to inform of referral.  PT is also recommending 4 wheeled rolling walker.  Pt states that the regular sized one is a little small for him when he sits on the seat but may be  able to tolerate it.  Per the DME rep pts insurance would probably not pay for a larger 4 wheeled walker because he does not weigh 300lbs which is the limit for the regular size.  The larger ones are available at the [facility] store. Pt given the option to get the larger one at the store with the possibility of having to pay some out of pocket or getting the smaller size.  Pt will think about it and let the CM know when he makes a decision.  CM will continue to follow.

## 2014-09-04 NOTE — Discharge Instructions (Addendum)
Please be careful ambulating up and down the stairs. Use caution.  Fever, Adult A fever is a higher than normal body temperature. In an adult, an oral temperature around 98.6 F (37 C) is considered normal. A temperature of 100.4 F (38 C) or higher is generally considered a fever. Mild or moderate fevers generally have no long-term effects and often do not require treatment. Extreme fever (greater than or equal to 106 F or 41.1 C) can cause seizures. The sweating that may occur with repeated or prolonged fever may cause dehydration. Elderly people can develop confusion during a fever. A measured temperature can vary with:  Age.  Time of day.  Method of measurement (mouth, underarm, rectal, or ear). The fever is confirmed by taking a temperature with a thermometer. Temperatures can be taken different ways. Some methods are accurate and some are not.  An oral temperature is used most commonly. Electronic thermometers are fast and accurate.  An ear temperature will only be accurate if the thermometer is positioned as recommended by the manufacturer.  A rectal temperature is accurate and done for those adults who have a condition where an oral temperature cannot be taken.  An underarm (axillary) temperature is not accurate and not recommended. Fever is a symptom, not a disease.  CAUSES   Infections commonly cause fever.  Some noninfectious causes for fever include:  Some arthritis conditions.  Some thyroid or adrenal gland conditions.  Some immune system conditions.  Some types of cancer.  A medicine reaction.  High doses of certain street drugs such as methamphetamine.  Dehydration.  Exposure to high outside or room temperatures.  Occasionally, the source of a fever cannot be determined. This is sometimes called a "fever of unknown origin" (FUO).  Some situations may lead to a temporary rise in body temperature that may go away on its own. Examples  are:  Childbirth.  Surgery.  Intense exercise. HOME CARE INSTRUCTIONS   Take appropriate medicines for fever. Follow dosing instructions carefully. If you use acetaminophen to reduce the fever, be careful to avoid taking other medicines that also contain acetaminophen. Do not take aspirin for a fever if you are younger than age 75. There is an association with Reye's syndrome. Reye's syndrome is a rare but potentially deadly disease.  If an infection is present and antibiotics have been prescribed, take them as directed. Finish them even if you start to feel better.  Rest as needed.  Maintain an adequate fluid intake. To prevent dehydration during an illness with prolonged or recurrent fever, you may need to drink extra fluid.Drink enough fluids to keep your urine clear or pale yellow.  Sponging or bathing with room temperature water may help reduce body temperature. Do not use ice water or alcohol sponge baths.  Dress comfortably, but do not over-bundle. SEEK MEDICAL CARE IF:   You are unable to keep fluids down.  You develop vomiting or diarrhea.  You are not feeling at least partly better after 3 days.  You develop new symptoms or problems. SEEK IMMEDIATE MEDICAL CARE IF:   You have shortness of breath or trouble breathing.  You develop excessive weakness.  You are dizzy or you faint.  You are extremely thirsty or you are making little or no urine.  You develop new pain that was not there before (such as in the head, neck, chest, back, or abdomen).  You have persistent vomiting and diarrhea for more than 1 to 2 days.  You develop a stiff  neck or your eyes become sensitive to light.  You develop a skin rash.  You have a fever or persistent symptoms for more than 2 to 3 days.  You have a fever and your symptoms suddenly get worse. MAKE SURE YOU:   Understand these instructions.  Will watch your condition.  Will get help right away if you are not doing well or  get worse. Document Released: 01/21/2001 Document Revised: 12/12/2013 Document Reviewed: 05/29/2011 [facility] Patient Information 2015 [place], [place]. This information is not intended to replace advice given to you by your health care provider. Make sure you discuss any questions you have with your health care provider.

## 2014-09-04 NOTE — Progress Notes (Signed)
Physical Therapy Treatment Patient Details Name: James [name] MRN: [id number] DOB: [birth year] Today's Date: 09/04/2014    History of Present Illness OLEY LAHAIE is a 79 y.o. male with history of CAD status post stenting, atrial flutter status post ablation, metastatic bladder carcinoma, OSA on C Pap, dyslipidemia presents to the ER because of weakness and confusion and fever.    PT Comments    Patient plans DC today, practiced stairs and instructed in  theraband exercises for UE's  Follow Up Recommendations  Home health PT;Supervision for mobility/OOB     Equipment Recommendations       Recommendations for Other Services       Precautions / Restrictions Precautions Precautions: Fall    Mobility  Bed Mobility Overal bed mobility: Modified Independent                Transfers   Equipment used: 4-wheeled walker Transfers: Sit to/from Stand Sit to Stand: Supervision         General transfer comment: cues for hand placement  Ambulation/Gait Ambulation/Gait assistance: Supervision Ambulation Distance (Feet): 80 Feet Assistive device: 4-wheeled walker Gait Pattern/deviations: WFL(Within Functional Limits)         Stairs Stairs: Yes Stairs assistance: Min assist Stair Management: No rails;Step to pattern;Forwards (HHA) Number of Stairs: 3 General stair comments: cues for safety without Rails  Wheelchair Mobility    Modified Rankin (Stroke Patients Only)       Balance                                    Cognition Arousal/Alertness: Awake/alert                          Exercises General Exercises - Lower Extremity Hip ABduction/ADduction: AROM;Both;5 reps;Standing Hip Flexion/Marching: AROM;Both;5 reps;Standing Other Exercises Other Exercises: blue theraband for shoulder horizontal abd, flexion , triceps, biceps+ Other Exercises: hip ext, hip abd. x 5 standing    General Comments        Pertinent Vitals/Pain  Pain Assessment: No/denies pain    Home Living                      Prior Function            PT Goals (current goals can now be found in the care plan section) Progress towards PT goals: Progressing toward goals    Frequency       PT Plan Current plan remains appropriate    Co-evaluation             End of Session   Activity Tolerance: Patient tolerated treatment well Patient left: with family/visitor present (in [place])     Time: 7517-0017 PT Time Calculation (min) (ACUTE ONLY): 15 min  Charges:  $Gait Training: 8-22 mins                    G Codes:      Claretha Cooper 09/04/2014, 1:23 PM Tresa Endo PT [phone]

## 2014-09-04 NOTE — Progress Notes (Signed)
Patient ID: James [name], male   DOB: 13-Jul-1936, 79 y.o.   MRN: [id number]         [facility] for Infectious Disease    Date of Admission:  08/28/2014   Total days of antibiotics 7         Principal Problem:   Fever Active Problems:   Acute encephalopathy   Metastasis from bladder cancer   Drug-induced skin rash   Sleep apnea   Coronary atherosclerosis of native coronary artery   Thrombocytopenia   Normocytic anemia   Obstructive sleep apnea   Cutaneous lupus erythematosus   Steroid-induced hyperglycemia   FUO (fever of unknown origin)   . aspirin  81 mg Oral Daily  . atorvastatin  40 mg Oral q1800  . calcium carbonate  1,000 mg of elemental calcium Oral BID WC  . carvedilol  6.25 mg Oral BID WC  . ceFEPime (MAXIPIME) IV  1 g Intravenous Q8H  . ferrous sulfate  325 mg Oral BID WC  . predniSONE  10 mg Oral Q breakfast  . sertraline  150 mg Oral Daily  . vancomycin  750 mg Intravenous Q12H  . venlafaxine XR  150 mg Oral Daily    Subjective: He still is a little weaker than normal but overall is feeling much better and is eager to go home.  Review of Systems: Pertinent items are noted in HPI.  Past Medical History  Diagnosis Date  . Atrial flutter 05/2009    STATUS POST CARDIOVERSION;  s/p RFCA in 2012 (Dr. Caryl Comes)  . Depression   . History of immunotherapy 2014    "had many times before and it worked; had a severe reaction Feb 08, 2013; they coded me, I had sepsis; almost died" (08/03/13)  . Bladder cancer     'started chemo 07/2013 " (August 03, 2013)  . OSA on CPAP   . Pneumonia   . CAD (coronary artery disease)     a. NSTEMI 07/2013:  LHC (08/03/13): mLAD 95% assoc w/ thrombus, dLAD 50%, pRCA 80%, mRCA 50%, normal LVF. => PCI:  Rebel (4 x 12 mm) BMS to the mLAD and Rebel (4 x 15 mm) BMS to the pRCA.  . Osteoarthritis   . Dyslipidemia     Lipitor started at time of NSTEMI 07/2013    History  Substance Use Topics  . Smoking status: Former Smoker -- 1.50  packs/day for 15 years    Types: Cigarettes  . Smokeless tobacco: Never Used     Comment: August 03, 2013 "quit smoking in 1984"  . Alcohol Use: Yes     Comment: 08-03-13 "hx of abuse; clean since 05/29/1981"    Family History  Problem Relation Age of Onset  . Heart failure Father   . Stroke Father   . Kidney failure Mother   . Coronary artery disease     Allergies  Allergen Reactions  . Morphine Nausea Only    OK if given slowly.  . Other Other (See Comments)    Patient states when he takes flomax and protonix together makes him break out in a rash  . Adhesive [Tape] Itching and Rash    Blisters, tears skin  . Bupropion Hives and Rash  . Flomax [Tamsulosin Hcl] Rash    When taken with protonix only patient can take this alone  . Pantoprazole Sodium Rash    When taken with flomax only patient can take this alone  . Tamsulosin Rash    When taken with protonix only patient can take  this alone    OBJECTIVE: Blood pressure 123/58, pulse 71, temperature 98.5 F (36.9 C), temperature source Oral, resp. rate 17, height 5\' 9"  (1.753 m), weight 255 lb 11.7 oz (116 kg), SpO2 95 %. General: He is sitting up in a chair. He is alert and smiling Skin: No change in chronic, healing rash Lungs: Clear Cor: Regular S1 and S2 with no murmurs Port-A-Cath site appears normal Abdomen: Soft and nontender  Lab Results Lab Results  Component Value Date   WBC 4.9 09/01/2014   HGB 9.6* 09/01/2014   HCT 29.0* 09/01/2014   MCV 99.3 09/01/2014   PLT 102* 09/01/2014    Lab Results  Component Value Date   CREATININE 1.05 09/03/2014   BUN 20 09/03/2014   NA 134* 09/03/2014   K 4.3 09/03/2014   CL 98 09/03/2014   CO2 27 09/03/2014    Lab Results  Component Value Date   ALT 26 08/29/2014   AST 24 08/29/2014   ALKPHOS 39 08/29/2014   BILITOT 0.7 08/29/2014     Microbiology: Recent Results (from the past 240 hour(s))  Culture, blood (routine x 2)     Status: None (Preliminary result)    Collection Time: 08/28/14  5:30 PM  Result Value Ref Range Status   Specimen Description BLOOD LEFT ANTECUBITAL  Final   Special Requests BOTTLES DRAWN AEROBIC AND ANAEROBIC 3CC  Final   Culture   Final           BLOOD CULTURE RECEIVED NO GROWTH TO DATE CULTURE WILL BE HELD FOR 5 DAYS BEFORE ISSUING A FINAL NEGATIVE REPORT Performed at [facility]    Report Status PENDING  Incomplete  Culture, blood (routine x 2)     Status: None (Preliminary result)   Collection Time: 08/28/14  5:31 PM  Result Value Ref Range Status   Specimen Description BLOOD LEFT FOREARM  Final   Special Requests BOTTLES DRAWN AEROBIC ONLY 4CC  Final   Culture   Final           BLOOD CULTURE RECEIVED NO GROWTH TO DATE CULTURE WILL BE HELD FOR 5 DAYS BEFORE ISSUING A FINAL NEGATIVE REPORT Performed at [facility]    Report Status PENDING  Incomplete  Urine culture     Status: None   Collection Time: 08/28/14  6:23 PM  Result Value Ref Range Status   Specimen Description URINE, CLEAN CATCH  Final   Special Requests NONE  Final   Colony Count NO GROWTH Performed at [facility]   Final   Culture NO GROWTH Performed at [facility]   Final   Report Status 08/29/2014 FINAL  Final  CSF culture     Status: None   Collection Time: 08/28/14  8:19 PM  Result Value Ref Range Status   Specimen Description CSF  Final   Special Requests NONE  Final   Gram Stain   Final    NO WBC SEEN NO ORGANISMS SEEN Gram Stain Report Called to,Read Back By and Verified With: Gram Stain Report Called to,Read Back By and Verified With: NURSE TRAVIA BENJAMIN 08/29/14 2:25AM [place] Performed at [facility]    Culture   Final    NO GROWTH 3 DAYS Performed at [facility]    Report Status 09/01/2014 FINAL  Final  Gram stain - STAT with CSF culture     Status: None   Collection Time: 08/28/14  8:19 PM  Result Value Ref Range Status  Specimen Description CSF  Final   Special  Requests NONE  Final   Gram Stain   Final    NO WBC SEEN NO ORGANISMS SEEN CYTOSPIN PREPARATION NOTIFIED T.NIELSON,RN AT 2203 ON 08/28/14 BY W.SHEA    Report Status 08/28/2014 FINAL  Final  Culture, sputum-assessment     Status: None   Collection Time: 08/29/14 11:22 AM  Result Value Ref Range Status   Specimen Description SPUTUM  Final   Special Requests NONE  Final   Sputum evaluation   Final    MICROSCOPIC FINDINGS SUGGEST THAT THIS SPECIMEN IS NOT REPRESENTATIVE OF LOWER RESPIRATORY SECRETIONS. PLEASE RECOLLECT. INFORMED AVALR,E. RN @1237  ON 08/29/14 BY MCCOY,N.   Report Status 08/29/2014 FINAL  Final    Assessment: His fever has resolved on empiric antibiotics. The source of his fever remains unclear and admission blood cultures remain negative. I favor stopping all antibiotics and observing now rather than continuing on oral levofloxacin for 7 more days. I doubt that there is any current active infection left to treat and a would prefer to shorten the course of antibiotics to limit potential side effects such as C. difficile colitis.  His fecal occult blood test was negative. I suspect that his black stools are due to the iron supplements he is taking.  Plan: 1. Discontinue vancomycin and cefepime and observe off of antibiotics 2. I will arrange follow-up in my clinic in 1 week  Michel Bickers, MD [facility] for [place] [phone] pager   [phone] cell 09/04/2014, 2:02 PM

## 2014-09-13 ENCOUNTER — Encounter: Payer: Self-pay | Admitting: Internal Medicine

## 2014-09-13 ENCOUNTER — Ambulatory Visit (INDEPENDENT_AMBULATORY_CARE_PROVIDER_SITE_OTHER): Payer: Medicare Other | Admitting: Internal Medicine

## 2014-09-13 VITALS — BP 122/73 | HR 76 | Temp 98.4°F | Wt 253.0 lb

## 2014-09-13 DIAGNOSIS — R509 Fever, unspecified: Secondary | ICD-10-CM

## 2014-09-13 NOTE — Progress Notes (Signed)
Patient ID: James [name], male   DOB: 14-May-1936, 79 y.o.   MRN: [id number]         [facility] for Infectious Disease  Patient Active Problem List   Diagnosis Date Noted  . Acute encephalopathy 08/28/2014    Priority: High  . Fever 07/17/2013    Priority: High  . Metastasis from bladder cancer 08/29/2014    Priority: Medium  . Drug-induced skin rash 08/29/2014    Priority: Medium  . FUO (fever of unknown origin)   . Thrombocytopenia 08/29/2014  . Normocytic anemia 08/29/2014  . Obstructive sleep apnea 08/29/2014  . Cutaneous lupus erythematosus 08/29/2014  . Steroid-induced hyperglycemia 08/29/2014  . Coronary atherosclerosis of native coronary artery 08/16/2013  . Dyslipidemia 08/16/2013  . Chest pain 08/02/2013  . Hyponatremia 07/17/2013  . Atelectasis 07/17/2013  . Dyspnea 02/28/2011  . Atrial flutter   . Bladder cancer   . Sleep apnea   . Depression     Patient's Medications  New Prescriptions   No medications on file  Previous Medications   ACETAMINOPHEN (TYLENOL) 500 MG TABLET    Take 500 mg by mouth every 6 (six) hours as needed for fever.   ANTISEPTIC ORAL RINSE (BIOTENE) LIQD    15 mLs by Mouth Rinse route as needed for dry mouth.   ASPIRIN 81 MG TABLET    Take 81 mg by mouth daily.   ATORVASTATIN (LIPITOR) 40 MG TABLET    TAKE 1 TABLET (40 MG TOTAL) BY MOUTH DAILY.   CALCIUM CARBONATE (OS-CAL - DOSED IN MG OF ELEMENTAL CALCIUM) 1250 (500 CA) MG TABLET    Take 1 tablet (500 mg of elemental calcium total) by mouth 2 (two) times daily with a meal.   CARVEDILOL (COREG) 6.25 MG TABLET    Take 1 tablet (6.25 mg total) by mouth 2 (two) times daily.   DEXAMETHASONE (DECADRON PO)    Take 1 tablet by mouth daily as needed (after chemo treatment).   FERROUS SULFATE 325 (65 FE) MG TABLET    Take 325 mg by mouth 2 (two) times daily with a meal.   GARLIC 979 MG CAPS    Take 300 mg by mouth daily.   LIDOCAINE-PRILOCAINE (EMLA) CREAM    Apply topically as needed for port    LOPERAMIDE (IMODIUM A-D) 2 MG TABLET    Take 2 mg by mouth 3 (three) times daily as needed for diarrhea or loose stools.   LORAZEPAM (ATIVAN) 1 MG TABLET    Take 1 tablet (1 mg total) by mouth every 12 (twelve) hours as needed for anxiety.   MULTIPLE VITAMIN PO    Take by mouth daily.   NETTLE, URTICA DIOICA, (NETTLE LEAF PO)    Take 300 mg by mouth daily.   NITROGLYCERIN (NITROSTAT) 0.4 MG SL TABLET    Place 0.4 mg under the tongue every 5 (five) minutes as needed for chest pain.    OMEGA-3 FATTY ACIDS (OMEGA 3 PO)    Take 1 capsule by mouth 2 (two) times daily. [phone] mg   OVER THE COUNTER MEDICATION    Broccamax 380mg  daily   OVER THE COUNTER MEDICATION    [place] 1100mg  daily   OVER THE COUNTER MEDICATION    Cell Builder Inistol daily   PREDNISONE (DELTASONE) 5 MG TABLET    Take 1 tablet (5 mg total) by mouth daily with breakfast.   PROBIOTIC PRODUCT (PROBIOTIC & ACIDOPHILUS EX ST PO)    Take by mouth daily.  SERTRALINE (ZOLOFT) 100 MG TABLET    Take 150 mg by mouth daily.    TRAMADOL (ULTRAM) 50 MG TABLET    Take 1 tablet (50 mg total) by mouth every 6 (six) hours as needed for moderate pain.   TRIAMCINOLONE CREAM (KENALOG) 0.1 %    Apply 1 application topically daily as needed (for itching).   VENLAFAXINE XR (EFFEXOR-XR) 150 MG 24 HR CAPSULE    Take 150 mg by mouth daily.   Modified Medications   No medications on file  Discontinued Medications   LEVOFLOXACIN (LEVAQUIN) 750 MG TABLET    Take 1 tablet (750 mg total) by mouth daily.    Subjective: Mr. Pozzi is in for his hospital follow-up visit. He has a long history of metastatic bladder cancer and has been undergoing chemotherapy under the direction of Dr. Aline Brochure at [facility]. He developed a severe rash related to one of his chemotherapy agents last fall and was started on high-dose steroids. His rash has been slowly improving but he had the sudden onset of fever, chills and confusion leading to admission  last month. His exams, radiographs and cultures failed to show a specific cause for his fevers. He was started on empiric vancomycin and cefepime and defervesced over several days. He completed 7 days of total therapy and was discharged home on no antibiotics. He has continued to improve slowly since that time. He has had no further fever or chills. His appetite has improved and he has starting working with a physical therapist. Review of Systems: Pertinent items are noted in HPI.  Past Medical History  Diagnosis Date  . Atrial flutter 05/2009    STATUS POST CARDIOVERSION;  s/p RFCA in 2012 (Dr. Caryl Comes)  . Depression   . History of immunotherapy 2014    "had many times before and it worked; had a severe reaction 02/16/13; they coded me, I had sepsis; almost died" (08-11-13)  . Bladder cancer     'started chemo 07/2013 " (Aug 11, 2013)  . OSA on CPAP   . Pneumonia   . CAD (coronary artery disease)     a. NSTEMI 07/2013:  LHC (08/03/13): mLAD 95% assoc w/ thrombus, dLAD 50%, pRCA 80%, mRCA 50%, normal LVF. => PCI:  Rebel (4 x 12 mm) BMS to the mLAD and Rebel (4 x 15 mm) BMS to the pRCA.  . Osteoarthritis   . Dyslipidemia     Lipitor started at time of NSTEMI 07/2013    History  Substance Use Topics  . Smoking status: Former Smoker -- 1.50 packs/day for 15 years    Types: Cigarettes  . Smokeless tobacco: Never Used     Comment: 2013/08/11 "quit smoking in 1984"  . Alcohol Use: Yes     Comment: 2013/08/11 "hx of abuse; clean since 05/29/1981"    Family History  Problem Relation Age of Onset  . Heart failure Father   . Stroke Father   . Kidney failure Mother   . Coronary artery disease      Allergies  Allergen Reactions  . Morphine Nausea Only    OK if given slowly.  . Other Other (See Comments)    Patient states when he takes flomax and protonix together makes him break out in a rash  . Adhesive [Tape] Itching and Rash    Blisters, tears skin  . Bupropion Hives and Rash  .  Flomax [Tamsulosin Hcl] Rash    When taken with protonix only patient can take this alone  .  Pantoprazole Sodium Rash    When taken with flomax only patient can take this alone  . Tamsulosin Rash    When taken with protonix only patient can take this alone    Objective: Temp: 98.4 F (36.9 C) (02/03 1449) Temp Source: Oral (02/03 1449) BP: 122/73 mmHg (02/03 1449) Pulse Rate: 76 (02/03 1449)  General: He is in good spirits Skin: Cheeks flushed as normal for him. His diffuse healing rash is unchanged Lungs: Clear Cor: Regular S1 and S2 no murmurs Abdomen: Obese, soft and nontender   Assessment: His recent fevers have resolved.  Plan: 1. Continue observation off of antibiotics 2. Follow-up here as needed   Michel Bickers, MD [facility] for [place] [phone] pager   [phone] cell 09/13/2014, 3:10 PM

## 2014-10-13 ENCOUNTER — Inpatient Hospital Stay (HOSPITAL_COMMUNITY)
Admission: EM | Admit: 2014-10-13 | Discharge: 2014-10-20 | DRG: 864 | Disposition: A | Discharge status: Home / Self Care | Payer: Medicare Other | Attending: Internal Medicine | Admitting: Internal Medicine

## 2014-10-13 ENCOUNTER — Emergency Department (HOSPITAL_COMMUNITY): Payer: Medicare Other

## 2014-10-13 ENCOUNTER — Encounter (HOSPITAL_COMMUNITY): Payer: Self-pay

## 2014-10-13 DIAGNOSIS — Z885 Allergy status to narcotic agent status: Secondary | ICD-10-CM | POA: Diagnosis not present

## 2014-10-13 DIAGNOSIS — I4891 Unspecified atrial fibrillation: Secondary | ICD-10-CM | POA: Diagnosis present

## 2014-10-13 DIAGNOSIS — C679 Malignant neoplasm of bladder, unspecified: Secondary | ICD-10-CM | POA: Diagnosis present

## 2014-10-13 DIAGNOSIS — N179 Acute kidney failure, unspecified: Secondary | ICD-10-CM | POA: Diagnosis present

## 2014-10-13 DIAGNOSIS — Z7982 Long term (current) use of aspirin: Secondary | ICD-10-CM | POA: Diagnosis not present

## 2014-10-13 DIAGNOSIS — Z7952 Long term (current) use of systemic steroids: Secondary | ICD-10-CM

## 2014-10-13 DIAGNOSIS — L539 Erythematous condition, unspecified: Secondary | ICD-10-CM | POA: Diagnosis present

## 2014-10-13 DIAGNOSIS — T451X5A Adverse effect of antineoplastic and immunosuppressive drugs, initial encounter: Secondary | ICD-10-CM | POA: Diagnosis present

## 2014-10-13 DIAGNOSIS — I252 Old myocardial infarction: Secondary | ICD-10-CM | POA: Diagnosis not present

## 2014-10-13 DIAGNOSIS — E871 Hypo-osmolality and hyponatremia: Secondary | ICD-10-CM | POA: Diagnosis present

## 2014-10-13 DIAGNOSIS — I2510 Atherosclerotic heart disease of native coronary artery without angina pectoris: Secondary | ICD-10-CM | POA: Diagnosis present

## 2014-10-13 DIAGNOSIS — C779 Secondary and unspecified malignant neoplasm of lymph node, unspecified: Secondary | ICD-10-CM | POA: Diagnosis present

## 2014-10-13 DIAGNOSIS — E785 Hyperlipidemia, unspecified: Secondary | ICD-10-CM | POA: Diagnosis present

## 2014-10-13 DIAGNOSIS — Z87891 Personal history of nicotine dependence: Secondary | ICD-10-CM

## 2014-10-13 DIAGNOSIS — Z7189 Other specified counseling: Secondary | ICD-10-CM

## 2014-10-13 DIAGNOSIS — J069 Acute upper respiratory infection, unspecified: Secondary | ICD-10-CM | POA: Diagnosis present

## 2014-10-13 DIAGNOSIS — E669 Obesity, unspecified: Secondary | ICD-10-CM | POA: Diagnosis present

## 2014-10-13 DIAGNOSIS — I10 Essential (primary) hypertension: Secondary | ICD-10-CM | POA: Diagnosis present

## 2014-10-13 DIAGNOSIS — D72819 Decreased white blood cell count, unspecified: Secondary | ICD-10-CM | POA: Diagnosis present

## 2014-10-13 DIAGNOSIS — E860 Dehydration: Secondary | ICD-10-CM | POA: Diagnosis present

## 2014-10-13 DIAGNOSIS — G4733 Obstructive sleep apnea (adult) (pediatric): Secondary | ICD-10-CM | POA: Diagnosis present

## 2014-10-13 DIAGNOSIS — Z515 Encounter for palliative care: Secondary | ICD-10-CM | POA: Diagnosis not present

## 2014-10-13 DIAGNOSIS — R509 Fever, unspecified: Principal | ICD-10-CM | POA: Diagnosis present

## 2014-10-13 DIAGNOSIS — F329 Major depressive disorder, single episode, unspecified: Secondary | ICD-10-CM | POA: Diagnosis present

## 2014-10-13 DIAGNOSIS — Z6837 Body mass index (BMI) 37.0-37.9, adult: Secondary | ICD-10-CM

## 2014-10-13 DIAGNOSIS — M79609 Pain in unspecified limb: Secondary | ICD-10-CM | POA: Diagnosis not present

## 2014-10-13 DIAGNOSIS — Z66 Do not resuscitate: Secondary | ICD-10-CM | POA: Diagnosis present

## 2014-10-13 DIAGNOSIS — M1990 Unspecified osteoarthritis, unspecified site: Secondary | ICD-10-CM | POA: Diagnosis present

## 2014-10-13 DIAGNOSIS — R52 Pain, unspecified: Secondary | ICD-10-CM | POA: Diagnosis present

## 2014-10-13 DIAGNOSIS — R6510 Systemic inflammatory response syndrome (SIRS) of non-infectious origin without acute organ dysfunction: Secondary | ICD-10-CM | POA: Diagnosis present

## 2014-10-13 DIAGNOSIS — R079 Chest pain, unspecified: Secondary | ICD-10-CM

## 2014-10-13 DIAGNOSIS — R0602 Shortness of breath: Secondary | ICD-10-CM

## 2014-10-13 DIAGNOSIS — C799 Secondary malignant neoplasm of unspecified site: Secondary | ICD-10-CM | POA: Diagnosis present

## 2014-10-13 DIAGNOSIS — Z888 Allergy status to other drugs, medicaments and biological substances status: Secondary | ICD-10-CM

## 2014-10-13 DIAGNOSIS — M79606 Pain in leg, unspecified: Secondary | ICD-10-CM | POA: Diagnosis present

## 2014-10-13 DIAGNOSIS — R109 Unspecified abdominal pain: Secondary | ICD-10-CM | POA: Diagnosis present

## 2014-10-13 DIAGNOSIS — R531 Weakness: Secondary | ICD-10-CM

## 2014-10-13 LAB — CBC WITH DIFFERENTIAL/PLATELET
Basophils Absolute: 0 10*3/uL (ref 0.0–0.1)
Basophils Relative: 0 % (ref 0–1)
EOS ABS: 0 10*3/uL (ref 0.0–0.7)
Eosinophils Relative: 0 % (ref 0–5)
HEMATOCRIT: 34.3 % — AB (ref 39.0–52.0)
Hemoglobin: 10.9 g/dL — ABNORMAL LOW (ref 13.0–17.0)
Lymphocytes Relative: 9 % — ABNORMAL LOW (ref 12–46)
Lymphs Abs: 0.6 10*3/uL — ABNORMAL LOW (ref 0.7–4.0)
MCH: 31 pg (ref 26.0–34.0)
MCHC: 31.8 g/dL (ref 30.0–36.0)
MCV: 97.4 fL (ref 78.0–100.0)
MONOS PCT: 3 % (ref 3–12)
Monocytes Absolute: 0.2 10*3/uL (ref 0.1–1.0)
NEUTROS ABS: 6.3 10*3/uL (ref 1.7–7.7)
Neutrophils Relative %: 88 % — ABNORMAL HIGH (ref 43–77)
Platelets: 150 10*3/uL (ref 150–400)
RBC: 3.52 MIL/uL — ABNORMAL LOW (ref 4.22–5.81)
RDW: 17 % — AB (ref 11.5–15.5)
WBC: 7.2 10*3/uL (ref 4.0–10.5)

## 2014-10-13 LAB — URINALYSIS, ROUTINE W REFLEX MICROSCOPIC
Bilirubin Urine: NEGATIVE
GLUCOSE, UA: NEGATIVE mg/dL
HGB URINE DIPSTICK: NEGATIVE
KETONES UR: NEGATIVE mg/dL
Leukocytes, UA: NEGATIVE
Nitrite: NEGATIVE
Protein, ur: NEGATIVE mg/dL
Specific Gravity, Urine: 1.018 (ref 1.005–1.030)
Urobilinogen, UA: 1 mg/dL (ref 0.0–1.0)
pH: 7 (ref 5.0–8.0)

## 2014-10-13 LAB — BASIC METABOLIC PANEL
Anion gap: 7 (ref 5–15)
BUN: 19 mg/dL (ref 6–23)
CALCIUM: 8.1 mg/dL — AB (ref 8.4–10.5)
CO2: 25 mmol/L (ref 19–32)
Chloride: 103 mmol/L (ref 96–112)
Creatinine, Ser: 0.97 mg/dL (ref 0.50–1.35)
GFR calc Af Amer: >90 mL/min (ref >90)
GFR, EST NON AFRICAN AMERICAN: 78 mL/min — AB (ref >90)
Glucose, Bld: 116 mg/dL — ABNORMAL HIGH (ref 70–99)
Potassium: 4 mmol/L (ref 3.5–5.1)
Sodium: 135 mmol/L (ref 135–145)

## 2014-10-13 LAB — I-STAT CG4 LACTIC ACID, ED: Lactic Acid, Venous: 1.37 mmol/L (ref 0.5–2.0)

## 2014-10-13 MED ORDER — VANCOMYCIN HCL IN DEXTROSE 1-5 GM/200ML-% IV SOLN: 1000.0000 mg | Freq: Once | INTRAVENOUS | Status: DC

## 2014-10-13 MED ORDER — OSELTAMIVIR PHOSPHATE 75 MG PO CAPS
75.0000 mg | ORAL_CAPSULE | Freq: Two times a day (BID) | ORAL | Status: DC
  Administered 2014-10-14 – 2014-10-15 (×4): 75 mg via ORAL
  Filled 2014-10-13 (×5): qty 1

## 2014-10-13 MED ORDER — SODIUM CHLORIDE 0.9 % IV SOLN
INTRAVENOUS | Status: AC
  Administered 2014-10-13: via INTRAVENOUS

## 2014-10-13 MED ORDER — ATORVASTATIN CALCIUM 40 MG PO TABS
40.0000 mg | ORAL_TABLET | Freq: Every day | ORAL | Status: DC
  Administered 2014-10-14 – 2014-10-19 (×6): 40 mg via ORAL
  Filled 2014-10-13 (×7): qty 1

## 2014-10-13 MED ORDER — SODIUM CHLORIDE 0.9 % IV BOLUS (SEPSIS)
1000.0000 mL | INTRAVENOUS | Status: AC
  Administered 2014-10-13: 1000 mL via INTRAVENOUS

## 2014-10-13 MED ORDER — PIPERACILLIN-TAZOBACTAM 3.375 G IVPB
3.3750 g | Freq: Three times a day (TID) | INTRAVENOUS | Status: DC
  Administered 2014-10-14 – 2014-10-16 (×7): 3.375 g via INTRAVENOUS
  Filled 2014-10-13 (×8): qty 50

## 2014-10-13 MED ORDER — ONDANSETRON HCL 4 MG PO TABS: 4.0000 mg | ORAL_TABLET | Freq: Four times a day (QID) | ORAL | Status: DC | PRN

## 2014-10-13 MED ORDER — VENLAFAXINE HCL ER 150 MG PO CP24
150.0000 mg | ORAL_CAPSULE | Freq: Every day | ORAL | Status: DC
  Administered 2014-10-14 – 2014-10-20 (×7): 150 mg via ORAL
  Filled 2014-10-13 (×7): qty 1

## 2014-10-13 MED ORDER — ONDANSETRON HCL 4 MG/2ML IJ SOLN: 4.0000 mg | Freq: Four times a day (QID) | INTRAMUSCULAR | Status: DC | PRN

## 2014-10-13 MED ORDER — CARVEDILOL 6.25 MG PO TABS
6.2500 mg | ORAL_TABLET | Freq: Two times a day (BID) | ORAL | Status: DC
  Administered 2014-10-14: 6.25 mg via ORAL
  Filled 2014-10-13 (×3): qty 1

## 2014-10-13 MED ORDER — SERTRALINE HCL 50 MG PO TABS
150.0000 mg | ORAL_TABLET | Freq: Every day | ORAL | Status: DC
  Administered 2014-10-14 – 2014-10-20 (×7): 150 mg via ORAL
  Filled 2014-10-13 (×7): qty 1

## 2014-10-13 MED ORDER — ACETAMINOPHEN 325 MG PO TABS
650.0000 mg | ORAL_TABLET | Freq: Four times a day (QID) | ORAL | Status: DC | PRN
  Filled 2014-10-13: qty 2

## 2014-10-13 MED ORDER — VANCOMYCIN HCL 10 G IV SOLR
2000.0000 mg | Freq: Once | INTRAVENOUS | Status: AC
  Administered 2014-10-13: 2000 mg via INTRAVENOUS
  Filled 2014-10-13: qty 2000

## 2014-10-13 MED ORDER — GUAIFENESIN ER 600 MG PO TB12
600.0000 mg | ORAL_TABLET | Freq: Two times a day (BID) | ORAL | Status: DC
  Administered 2014-10-14 – 2014-10-20 (×14): 600 mg via ORAL
  Filled 2014-10-13 (×19): qty 1

## 2014-10-13 MED ORDER — ACETAMINOPHEN 650 MG RE SUPP: 650.0000 mg | Freq: Four times a day (QID) | RECTAL | Status: DC | PRN

## 2014-10-13 MED ORDER — ENOXAPARIN SODIUM 40 MG/0.4ML ~~LOC~~ SOLN
40.0000 mg | Freq: Every day | SUBCUTANEOUS | Status: DC
  Administered 2014-10-14 (×2): 40 mg via SUBCUTANEOUS
  Filled 2014-10-13 (×3): qty 0.4

## 2014-10-13 MED ORDER — ACETAMINOPHEN 325 MG PO TABS
650.0000 mg | ORAL_TABLET | Freq: Four times a day (QID) | ORAL | Status: DC | PRN
  Administered 2014-10-14: 650 mg via ORAL
  Filled 2014-10-13: qty 2

## 2014-10-13 MED ORDER — PREDNISONE 10 MG PO TABS
10.0000 mg | ORAL_TABLET | Freq: Every day | ORAL | Status: DC
  Administered 2014-10-14 – 2014-10-20 (×7): 10 mg via ORAL
  Filled 2014-10-13 (×9): qty 1

## 2014-10-13 MED ORDER — ASPIRIN EC 81 MG PO TBEC
81.0000 mg | DELAYED_RELEASE_TABLET | Freq: Every day | ORAL | Status: DC
  Administered 2014-10-14 – 2014-10-20 (×7): 81 mg via ORAL
  Filled 2014-10-13 (×7): qty 1

## 2014-10-13 MED ORDER — SODIUM CHLORIDE 0.9 % IV BOLUS (SEPSIS): 500.0000 mL | INTRAVENOUS | Status: AC

## 2014-10-13 MED ORDER — PIPERACILLIN-TAZOBACTAM 3.375 G IVPB 30 MIN
3.3750 g | Freq: Once | INTRAVENOUS | Status: AC
  Administered 2014-10-13: 3.375 g via INTRAVENOUS
  Filled 2014-10-13: qty 50

## 2014-10-13 MED ORDER — TRAMADOL HCL 50 MG PO TABS
50.0000 mg | ORAL_TABLET | Freq: Once | ORAL | Status: AC
  Administered 2014-10-13: 50 mg via ORAL
  Filled 2014-10-13: qty 1

## 2014-10-13 MED ORDER — TRAMADOL HCL 50 MG PO TABS
50.0000 mg | ORAL_TABLET | Freq: Four times a day (QID) | ORAL | Status: DC | PRN
  Administered 2014-10-14: 50 mg via ORAL
  Filled 2014-10-13: qty 1

## 2014-10-13 MED ORDER — ACETAMINOPHEN 325 MG PO TABS
650.0000 mg | ORAL_TABLET | Freq: Once | ORAL | Status: AC
  Administered 2014-10-13: 650 mg via ORAL

## 2014-10-13 MED ORDER — HYDROCODONE-ACETAMINOPHEN 5-325 MG PO TABS
1.0000 | ORAL_TABLET | ORAL | Status: DC | PRN
  Administered 2014-10-13 – 2014-10-16 (×11): 2 via ORAL
  Administered 2014-10-16: 1 via ORAL
  Administered 2014-10-17: 2 via ORAL
  Administered 2014-10-17 (×3): 1 via ORAL
  Filled 2014-10-13: qty 2
  Filled 2014-10-13: qty 1
  Filled 2014-10-13 (×2): qty 2
  Filled 2014-10-13: qty 1
  Filled 2014-10-13 (×9): qty 2
  Filled 2014-10-13: qty 1
  Filled 2014-10-13: qty 2

## 2014-10-13 MED ORDER — VANCOMYCIN HCL IN DEXTROSE 750-5 MG/150ML-% IV SOLN
750.0000 mg | Freq: Two times a day (BID) | INTRAVENOUS | Status: DC
  Administered 2014-10-14 – 2014-10-15 (×3): 750 mg via INTRAVENOUS
  Filled 2014-10-13 (×4): qty 150

## 2014-10-13 MED ORDER — LORAZEPAM 1 MG PO TABS
1.0000 mg | ORAL_TABLET | Freq: Two times a day (BID) | ORAL | Status: DC | PRN
  Filled 2014-10-13: qty 1

## 2014-10-13 NOTE — ED Notes (Signed)
Admitting MD at bedside.

## 2014-10-13 NOTE — ED Notes (Signed)
Patient received a chemo treatment at [facility] 2 days ago. Patient has bladder cancer. Patient's temp spiked this afternoon to 101.7. Patient's wife was instructed by [facility] staff to come to the ED for hydration and full assessment.

## 2014-10-13 NOTE — ED Notes (Signed)
Pt presents with c/o fever that started last night. Pt's last chemo treatment was Wednesday of this week.

## 2014-10-13 NOTE — Progress Notes (Signed)
ANTIBIOTIC CONSULT NOTE - INITIAL  Pharmacy Consult for Vancomycin and Zosyn Indication: HCAP  Allergies  Allergen Reactions  . Morphine Nausea Only    OK if given slowly.  . Other Other (See Comments)    Patient states when he takes flomax and protonix together makes him break out in a rash  . Adhesive [Tape] Itching and Rash    Blisters, tears skin  . Bupropion Hives and Rash  . Flomax [Tamsulosin Hcl] Rash    When taken with protonix only patient can take this alone  . Pantoprazole Sodium Rash    When taken with flomax only patient can take this alone  . Tamsulosin Rash    When taken with protonix only patient can take this alone   Patient Measurements:   Total body weight: 115kg  Vital Signs: Temp: 100.6 F (38.1 C) (03/04 1828) Temp Source: Oral (03/04 1828) BP: 118/64 mmHg (03/04 1900) Pulse Rate: 85 (03/04 1900) Intake/Output from previous day:   Intake/Output from this shift:    Labs:  Recent Labs  10/13/14 1803  WBC 7.2  HGB 10.9*  PLT 150   CrCl cannot be calculated (Unknown ideal weight.). No results for input(s): VANCOTROUGH, VANCOPEAK, VANCORANDOM, GENTTROUGH, GENTPEAK, GENTRANDOM, TOBRATROUGH, TOBRAPEAK, TOBRARND, AMIKACINPEAK, AMIKACINTROU, AMIKACIN in the last 72 hours.   Microbiology: No results found for this or any previous visit (from the past 720 hour(s)).  Medical History: Past Medical History  Diagnosis Date  . Atrial flutter 05/2009    STATUS POST CARDIOVERSION;  s/p RFCA in 2012 (Dr. Caryl Comes)  . Depression   . History of immunotherapy 2014    "had many times before and it worked; had a severe reaction 02-10-2013; they coded me, I had sepsis; almost died" (05-Aug-2013)  . Bladder cancer     'started chemo 07/2013 " (08-05-13)  . OSA on CPAP   . Pneumonia   . CAD (coronary artery disease)     a. NSTEMI 07/2013:  LHC (08/03/13): mLAD 95% assoc w/ thrombus, dLAD 50%, pRCA 80%, mRCA 50%, normal LVF. => PCI:  Rebel (4 x 12 mm) BMS to the  mLAD and Rebel (4 x 15 mm) BMS to the pRCA.  . Osteoarthritis   . Dyslipidemia     Lipitor started at time of NSTEMI 07/2013   Medications:  Scheduled:   Anti-infectives    Start     Dose/Rate Route Frequency Ordered Stop   10/13/14 1915  piperacillin-tazobactam (ZOSYN) IVPB 3.375 g     3.375 g 100 mL/hr over 30 Minutes Intravenous  Once 10/13/14 1906     10/13/14 1915  vancomycin (VANCOCIN) IVPB 1000 mg/200 mL premix  Status:  Discontinued     1,000 mg 200 mL/hr over 60 Minutes Intravenous  Once 10/13/14 1906 10/13/14 1914   10/13/14 1915  vancomycin (VANCOCIN) 2,000 mg in sodium chloride 0.9 % 500 mL IVPB     2,000 mg 250 mL/hr over 120 Minutes Intravenous  Once 10/13/14 1915       Assessment: 55 yoM with bladder, receiving chemo at [facility], Taxol on 3/2. To ED with fever, myalgias, joint pain, productive cough. Begin Vancomycin & Zosyn, r/o HCAP.  Current SCr 0.97, was 1.2 on 3/2 at [facility], WBC wnl 7.2K  Goal of Therapy:  Vancomycin trough level 15-20 mcg/ml  Plan:   Vancomycin 2gm x1, then 750mg  q12  Zosyn 3.375gm q8h- 4 hr infusion  Minda Ditto PharmD Pager [phone] 10/13/2014, 8:30 PM

## 2014-10-13 NOTE — H&P (Signed)
Oncology at [facility], [place], [phone] PCP: [place]   Chief Complaint:  fever  HPI: James [name] is a 79 y.o. male   has a past medical history of Atrial flutter (05/2009) sp cardioversion; Depression; History of immunotherapy (2014); Bladder cancer; OSA on CPAP; Pneumonia; CAD (coronary artery disease); Osteoarthritis; and Dyslipidemia.   Presented with  James [name] have been dealing with metastatic bladder cancer with mets to lymph nodes and left adrenal. He has been followed for this at [facility] recent medical course has been complicated by development of a rash likely secondary to one of the study drugs resulting in diffuse skin reaction requiring IV steroidshe is still currently on Taper. He was hospitalized at [facility] form January 18-25 of 2016 and was diagnosed with fever on unknown origin. had an extensive workup including blood cultures and CSF cultures. James [name] was treated initially with IV antibiotics but since his cultures did not grow anything he was discharged on no antibiotics to home. June his hospitalization he was seen by infectious disease  He was discharged to home with PT/OT. Last week he traveled to [facility] for vocation. Since the trip he developed mild URI type of symptoms.   On Wednesday 10/11/14 he was given his first dose of Taxol at [facility]. At that time he was having some sore throat and cough but the decision was to go ahead with chemo as his exam was unremarkable. Yesterday he started to have low grade fever, and muscle aches. Today the fever went up to 101.5. he started to have productive cough with greenish to whitish sputum. Reports some nasal congestion. Cough is worse in the morning.  James [name] presented to ER - CXR was unremarkable although James [name] started to produce large amount of sputum. James [name] reports having a flu vaccination this year.  Of note Y states that they had a discussion about possibly initiating hospice. But at this point  James [name] still actively receiving chemotherapy in this decision has not been finalized.   Hospitalist was called for admission for SIRS possible bronchitis and immune compromised James [name]   Review of Systems:    Pertinent positives include: Fevers, chills, fatigue, productive cough,   Constitutional:  No weight loss, night sweats,  weight loss  HEENT:  No headaches, Difficulty swallowing,Tooth/dental problems,Sore throat,  No sneezing, itching, ear ache, nasal congestion, post nasal drip,  Cardio-vascular:  No chest pain, Orthopnea, PND, anasarca, dizziness, palpitations.no Bilateral lower extremity swelling  GI:  No heartburn, indigestion, abdominal pain, nausea, vomiting, diarrhea, change in bowel habits, loss of appetite, melena, blood in stool, hematemesis Resp:  no shortness of breath at rest. No dyspnea on exertion, No excess mucus, no No non-productive cough, No coughing up of blood.No change in color of mucus.No wheezing. Skin:  no rash or lesions. No jaundice GU:  no dysuria, change in color of urine, no urgency or frequency. No straining to urinate.  No flank pain.  Musculoskeletal:  No joint pain or no joint swelling. No decreased range of motion. No back pain.  Psych:  No change in mood or affect. No depression or anxiety. No memory loss.  Neuro: no localizing neurological complaints, no tingling, no weakness, no double vision, no gait abnormality, no slurred speech, no confusion  Otherwise ROS are negative except for above, 10 systems were reviewed  Past Medical History: Past Medical History  Diagnosis Date  . Atrial flutter 05/2009    STATUS POST CARDIOVERSION;  s/p RFCA in 2012 (Dr. Caryl Comes)  .  Depression   . History of immunotherapy 2014    "had many times before and it worked; had a severe reaction 02-08-13; they coded me, I had sepsis; almost died" (2013-08-03)  . Bladder cancer     'started chemo 07/2013 " (Aug 03, 2013)  . OSA on CPAP   . Pneumonia   . CAD  (coronary artery disease)     a. NSTEMI 07/2013:  LHC (08/03/13): mLAD 95% assoc w/ thrombus, dLAD 50%, pRCA 80%, mRCA 50%, normal LVF. => PCI:  Rebel (4 x 12 mm) BMS to the mLAD and Rebel (4 x 15 mm) BMS to the pRCA.  . Osteoarthritis   . Dyslipidemia     Lipitor started at time of NSTEMI 07/2013   Past Surgical History  Procedure Laterality Date  . Carpal tunnel release Bilateral ~ 2012  . Atrial flutter ablation  2012  . Cardioversion  05/2009  . Transurethral resection of bladder tumor      "several" (August 03, 2013)  . Cystoscopy  4-12/2012    "biopsies; removal of tissue" (03-Aug-2013)  . Renal mass excision  03/2013    "I've had 2 of these; percutaneous" (08/03/2013)  . Lymph node biopsy  06/2013    "in the area of the kidney" (08-03-13)  . Tonsillectomy and adenoidectomy  1940's  . Left heart catheterization with coronary angiogram N/A 08/03/2013    Procedure: LEFT HEART CATHETERIZATION WITH CORONARY ANGIOGRAM;  Surgeon: Ramond Dial, MD;  Location: [facility] CATH LAB;  Service: Cardiovascular;  Laterality: N/A;  . Percutaneous coronary stent intervention (pci-s)  08/03/2013    Procedure: PERCUTANEOUS CORONARY STENT INTERVENTION (PCI-S);  Surgeon: Ramond Dial, MD;  Location: [facility] CATH LAB;  Service: Cardiovascular;;     Medications: Prior to Admission medications   Medication Sig Start Date End Date Taking? Authorizing Provider  acetaminophen (TYLENOL) 500 MG tablet Take 500 mg by mouth every 6 (six) hours as needed for fever.   Yes Historical Provider, MD  antiseptic oral rinse (BIOTENE) LIQD 15 mLs by Mouth Rinse route as needed for dry mouth.   Yes Historical Provider, MD  aspirin 81 MG tablet Take 81 mg by mouth daily.   Yes Historical Provider, MD  atorvastatin (LIPITOR) 40 MG tablet TAKE 1 TABLET (40 MG TOTAL) BY MOUTH DAILY. 07/19/14  Yes Thayer Headings, MD  calcium carbonate (OS-CAL - DOSED IN MG OF ELEMENTAL CALCIUM) 1250 (500 CA) MG tablet Take 1 tablet (500 mg of  elemental calcium total) by mouth 2 (two) times daily with a meal. 09/04/14  Yes Robbie Lis, MD  carvedilol (COREG) 6.25 MG tablet Take 1 tablet (6.25 mg total) by mouth 2 (two) times daily. 10/26/13  Yes Thayer Headings, MD  Dexamethasone (DECADRON PO) Take 1 tablet by mouth daily as needed (after chemo treatment).   Yes Historical Provider, MD  DiphenhydrAMINE HCl (BENADRYL IJ) Inject 1 each as directed once.   Yes Historical Provider, MD  ferrous sulfate 325 (65 FE) MG tablet Take 325 mg by mouth daily.    Yes Historical Provider, MD  Garlic 097 MG CAPS Take 300 mg by mouth daily.   Yes Historical Provider, MD  lidocaine-prilocaine (EMLA) cream Apply topically as needed for port   Yes Historical Provider, MD  LORazepam (ATIVAN) 1 MG tablet Take 1 tablet (1 mg total) by mouth every 12 (twelve) hours as needed for anxiety. 09/04/14  Yes Robbie Lis, MD  MULTIPLE VITAMIN PO Take by mouth daily.   Yes Historical  Provider, MD  Nettle, Urtica Dioica, (NETTLE LEAF PO) Take 300 mg by mouth daily.   Yes Historical Provider, MD  nitroGLYCERIN (NITROSTAT) 0.4 MG SL tablet Place 0.4 mg under the tongue every 5 (five) minutes as needed for chest pain.    Yes Historical Provider, MD  nystatin cream (MYCOSTATIN) Apply 1 application topically daily as needed for dry skin.   Yes Historical Provider, MD  Omega-3 Fatty Acids (OMEGA 3 PO) Take 1 capsule by mouth 2 (two) times daily. [phone] mg   Yes Historical Provider, MD  OVER THE COUNTER MEDICATION Broccamax 380mg  daily   Yes Historical Provider, MD  OVER THE COUNTER MEDICATION [place] 1100mg  daily   Yes Historical Provider, MD  OVER THE COUNTER MEDICATION Cell Builder Inistol daily   Yes Historical Provider, MD  PACLitaxel (TAXOL IV) Inject into the vein.   Yes Historical Provider, MD  predniSONE (DELTASONE) 5 MG tablet Take 1 tablet (5 mg total) by mouth daily with breakfast. James [name] taking differently: Take 10 mg by mouth daily with breakfast.  09/04/14   Yes Robbie Lis, MD  Probiotic Product (PROBIOTIC & ACIDOPHILUS EX ST PO) Take by mouth daily.   Yes Historical Provider, MD  prochlorperazine (COMPAZINE) 10 MG tablet Take 10 mg by mouth every 6 (six) hours as needed for nausea or vomiting.   Yes Historical Provider, MD  sertraline (ZOLOFT) 100 MG tablet Take 150 mg by mouth daily.    Yes Historical Provider, MD  traMADol (ULTRAM) 50 MG tablet Take 1 tablet (50 mg total) by mouth every 6 (six) hours as needed for moderate pain. 09/04/14  Yes Robbie Lis, MD  triamcinolone cream (KENALOG) 0.1 % Apply 1 application topically daily as needed (for itching).   Yes Historical Provider, MD  venlafaxine XR (EFFEXOR-XR) 150 MG 24 hr capsule Take 150 mg by mouth daily.    Yes Historical Provider, MD  loperamide (IMODIUM A-D) 2 MG tablet Take 2 mg by mouth 3 (three) times daily as needed for diarrhea or loose stools.    Historical Provider, MD    Allergies:   Allergies  Allergen Reactions  . Morphine Nausea Only    OK if given slowly.  . Other Other (See Comments)    James [name] states when he takes flomax and protonix together makes him break out in a rash  . Adhesive [Tape] Itching and Rash    Blisters, tears skin  . Bupropion Hives and Rash  . Flomax [Tamsulosin Hcl] Rash    When taken with protonix only James [name] can take this alone  . Pantoprazole Sodium Rash    When taken with flomax only James [name] can take this alone  . Tamsulosin Rash    When taken with protonix only James [name] can take this alone    Social History:  Ambulatory independently  Or cane Lives at home  With family     reports that he has quit smoking. His smoking use included Cigarettes. He has a 22.5 pack-year smoking history. He has never used smokeless tobacco. He reports that he does not drink alcohol or use illicit drugs.    Family History: family history includes Coronary artery disease in an other family member; Heart failure in his father; Kidney failure in his  mother; Stroke in his father.    Physical Exam: James [name] Vitals for the past 24 hrs:  BP Temp Temp src Pulse Resp SpO2  10/13/14 2100 123/61 mmHg - - 81 13 93 %  10/13/14 2046 - 100.1 F (  37.8 C) Oral - - -  10/13/14 2030 129/63 mmHg - - 81 - 95 %  10/13/14 2015 119/59 mmHg - - 82 18 92 %  10/13/14 2000 115/61 mmHg - - - 13 -  10/13/14 1945 118/57 mmHg - - - 19 -  10/13/14 1930 114/60 mmHg - - 84 14 96 %  10/13/14 1915 110/62 mmHg - - 81 17 98 %  10/13/14 1900 118/64 mmHg - - 85 21 95 %  10/13/14 1845 117/68 mmHg - - 83 21 95 %  10/13/14 1830 122/58 mmHg - - 82 11 96 %  10/13/14 1828 - 100.6 F (38.1 C) Oral - - -  10/13/14 1800 118/60 mmHg - - 81 17 95 %  10/13/14 1745 115/60 mmHg - - 80 15 97 %  10/13/14 1701 115/64 mmHg 100 F (37.8 C) Oral 85 20 100 %    1. General:  in No Acute distress 2. Psychological: Alert and  Oriented 3. Head/ENT:    Dry Mucous Membranes                          Head Non traumatic, neck supple                          Normal   Dentition 4. SKIN:  decreased Skin turgor,  Skin clean Dry and intact no rash 5. Heart: Regular rate and rhythm no Murmur, Rub or gallop 6. Lungs: Clear to auscultation bilaterally, no wheezes or crackles   7. Abdomen: Soft, non-tender, slightly distended 8. Lower extremities: no clubbing, cyanosis, or edema 9. Neurologically Grossly intact, moving all 4 extremities equally 10. MSK: Normal range of motion  body mass index is unknown because there is no weight on file.   Labs on Admission:   Results for orders placed or performed during the hospital encounter of 10/13/14 (from the past 24 hour(s))  CBC with Differential/Platelet     Status: Abnormal   Collection Time: 10/13/14  6:03 PM  Result Value Ref Range   WBC 7.2 4.0 - 10.5 K/uL   RBC 3.52 (L) 4.22 - 5.81 MIL/uL   Hemoglobin 10.9 (L) 13.0 - 17.0 g/dL   HCT 34.3 (L) 39.0 - 52.0 %   MCV 97.4 78.0 - 100.0 fL   MCH 31.0 26.0 - 34.0 pg   MCHC 31.8 30.0 - 36.0 g/dL    RDW 17.0 (H) 11.5 - 15.5 %   Platelets 150 150 - 400 K/uL   Neutrophils Relative % 88 (H) 43 - 77 %   Neutro Abs 6.3 1.7 - 7.7 K/uL   Lymphocytes Relative 9 (L) 12 - 46 %   Lymphs Abs 0.6 (L) 0.7 - 4.0 K/uL   Monocytes Relative 3 3 - 12 %   Monocytes Absolute 0.2 0.1 - 1.0 K/uL   Eosinophils Relative 0 0 - 5 %   Eosinophils Absolute 0.0 0.0 - 0.7 K/uL   Basophils Relative 0 0 - 1 %   Basophils Absolute 0.0 0.0 - 0.1 K/uL  Basic metabolic panel     Status: Abnormal   Collection Time: 10/13/14  6:03 PM  Result Value Ref Range   Sodium 135 135 - 145 mmol/L   Potassium 4.0 3.5 - 5.1 mmol/L   Chloride 103 96 - 112 mmol/L   CO2 25 19 - 32 mmol/L   Glucose, Bld 116 (H) 70 - 99 mg/dL   BUN 19  6 - 23 mg/dL   Creatinine, Ser 0.97 0.50 - 1.35 mg/dL   Calcium 8.1 (L) 8.4 - 10.5 mg/dL   GFR calc non Af Amer 78 (L) >90 mL/min   GFR calc Af Amer >90 >90 mL/min   Anion gap 7 5 - 15  I-Stat CG4 Lactic Acid, ED     Status: None   Collection Time: 10/13/14  6:10 PM  Result Value Ref Range   Lactic Acid, Venous 1.37 0.5 - 2.0 mmol/L    UA no evidence of UTI  No results found for: HGBA1C  CrCl cannot be calculated (Unknown ideal weight.).  BNP (last 3 results) No results for input(s): PROBNP in the last 8760 hours.  Other results:  I have pearsonaly reviewed this: ECG not obtained    There were no vitals filed for this visit.  Cultures:    Component Value Date/Time   SDES SPUTUM 08/29/2014 1122   SPECREQUEST NONE 08/29/2014 1122   CULT  08/28/2014 2019    NO GROWTH 3 DAYS Performed at [place] 08/29/2014 FINAL 08/29/2014 1122     Radiological Exams on Admission: Dg Chest Port 1 View  10/13/2014   CLINICAL DATA:  Productive cough. Fever. Undergoing chemotherapy for bladder carcinoma.  EXAM: PORTABLE CHEST - 1 VIEW  COMPARISON:  08/28/2014  FINDINGS: The heart size and mediastinal contours are within normal limits. Both lungs are clear. Previously seen  infiltrate in left lung base has resolved. No evidence of pleural effusion. Power port remains in appropriate position.  IMPRESSION: No active disease.   Electronically Signed   By: Earle Gell M.D.   On: 10/13/2014 19:42    Chart has been reviewed  Assessment/Plan  79 year old gentleman with history of metastatic bladder cancer currently undergoing chemotherapy also on chronic steroids for chemotherapy induced skin reaction presents with URI type of symptoms followed by fever with evidence of SIRS given fever RR >20 and immuno compromised status.  Present on Admission:  . SIRS (systemic inflammatory response syndrome) - for now treat with empiric antibiotics vancomycin and Zosyn, also given myalgias associated fever possibility of influenza will cover with Tamiflu until testing can be done. Will write for droplet precautions. Await results of blood cultures and sputum culture  . Obstructive sleep apnea- CPAPas per home regimen . Metastasis from bladder cancer - will need to discuss with [facility] oncology in the morning  . Coronary atherosclerosis of native coronary artery - currently stable   history of atrial fibrillation - currently in sinus rhythm continue current medial he has been in sinus rhythm since cardioversion 2010 not on anticoagulation    Prophylaxis:   Lovenox,   CODE STATUS:  FULL CODE    Other plan as per orders.  I have spent a total of 55 min on this admission  Rayann Jolley 10/13/2014, 9:16 PM  [facility]  Pager [phone]   after 2 AM please page floor coverage PA If 7AM-7PM, please contact the day team taking care of the James [name]  [email or web address]  Password [email or web address]

## 2014-10-13 NOTE — ED Provider Notes (Signed)
CSN: [id number]     Arrival date & time 10/13/14  1652 History   First MD Initiated Contact with Patient 10/13/14 1756     Chief Complaint  Patient presents with  . Fever     (Consider location/radiation/quality/duration/timing/severity/associated sxs/prior Treatment) Patient is a 79 y.o. male presenting with fever. The history is provided by the patient and the spouse. No language interpreter was used.  Fever Max temp prior to arrival:  101.7 Temp source:  Oral Onset quality:  Gradual Duration:  2 days Timing:  Intermittent Progression:  Worsening Chronicity:  New Relieved by:  Acetaminophen Worsened by:  Nothing tried Associated symptoms: chills, congestion, cough and myalgias   Associated symptoms: no chest pain, no confusion, no diarrhea, no dysuria, no headaches, no nausea, no rash, no rhinorrhea and no vomiting   Risk factors: hx of cancer, immunosuppression and recent travel (to [employer])   Risk factors: no occupational exposure, no recent surgery and no sick contacts     Past Medical History  Diagnosis Date  . Atrial flutter 05/2009    STATUS POST CARDIOVERSION;  s/p RFCA in 2012 (Dr. Caryl Comes)  . Depression   . History of immunotherapy 2014    "had many times before and it worked; had a severe reaction 02/25/13; they coded me, I had sepsis; almost died" (20-Aug-2013)  . Bladder cancer     'started chemo 07/2013 " (2013/08/20)  . OSA on CPAP   . Pneumonia   . CAD (coronary artery disease)     a. NSTEMI 07/2013:  LHC (08/03/13): mLAD 95% assoc w/ thrombus, dLAD 50%, pRCA 80%, mRCA 50%, normal LVF. => PCI:  Rebel (4 x 12 mm) BMS to the mLAD and Rebel (4 x 15 mm) BMS to the pRCA.  . Osteoarthritis   . Dyslipidemia     Lipitor started at time of NSTEMI 07/2013   Past Surgical History  Procedure Laterality Date  . Carpal tunnel release Bilateral ~ 2012  . Atrial flutter ablation  2012  . Cardioversion  05/2009  . Transurethral resection of bladder tumor     "several" (08-20-13)  . Cystoscopy  4-12/2012    "biopsies; removal of tissue" (08/20/13)  . Renal mass excision  03/2013    "I've had 2 of these; percutaneous" (08-20-13)  . Lymph node biopsy  06/2013    "in the area of the kidney" (2013/08/20)  . Tonsillectomy and adenoidectomy  1940's  . Left heart catheterization with coronary angiogram N/A 08/03/2013    Procedure: LEFT HEART CATHETERIZATION WITH CORONARY ANGIOGRAM;  Surgeon: Ramond Dial, MD;  Location: [facility] CATH LAB;  Service: Cardiovascular;  Laterality: N/A;  . Percutaneous coronary stent intervention (pci-s)  08/03/2013    Procedure: PERCUTANEOUS CORONARY STENT INTERVENTION (PCI-S);  Surgeon: Ramond Dial, MD;  Location: [facility] CATH LAB;  Service: Cardiovascular;;   Family History  Problem Relation Age of Onset  . Heart failure Father   . Stroke Father   . Kidney failure Mother   . Coronary artery disease     History  Substance Use Topics  . Smoking status: Former Smoker -- 1.50 packs/day for 15 years    Types: Cigarettes  . Smokeless tobacco: Never Used     Comment: 08/20/2013 "quit smoking in 1984"  . Alcohol Use: No     Comment: 10/13/2014 "hx of abuse; clean since 05/29/1981"    Review of Systems  Constitutional: Positive for fever and chills. Negative for activity change, appetite change and fatigue.  HENT: Positive for congestion. Negative for facial swelling, rhinorrhea and trouble swallowing.   Eyes: Negative for photophobia and pain.  Respiratory: Positive for cough. Negative for chest tightness and shortness of breath.   Cardiovascular: Negative for chest pain and leg swelling.  Gastrointestinal: Negative for nausea, vomiting, abdominal pain, diarrhea and constipation.  Endocrine: Negative for polydipsia and polyuria.  Genitourinary: Negative for dysuria, urgency, decreased urine volume and difficulty urinating.  Musculoskeletal: Positive for myalgias. Negative for back pain and gait problem.  Skin:  Negative for color change, rash and wound.  Allergic/Immunologic: Negative for immunocompromised state.  Neurological: Negative for dizziness, facial asymmetry, speech difficulty, weakness, numbness and headaches.  Psychiatric/Behavioral: Negative for confusion, decreased concentration and agitation.      Allergies  Morphine; Other; Adhesive; Bupropion; Flomax; Pantoprazole sodium; and Tamsulosin  Home Medications   Prior to Admission medications   Medication Sig Start Date End Date Taking? Authorizing Provider  acetaminophen (TYLENOL) 500 MG tablet Take 500 mg by mouth every 6 (six) hours as needed for fever.   Yes Historical Provider, MD  antiseptic oral rinse (BIOTENE) LIQD 15 mLs by Mouth Rinse route as needed for dry mouth.   Yes Historical Provider, MD  aspirin 81 MG tablet Take 81 mg by mouth daily.   Yes Historical Provider, MD  atorvastatin (LIPITOR) 40 MG tablet TAKE 1 TABLET (40 MG TOTAL) BY MOUTH DAILY. 07/19/14  Yes Thayer Headings, MD  calcium carbonate (OS-CAL - DOSED IN MG OF ELEMENTAL CALCIUM) 1250 (500 CA) MG tablet Take 1 tablet (500 mg of elemental calcium total) by mouth 2 (two) times daily with a meal. 09/04/14  Yes Robbie Lis, MD  carvedilol (COREG) 6.25 MG tablet Take 1 tablet (6.25 mg total) by mouth 2 (two) times daily. 10/26/13  Yes Thayer Headings, MD  Dexamethasone (DECADRON PO) Take 1 tablet by mouth daily as needed (after chemo treatment).   Yes Historical Provider, MD  DiphenhydrAMINE HCl (BENADRYL IJ) Inject 1 each as directed once.   Yes Historical Provider, MD  ferrous sulfate 325 (65 FE) MG tablet Take 325 mg by mouth daily.    Yes Historical Provider, MD  Garlic 202 MG CAPS Take 300 mg by mouth daily.   Yes Historical Provider, MD  lidocaine-prilocaine (EMLA) cream Apply topically as needed for port   Yes Historical Provider, MD  LORazepam (ATIVAN) 1 MG tablet Take 1 tablet (1 mg total) by mouth every 12 (twelve) hours as needed for anxiety. 09/04/14  Yes  Robbie Lis, MD  MULTIPLE VITAMIN PO Take by mouth daily.   Yes Historical Provider, MD  Nettle, Urtica Dioica, (NETTLE LEAF PO) Take 300 mg by mouth daily.   Yes Historical Provider, MD  nitroGLYCERIN (NITROSTAT) 0.4 MG SL tablet Place 0.4 mg under the tongue every 5 (five) minutes as needed for chest pain.    Yes Historical Provider, MD  nystatin cream (MYCOSTATIN) Apply 1 application topically daily as needed for dry skin.   Yes Historical Provider, MD  Omega-3 Fatty Acids (OMEGA 3 PO) Take 1 capsule by mouth 2 (two) times daily. [phone] mg   Yes Historical Provider, MD  OVER THE COUNTER MEDICATION Broccamax 380mg  daily   Yes Historical Provider, MD  OVER THE COUNTER MEDICATION [place] 1100mg  daily   Yes Historical Provider, MD  OVER THE COUNTER MEDICATION Cell Builder Inistol daily   Yes Historical Provider, MD  PACLitaxel (TAXOL IV) Inject into the vein.   Yes Historical Provider, MD  predniSONE (DELTASONE)  5 MG tablet Take 1 tablet (5 mg total) by mouth daily with breakfast. Patient taking differently: Take 10 mg by mouth daily with breakfast.  09/04/14  Yes Robbie Lis, MD  Probiotic Product (PROBIOTIC & ACIDOPHILUS EX ST PO) Take by mouth daily.   Yes Historical Provider, MD  prochlorperazine (COMPAZINE) 10 MG tablet Take 10 mg by mouth every 6 (six) hours as needed for nausea or vomiting.   Yes Historical Provider, MD  sertraline (ZOLOFT) 100 MG tablet Take 150 mg by mouth daily.    Yes Historical Provider, MD  traMADol (ULTRAM) 50 MG tablet Take 1 tablet (50 mg total) by mouth every 6 (six) hours as needed for moderate pain. 09/04/14  Yes Robbie Lis, MD  triamcinolone cream (KENALOG) 0.1 % Apply 1 application topically daily as needed (for itching).   Yes Historical Provider, MD  venlafaxine XR (EFFEXOR-XR) 150 MG 24 hr capsule Take 150 mg by mouth daily.    Yes Historical Provider, MD  loperamide (IMODIUM A-D) 2 MG tablet Take 2 mg by mouth 3 (three) times daily as needed for  diarrhea or loose stools.    Historical Provider, MD   BP 123/66 mmHg  Pulse 83  Temp(Src) 98.6 F (37 C) (Oral)  Resp 16  Ht 5\' 9"  (1.753 m)  Wt 252 lb 3.2 oz (114.397 kg)  BMI 37.23 kg/m2  SpO2 98% Physical Exam  Constitutional: He is oriented to person, place, and time. He appears well-developed and well-nourished. No distress.  HENT:  Head: Normocephalic and atraumatic.  Mouth/Throat: No oropharyngeal exudate.  Eyes: Pupils are equal, round, and reactive to light.  Neck: Normal range of motion. Neck supple.  Cardiovascular: Normal rate, regular rhythm and normal heart sounds.  Exam reveals no gallop and no friction rub.   No murmur heard. Pulmonary/Chest: Effort normal and breath sounds normal. No respiratory distress. He has no wheezes. He has no rales.  Abdominal: Soft. Bowel sounds are normal. He exhibits no distension and no mass. There is no tenderness. There is no rebound and no guarding.  Musculoskeletal: Normal range of motion. He exhibits no edema or tenderness.  Neurological: He is alert and oriented to person, place, and time.  Skin: Skin is warm and dry.  Psychiatric: He has a normal mood and affect.    ED Course  Procedures (including critical care time) Labs Review Labs Reviewed  CBC WITH DIFFERENTIAL/PLATELET - Abnormal; Notable for the following:    RBC 3.52 (*)    Hemoglobin 10.9 (*)    HCT 34.3 (*)    RDW 17.0 (*)    Neutrophils Relative % 88 (*)    Lymphocytes Relative 9 (*)    Lymphs Abs 0.6 (*)    All other components within normal limits  BASIC METABOLIC PANEL - Abnormal; Notable for the following:    Glucose, Bld 116 (*)    Calcium 8.1 (*)    GFR calc non Af Amer 78 (*)    All other components within normal limits  URINE CULTURE  CULTURE, BLOOD (ROUTINE X 2)  CULTURE, BLOOD (ROUTINE X 2)  RESPIRATORY VIRUS PANEL  CULTURE, EXPECTORATED SPUTUM-ASSESSMENT  URINALYSIS, ROUTINE W REFLEX MICROSCOPIC  MAGNESIUM  PHOSPHORUS  TSH  COMPREHENSIVE  METABOLIC PANEL  CBC  I-STAT CG4 LACTIC ACID, ED    Imaging Review Dg Chest Port 1 View  10/13/2014   CLINICAL DATA:  Productive cough. Fever. Undergoing chemotherapy for bladder carcinoma.  EXAM: PORTABLE CHEST - 1 VIEW  COMPARISON:  08/28/2014  FINDINGS: The heart size and mediastinal contours are within normal limits. Both lungs are clear. Previously seen infiltrate in left lung base has resolved. No evidence of pleural effusion. Power port remains in appropriate position.  IMPRESSION: No active disease.   Electronically Signed   By: Earle Gell M.D.   On: 10/13/2014 19:42     EKG Interpretation None      MDM   Final diagnoses:  FUO (fever of unknown origin)  SIRS (systemic inflammatory response syndrome)    Pt is a 79 y.o. male with Pmhx as above who presents with bladder cancer undergoing chemotherapy, who presents with a fever since yesterday up to 100.7 orally at home.  She's also had myalgias and joint pains.  He traveled to [facility] earlier this week.  He spoke with his GU oncologist at [facility]  Dr. Aline Brochure.  He referred him to the emergency room.  He has had a productive cough for several days, denies abdominal pain, urinary symptoms, diarrhea, had some rare nausea this morning without vomiting.  Last chemotherapy treatment was 3 days ago.  Per sepsis protocol initiated given fever with immunocompromise, and presence of a port.  Broad spectrum abx ordered.   Pt is not neutropenic, WBC nml, LA nml. [facility] consulted for admission.      Ernestina Patches, MD 10/14/14 [phone]

## 2014-10-13 NOTE — ED Notes (Signed)
Pts family states pt will use CPAP at night and would like RT to inspect machine for inpatient use. Per Janett Billow RT CPAP machine needs to be inspected by [employer].

## 2014-10-13 NOTE — ED Notes (Signed)
Bed: WA16 Expected date:  Expected time:  Means of arrival:  Comments: Hold for triage 1 

## 2014-10-14 ENCOUNTER — Other Ambulatory Visit: Payer: Self-pay | Admitting: Cardiovascular Disease

## 2014-10-14 DIAGNOSIS — C7800 Secondary malignant neoplasm of unspecified lung: Secondary | ICD-10-CM

## 2014-10-14 DIAGNOSIS — R5081 Fever presenting with conditions classified elsewhere: Secondary | ICD-10-CM

## 2014-10-14 DIAGNOSIS — C779 Secondary and unspecified malignant neoplasm of lymph node, unspecified: Secondary | ICD-10-CM

## 2014-10-14 DIAGNOSIS — E871 Hypo-osmolality and hyponatremia: Secondary | ICD-10-CM

## 2014-10-14 LAB — COMPREHENSIVE METABOLIC PANEL
ALT: 22 U/L (ref 0–53)
AST: 26 U/L (ref 0–37)
Albumin: 3.1 g/dL — ABNORMAL LOW (ref 3.5–5.2)
Alkaline Phosphatase: 44 U/L (ref 39–117)
Anion gap: 7 (ref 5–15)
BUN: 25 mg/dL — AB (ref 6–23)
CO2: 22 mmol/L (ref 19–32)
Calcium: 7.8 mg/dL — ABNORMAL LOW (ref 8.4–10.5)
Chloride: 103 mmol/L (ref 96–112)
Creatinine, Ser: 1.32 mg/dL (ref 0.50–1.35)
GFR calc Af Amer: 59 mL/min — ABNORMAL LOW (ref >90)
GFR, EST NON AFRICAN AMERICAN: 51 mL/min — AB (ref >90)
GLUCOSE: 118 mg/dL — AB (ref 70–99)
Potassium: 3.7 mmol/L (ref 3.5–5.1)
Sodium: 132 mmol/L — ABNORMAL LOW (ref 135–145)
TOTAL PROTEIN: 6.5 g/dL (ref 6.0–8.3)
Total Bilirubin: 1.5 mg/dL — ABNORMAL HIGH (ref 0.3–1.2)

## 2014-10-14 LAB — CBC
HEMATOCRIT: 34.6 % — AB (ref 39.0–52.0)
HEMOGLOBIN: 11.1 g/dL — AB (ref 13.0–17.0)
MCH: 31.2 pg (ref 26.0–34.0)
MCHC: 32.1 g/dL (ref 30.0–36.0)
MCV: 97.2 fL (ref 78.0–100.0)
Platelets: 123 10*3/uL — ABNORMAL LOW (ref 150–400)
RBC: 3.56 MIL/uL — ABNORMAL LOW (ref 4.22–5.81)
RDW: 17 % — AB (ref 11.5–15.5)
WBC: 9.5 10*3/uL (ref 4.0–10.5)

## 2014-10-14 LAB — TSH: TSH: 3.426 u[IU]/mL (ref 0.350–4.500)

## 2014-10-14 LAB — PHOSPHORUS: Phosphorus: 3.5 mg/dL (ref 2.3–4.6)

## 2014-10-14 LAB — MAGNESIUM: Magnesium: 1.5 mg/dL (ref 1.5–2.5)

## 2014-10-14 LAB — EXPECTORATED SPUTUM ASSESSMENT W REFEX TO RESP CULTURE

## 2014-10-14 LAB — INFLUENZA PANEL BY PCR (TYPE A & B)
H1N1 flu by pcr: NOT DETECTED
Influenza A By PCR: NEGATIVE
Influenza B By PCR: NEGATIVE

## 2014-10-14 MED ORDER — SODIUM CHLORIDE 0.9 % IV BOLUS (SEPSIS)
500.0000 mL | Freq: Once | INTRAVENOUS | Status: AC
  Administered 2014-10-14: 500 mL via INTRAVENOUS

## 2014-10-14 MED ORDER — IBUPROFEN 600 MG PO TABS
600.0000 mg | ORAL_TABLET | Freq: Once | ORAL | Status: AC
  Administered 2014-10-14: 600 mg via ORAL
  Filled 2014-10-14: qty 1

## 2014-10-14 MED ORDER — SODIUM CHLORIDE 0.9 % IV SOLN
INTRAVENOUS | Status: DC
  Administered 2014-10-14 – 2014-10-16 (×3): via INTRAVENOUS

## 2014-10-14 NOTE — Progress Notes (Signed)
Temperature rechecked at 0225 and same was 103.7 F. Cool compress placed under bilateral armpits. Will continue to monitor temperature

## 2014-10-14 NOTE — Progress Notes (Signed)
[facility] PROGRESS NOTE  James [name] WER:[id number] DOB: Jan 08, 1936 DOA: 10/13/2014 PCP: Dione Plover, MD  Assessment/Plan: 1. Systemic inflammatory response syndrome.  -Patient presenting with a temperature of 103 with development of acute kidney injury. Workup thus far has not revealed an obvious source of infection. Chest x-ray, urinalysis, negative. Labs showing a white count of 9500. Blood cultures and urine cultures are currently pending. -His wife reported increased sputum production over the past week, flu swab currently pending, was started on Tamiflu therapy. -Patient having a hospitalization in January 2016 for fever of unknown origin undergoing extensive workup without ever finding the source. -He was recently started on Taxol therapy at [facility], as I wonder about the possibility of this medication causing fevers. -Infectious disease consulted, will continue IV vancomycin, Zosyn, follow-up on cultures, await further recommendations from infectious disease.  2.  Acute kidney injury -Labs showing a creatinine of 1.32 with BUN of 25, increased from 19 and 0.97. -Could be related to hypovolemia, will give a 500 mL bolus of normal saline and continue maintenance fluids  -Continue to monitor volume status.  3.  Hyponatremia.  -Labs showing sodium of 132 which could be related to hypovolemia -Continue IV fluids  4.  Hypertension. -Patient's blood pressures soft, will discontinue carvedilol 6.25 mg by mouth twice a day  5.  Metastatic bladder cancer -Patient currently receiving his care at [facility], recently started on Taxol therapy. -His wife expresses interest in possible transition to palliative care, however, would like to discuss this further with his oncologist at [facility]  Code Status: Full code Family Communication: I spoke to his wife was present at bedside Disposition Plan: Continue empiric IV antimicrobial therapy,  follow-up on blood cultures and urine cultures, infectious disease consultation   Consultants:  Infectious disease   Antibiotics:  Vancomycin  Zosyn  HPI/Subjective: Patient is a pleasant 79 year old gentleman with a past medical history of metastatic bladder cancer receiving his care at [facility], recently started on Taxol therapy admitted to the medicine service on 10/13/2014 presenting with temperature of 103. Patient was hospitalized at [facility] on 08/28/2014, discharged 09/04/2014 where he underwent workup for fever of unknown origin. During that hospitalization he received empiric IV and microbial therapy with echo myself and cefepime. He underwent an extensive workup which included lumbar puncture, source of infection was not found. During that hospitalization he was seen by Dr. Megan Salon of infectious disease. Initial lab work performed at this facility has not shown obvious source of infection as urinalysis, chest x-ray, have been unremarkable. Labs revealed a white count of 9.5. Blood cultures and urine cultures were obtained and are currently pending.   Objective: Filed Vitals:   10/14/14 0718  BP:   Pulse:   Temp: 98.5 F (36.9 C)  Resp:     Intake/Output Summary (Last 24 hours) at 10/14/14 1017 Last data filed at 10/14/14 0424  Gross per 24 hour  Intake      0 ml  Output    500 ml  Net   -500 ml   Filed Weights   10/13/14 2315  Weight: 114.397 kg (252 lb 3.2 oz)    Exam:   General:  No acute distress, patient is arousable to voice, ill-appearing  Cardiovascular: Regular rate and rhythm normal S1-S2 no murmurs rubs gallops  Respiratory: Lungs were clear to auscultation bilaterally I do not auscultate wheezing, rhonchi, rales, crackles  Abdomen: Soft, nontender, nondistended, patient having benign  abdominal exam  Musculoskeletal: Trace edema to lower extremities bilaterally  Data Reviewed: Basic Metabolic Panel:  Recent  Labs Lab 10/13/14 1803 10/14/14 0500  NA 135 132*  K 4.0 3.7  CL 103 103  CO2 25 22  GLUCOSE 116* 118*  BUN 19 25*  CREATININE 0.97 1.32  CALCIUM 8.1* 7.8*  MG  --  1.5  PHOS  --  3.5   Liver Function Tests:  Recent Labs Lab 10/14/14 0500  AST 26  ALT 22  ALKPHOS 44  BILITOT 1.5*  PROT 6.5  ALBUMIN 3.1*   No results for input(s): LIPASE, AMYLASE in the last 168 hours. No results for input(s): AMMONIA in the last 168 hours. CBC:  Recent Labs Lab 10/13/14 1803 10/14/14 0500  WBC 7.2 9.5  NEUTROABS 6.3  --   HGB 10.9* 11.1*  HCT 34.3* 34.6*  MCV 97.4 97.2  PLT 150 123*   Cardiac Enzymes: No results for input(s): CKTOTAL, CKMB, CKMBINDEX, TROPONINI in the last 168 hours. BNP (last 3 results)  Recent Labs  08/29/14 0135  BNP 117.9*    ProBNP (last 3 results) No results for input(s): PROBNP in the last 8760 hours.  CBG: No results for input(s): GLUCAP in the last 168 hours.  No results found for this or any previous visit (from the past 240 hour(s)).   Studies: Dg Chest Port 1 View  10/13/2014   CLINICAL DATA:  Productive cough. Fever. Undergoing chemotherapy for bladder carcinoma.  EXAM: PORTABLE CHEST - 1 VIEW  COMPARISON:  08/28/2014  FINDINGS: The heart size and mediastinal contours are within normal limits. Both lungs are clear. Previously seen infiltrate in left lung base has resolved. No evidence of pleural effusion. Power port remains in appropriate position.  IMPRESSION: No active disease.   Electronically Signed   By: Earle Gell M.D.   On: 10/13/2014 19:42    Scheduled Meds: . sodium chloride   Intravenous STAT  . aspirin EC  81 mg Oral Daily  . atorvastatin  40 mg Oral q1800  . carvedilol  6.25 mg Oral BID  . enoxaparin (LOVENOX) injection  40 mg Subcutaneous QHS  . guaiFENesin  600 mg Oral BID  . oseltamivir  75 mg Oral BID  . piperacillin-tazobactam (ZOSYN)  IV  3.375 g Intravenous Q8H  . predniSONE  10 mg Oral Q breakfast  .  sertraline  150 mg Oral Daily  . vancomycin  750 mg Intravenous Q12H  . venlafaxine XR  150 mg Oral Daily   Continuous Infusions:   Active Problems:   Coronary atherosclerosis of native coronary artery   Obstructive sleep apnea   Metastasis from bladder cancer   SIRS (systemic inflammatory response syndrome)    Time spent: 35 minutes    Kelvin Cellar  [facility] Pager [phone]. If 7PM-7AM, please contact night-coverage at [email or web address], password [facility] 10/14/2014, 10:17 AM  LOS: 1 day

## 2014-10-14 NOTE — Progress Notes (Signed)
Pt has home CPAP and will self administer when ready for bed.  RT provided sterile water for humidity chamber.  Pt to call if needs any assistance.  RT to monitor and assess as needed.

## 2014-10-14 NOTE — Progress Notes (Signed)
Pt is currently on home CPAP at home settings, pt tolerating well at this time.  RT inspected machine and wires for frays or defects and none were found.  Machine is working properly and pt has no complaints.  [employer] to inspect in the am.  RT to monitor and assess as needed.

## 2014-10-14 NOTE — Consult Note (Signed)
[facility] for Infectious Disease     Reason for Consult:fever    Referring Physician: Dr. Coralyn Pear  Active Problems:   Coronary atherosclerosis of native coronary artery   Obstructive sleep apnea   Metastasis from bladder cancer   SIRS (systemic inflammatory response syndrome)   . aspirin EC  81 mg Oral Daily  . atorvastatin  40 mg Oral q1800  . enoxaparin (LOVENOX) injection  40 mg Subcutaneous QHS  . guaiFENesin  600 mg Oral BID  . oseltamivir  75 mg Oral BID  . piperacillin-tazobactam (ZOSYN)  IV  3.375 g Intravenous Q8H  . predniSONE  10 mg Oral Q breakfast  . sertraline  150 mg Oral Daily  . vancomycin  750 mg Intravenous Q12H  . venlafaxine XR  150 mg Oral Daily    Recommendations: Continue with broad spectrum antibiotics and Tamiflu for now   Assessment: He has fever and no obvious source. Viral illness possible with cough, congestion.    Patient asking for more pain control if BP can take it.    70 mintues spent with patient and his wife and all questions answered.  History mainly from wife.    Antibiotics: Vancomycin, zosyn, Tamiflu  HPI: James [name] is a 79 y.o. male with metastatic recurrent bladder cancer with mets to lung and lymph nodes followed at [facility] by Dr. Aline Brochure.  He remotely had bladder cancer and was in remission until about 2 years ago. He has been on several treatments including immunotherapy and most recently started Taxol with goal of possible treatment and palliation. Since Taxol, he has noted rash diffusely, worsening over last 24 hours, fever, cough with productive sputum.  No sick contacts.  Came in to ED yesterday with those symptoms.  WBC 7.2, lactate wnl, flu pending.  No oral lesions, no diarrhea, no urinary dyscomfort.     Review of Systems: A comprehensive review of systems was negative.  Past Medical History  Diagnosis Date  . Atrial flutter 05/2009    STATUS POST CARDIOVERSION;  s/p RFCA in 2012 (Dr. Caryl Comes)  . Depression    . History of immunotherapy 2014    "had many times before and it worked; had a severe reaction 02/09/2013; they coded me, I had sepsis; almost died" (2013-08-04)  . Bladder cancer     'started chemo 07/2013 " (08-04-13)  . OSA on CPAP   . Pneumonia   . CAD (coronary artery disease)     a. NSTEMI 07/2013:  LHC (08/03/13): mLAD 95% assoc w/ thrombus, dLAD 50%, pRCA 80%, mRCA 50%, normal LVF. => PCI:  Rebel (4 x 12 mm) BMS to the mLAD and Rebel (4 x 15 mm) BMS to the pRCA.  . Osteoarthritis   . Dyslipidemia     Lipitor started at time of NSTEMI 07/2013    History  Substance Use Topics  . Smoking status: Former Smoker -- 1.50 packs/day for 15 years    Types: Cigarettes  . Smokeless tobacco: Never Used     Comment: 04-Aug-2013 "quit smoking in 1984"  . Alcohol Use: No     Comment: 10/13/2014 "hx of abuse; clean since 05/29/1981"    Family History  Problem Relation Age of Onset  . Heart failure Father   . Stroke Father   . Kidney failure Mother   . Coronary artery disease     Allergies  Allergen Reactions  . Morphine Nausea Only    OK if given slowly.  . Other Other (See  Comments)    Patient states when he takes flomax and protonix together makes him break out in a rash  . Adhesive [Tape] Itching and Rash    Blisters, tears skin  . Bupropion Hives and Rash  . Flomax [Tamsulosin Hcl] Rash    When taken with protonix only patient can take this alone  . Pantoprazole Sodium Rash    When taken with flomax only patient can take this alone  . Tamsulosin Rash    When taken with protonix only patient can take this alone    OBJECTIVE: Blood pressure 97/59, pulse 72, temperature 97.2 F (36.2 C), temperature source Oral, resp. rate 18, height 5\' 9"  (1.753 m), weight 252 lb 3.2 oz (114.397 kg), SpO2 100 %. General: awake, alert, nad Skin: diffuse blanching erythematous rash, mainly confluent but patchy on upper extremities Lungs: CTA B Cor: RRR Abdomen: obese, nt, nd Ext: no  edema HEENT: no oral lesions, + tooth decay  Microbiology: Recent Results (from the past 240 hour(s))  Culture, expectorated sputum-assessment     Status: None   Collection Time: 10/14/14  1:54 PM  Result Value Ref Range Status   Specimen Description SPUTUM  Final   Special Requests Immunocompromised  Final   Sputum evaluation   Final    THIS SPECIMEN IS ACCEPTABLE. RESPIRATORY CULTURE REPORT TO FOLLOW.   Report Status 10/14/2014 FINAL  Final    COMER, Herbie [place], [facility] [email or web address] [phone] pager  [phone] cell 10/14/2014, 4:25 PM

## 2014-10-14 NOTE — Progress Notes (Signed)
Temperature rechecked at approximately 0445 and same was 103 F. A tepid sponge bath was given. Next dose of Tylenol due at approximately 0625. Info sent to NP [place].

## 2014-10-14 NOTE — Progress Notes (Signed)
Pt's blood pressure now 104/56 after second saline bolus.  Will continue to monitor.  Zandra Abts [facility]  10/14/2014  12:45 PM

## 2014-10-14 NOTE — Progress Notes (Signed)
Patient had a one time dose of  Ibuprofen 600 mg at approximately 0555. Temperature at that time was 101.3 F. Will continue to monitor temperature

## 2014-10-14 NOTE — Progress Notes (Signed)
Prior to saline bolus, pt's blood pressure was 86/42.  After 500 cc bolus, blood pressure is 86/58.  Dr. Coralyn Pear notified and order received for another 500 cc bolus.  Zandra Abts [facility]  10/14/2014  12:01 PM

## 2014-10-14 NOTE — Progress Notes (Signed)
Patient arrived on the unit at approximately 2203 and accompanied by spouse. Patient is alert and verbally responsive. Noted productive cough with thick yellow sputum. Patient currently using CPAP machine from home. Had a shivering episode a few minutes after arriving and had Ativan 1 mg po as per spouse request. Patient was covered with warm blankets with good effect. Temperature was done at approximately 0100 as per spouse request and same was 102.6 F Tylenol 650 mg po was given at 0114. Will follow up on effect.

## 2014-10-15 DIAGNOSIS — E860 Dehydration: Secondary | ICD-10-CM

## 2014-10-15 LAB — BASIC METABOLIC PANEL
ANION GAP: 7 (ref 5–15)
Anion gap: 7 (ref 5–15)
BUN: 34 mg/dL — ABNORMAL HIGH (ref 6–23)
BUN: 29 mg/dL — AB (ref 6–23)
CHLORIDE: 105 mmol/L (ref 96–112)
CO2: 22 mmol/L (ref 19–32)
CO2: 21 mmol/L (ref 19–32)
CREATININE: 1.66 mg/dL — AB (ref 0.50–1.35)
Calcium: 8 mg/dL — ABNORMAL LOW (ref 8.4–10.5)
Calcium: 8.1 mg/dL — ABNORMAL LOW (ref 8.4–10.5)
Chloride: 105 mmol/L (ref 96–112)
Creatinine, Ser: 1.84 mg/dL — ABNORMAL HIGH (ref 0.50–1.35)
GFR calc Af Amer: 39 mL/min — ABNORMAL LOW (ref >90)
GFR calc Af Amer: 45 mL/min — ABNORMAL LOW (ref >90)
GFR calc non Af Amer: 34 mL/min — ABNORMAL LOW (ref >90)
GFR calc non Af Amer: 38 mL/min — ABNORMAL LOW (ref >90)
GLUCOSE: 144 mg/dL — AB (ref 70–99)
Glucose, Bld: 116 mg/dL — ABNORMAL HIGH (ref 70–99)
Potassium: 3.7 mmol/L (ref 3.5–5.1)
Potassium: 4.1 mmol/L (ref 3.5–5.1)
Sodium: 134 mmol/L — ABNORMAL LOW (ref 135–145)
Sodium: 133 mmol/L — ABNORMAL LOW (ref 135–145)

## 2014-10-15 LAB — CBC
HCT: 29.7 % — ABNORMAL LOW (ref 39.0–52.0)
Hemoglobin: 9.8 g/dL — ABNORMAL LOW (ref 13.0–17.0)
MCH: 31.9 pg (ref 26.0–34.0)
MCHC: 33 g/dL (ref 30.0–36.0)
MCV: 96.7 fL (ref 78.0–100.0)
Platelets: 94 10*3/uL — ABNORMAL LOW (ref 150–400)
RBC: 3.07 MIL/uL — ABNORMAL LOW (ref 4.22–5.81)
RDW: 17 % — ABNORMAL HIGH (ref 11.5–15.5)
WBC: 6.8 10*3/uL (ref 4.0–10.5)

## 2014-10-15 LAB — URINE CULTURE
Colony Count: NO GROWTH
Culture: NO GROWTH

## 2014-10-15 LAB — VANCOMYCIN, TROUGH: VANCOMYCIN TR: 15.7 ug/mL (ref 10.0–20.0)

## 2014-10-15 MED ORDER — SODIUM CHLORIDE 0.9 % IV BOLUS (SEPSIS)
1000.0000 mL | Freq: Once | INTRAVENOUS | Status: AC
  Administered 2014-10-15: 1000 mL via INTRAVENOUS

## 2014-10-15 MED ORDER — HEPARIN SODIUM (PORCINE) 5000 UNIT/ML IJ SOLN
5000.0000 [IU] | Freq: Three times a day (TID) | INTRAMUSCULAR | Status: DC
  Administered 2014-10-15 – 2014-10-19 (×13): 5000 [IU] via SUBCUTANEOUS
  Filled 2014-10-15 (×15): qty 1

## 2014-10-15 MED ORDER — VANCOMYCIN HCL IN DEXTROSE 750-5 MG/150ML-% IV SOLN
750.0000 mg | Freq: Two times a day (BID) | INTRAVENOUS | Status: DC
  Administered 2014-10-15 – 2014-10-16 (×2): 750 mg via INTRAVENOUS
  Filled 2014-10-15 (×2): qty 150

## 2014-10-15 NOTE — Progress Notes (Signed)
Pt has placed himself on cpap for the night and is resting comfortably at this time

## 2014-10-15 NOTE — Progress Notes (Signed)
[facility] PROGRESS NOTE  James [name] KGM:[id number] DOB: [birth year] DOA: 10/13/2014 PCP: Dione Plover, MD  Assessment/Plan: 1. Systemic inflammatory response syndrome.  -Patient presenting with a temperature of 103 with development of acute kidney injury. Workup thus far has not revealed an obvious source of infection. Chest x-ray, urinalysis, negative. Labs showing a white count of 9500. Blood cultures and urine cultures are currently pending. -Blood cultures remain negative -His wife reported increased sputum production over the past week, however, flu swab came back negative for which Tamiflu will be stopped -He has been afebrile for the past 24 hours, he remains of broad spectrum coverage. ID following, perhaps this reflected viral illness and IV antimicrobials can be simplified or stopped, will await further recommendations.   2.  Acute kidney injury -AM labs showing an upward trend in creatinine to 1.84 from 1.32 which could secondary to ATN, he was hypotensive on 10/13/2013 that responded to IV fluid boluses -Will bolus with another 1 liter of NS today -Continue to monitor volume status.  3.  Hyponatremia.  -Labs showing sodium of 132 which could be related to hypovolemia -Continue IV fluids  4.  Hypertension. -Blood pressures remain soft, will continue holding antihypertensives.   5.  Metastatic bladder cancer -Patient currently receiving his care at [facility], recently started on Taxol therapy. -His wife expresses interest in possible transition to palliative care, however, would like to discuss this further with his oncologist at [facility]  Code Status: Full code Family Communication: I spoke to his wife was present at bedside Disposition Plan: Continue empiric IV antimicrobial therapy, follow-up on blood cultures and urine cultures, infectious disease consultation   Consultants:  Infectious  disease   Antibiotics:  Vancomycin  Zosyn  HPI/Subjective: Patient is a pleasant 79 year old gentleman with a past medical history of metastatic bladder cancer receiving his care at [facility], recently started on Taxol therapy admitted to the medicine service on 10/13/2014 presenting with temperature of 103. Patient was hospitalized at [facility] on 08/28/2014, discharged 09/04/2014 where he underwent workup for fever of unknown origin. During that hospitalization he received empiric IV and microbial therapy with echo myself and cefepime. He underwent an extensive workup which included lumbar puncture, source of infection was not found. During that hospitalization he was seen by Dr. Megan Salon of infectious disease. Initial lab work performed at this facility has not shown obvious source of infection as urinalysis, chest x-ray, have been unremarkable. Labs revealed a white count of 9.5. Blood cultures and urine cultures were obtained and are currently pending.   Objective: Filed Vitals:   10/15/14 0618  BP: 109/55  Pulse: 85  Temp: 98.8 F (37.1 C)  Resp: 16    Intake/Output Summary (Last 24 hours) at 10/15/14 1208 Last data filed at 10/15/14 6440  Gross per 24 hour  Intake 298.75 ml  Output   1000 ml  Net -701.25 ml   Filed Weights   10/13/14 2315  Weight: 114.397 kg (252 lb 3.2 oz)    Exam:   General:  No acute distress, he appears better today, is awake and alert, states feeling better  Cardiovascular: Regular rate and rhythm normal S1-S2 no murmurs rubs gallops  Respiratory: Lungs were clear to auscultation bilaterally I do not auscultate wheezing, rhonchi, rales, crackles  Abdomen: Soft, nontender, nondistended, patient having benign abdominal exam  Musculoskeletal: Trace edema to lower extremities bilaterally  Data Reviewed: Basic Metabolic Panel:  Recent Labs Lab 10/13/14 1803 10/14/14  0500 10/15/14 0510  NA 135 132* 134*  K  4.0 3.7 3.7  CL 103 103 105  CO2 25 22 22   GLUCOSE 116* 118* 116*  BUN 19 25* 34*  CREATININE 0.97 1.32 1.84*  CALCIUM 8.1* 7.8* 8.0*  MG  --  1.5  --   PHOS  --  3.5  --    Liver Function Tests:  Recent Labs Lab 10/14/14 0500  AST 26  ALT 22  ALKPHOS 44  BILITOT 1.5*  PROT 6.5  ALBUMIN 3.1*   No results for input(s): LIPASE, AMYLASE in the last 168 hours. No results for input(s): AMMONIA in the last 168 hours. CBC:  Recent Labs Lab 10/13/14 1803 10/14/14 0500 10/15/14 0510  WBC 7.2 9.5 6.8  NEUTROABS 6.3  --   --   HGB 10.9* 11.1* 9.8*  HCT 34.3* 34.6* 29.7*  MCV 97.4 97.2 96.7  PLT 150 123* 94*   Cardiac Enzymes: No results for input(s): CKTOTAL, CKMB, CKMBINDEX, TROPONINI in the last 168 hours. BNP (last 3 results)  Recent Labs  08/29/14 0135  BNP 117.9*    ProBNP (last 3 results) No results for input(s): PROBNP in the last 8760 hours.  CBG: No results for input(s): GLUCAP in the last 168 hours.  Recent Results (from the past 240 hour(s))  Culture, blood (routine x 2)     Status: None (Preliminary result)   Collection Time: 10/13/14  6:03 PM  Result Value Ref Range Status   Specimen Description BLOOD RIGHT ARM  Final   Special Requests BOTTLES DRAWN AEROBIC AND ANAEROBIC 5CC  Final   Culture   Final           BLOOD CULTURE RECEIVED NO GROWTH TO DATE CULTURE WILL BE HELD FOR 5 DAYS BEFORE ISSUING A FINAL NEGATIVE REPORT Performed at [facility]    Report Status PENDING  Incomplete  Culture, blood (routine x 2)     Status: None (Preliminary result)   Collection Time: 10/13/14  6:23 PM  Result Value Ref Range Status   Specimen Description BLOOD  Final   Special Requests BOTTLES DRAWN AEROBIC ONLY  Final   Culture   Final           BLOOD CULTURE RECEIVED NO GROWTH TO DATE CULTURE WILL BE HELD FOR 5 DAYS BEFORE ISSUING A FINAL NEGATIVE REPORT Performed at [facility]    Report Status PENDING  Incomplete  Urine culture      Status: None   Collection Time: 10/13/14  8:53 PM  Result Value Ref Range Status   Specimen Description URINE, CLEAN CATCH  Final   Special Requests NONE  Final   Colony Count NO GROWTH Performed at [facility]   Final   Culture NO GROWTH Performed at [facility]   Final   Report Status 10/15/2014 FINAL  Final  Culture, expectorated sputum-assessment     Status: None   Collection Time: 10/14/14  1:54 PM  Result Value Ref Range Status   Specimen Description SPUTUM  Final   Special Requests Immunocompromised  Final   Sputum evaluation   Final    THIS SPECIMEN IS ACCEPTABLE. RESPIRATORY CULTURE REPORT TO FOLLOW.   Report Status 10/14/2014 FINAL  Final  Culture, respiratory (NON-Expectorated)     Status: None (Preliminary result)   Collection Time: 10/14/14  1:54 PM  Result Value Ref Range Status   Specimen Description SPUTUM  Final   Special Requests NONE  Final  Gram Stain PENDING  Incomplete   Culture   Final    Culture reincubated for better growth Performed at [facility]    Report Status PENDING  Incomplete     Studies: Dg Chest Port 1 View  10/13/2014   CLINICAL DATA:  Productive cough. Fever. Undergoing chemotherapy for bladder carcinoma.  EXAM: PORTABLE CHEST - 1 VIEW  COMPARISON:  08/28/2014  FINDINGS: The heart size and mediastinal contours are within normal limits. Both lungs are clear. Previously seen infiltrate in left lung base has resolved. No evidence of pleural effusion. Power port remains in appropriate position.  IMPRESSION: No active disease.   Electronically Signed   By: Earle Gell M.D.   On: 10/13/2014 19:42    Scheduled Meds: . aspirin EC  81 mg Oral Daily  . atorvastatin  40 mg Oral q1800  . enoxaparin (LOVENOX) injection  40 mg Subcutaneous QHS  . guaiFENesin  600 mg Oral BID  . oseltamivir  75 mg Oral BID  . piperacillin-tazobactam (ZOSYN)  IV  3.375 g Intravenous Q8H  . predniSONE  10 mg Oral Q breakfast  . sertraline   150 mg Oral Daily  . venlafaxine XR  150 mg Oral Daily   Continuous Infusions: . sodium chloride 75 mL/hr at 10/14/14 1910    Active Problems:   Coronary atherosclerosis of native coronary artery   Obstructive sleep apnea   Metastasis from bladder cancer   SIRS (systemic inflammatory response syndrome)    Time spent: 30 minutes    Kelvin Cellar  [facility] Pager [phone]. If 7PM-7AM, please contact night-coverage at [email or web address], password [facility] 10/15/2014, 12:08 PM  LOS: 2 days

## 2014-10-15 NOTE — Progress Notes (Signed)
ANTIBIOTIC CONSULT NOTE - Follow up  Pharmacy Consult for Vancomycin and Zosyn Indication: HCAP  Allergies  Allergen Reactions  . Morphine Nausea Only    OK if given slowly.  . Other Other (See Comments)    Patient states when he takes flomax and protonix together makes him break out in a rash  . Adhesive [Tape] Itching and Rash    Blisters, tears skin  . Bupropion Hives and Rash  . Flomax [Tamsulosin Hcl] Rash    When taken with protonix only patient can take this alone  . Pantoprazole Sodium Rash    When taken with flomax only patient can take this alone  . Tamsulosin Rash    When taken with protonix only patient can take this alone   Patient Measurements: Height: 5\' 9"  (175.3 cm) Weight: 252 lb 3.2 oz (114.397 kg) IBW/kg (Calculated) : 70.7 Total body weight: 115kg  Vital Signs: Temp: 98.8 F (37.1 C) (03/06 0618) Temp Source: Oral (03/06 0618) BP: 109/55 mmHg (03/06 0618) Pulse Rate: 85 (03/06 0618) Intake/Output from previous day: 03/05 0701 - 03/06 0700 In: 2580 [P.O.:360; I.V.:1020; IV Piggyback:1200] Out: 1200 [Urine:1200] Intake/Output from this shift:    Labs:  Recent Labs  10/13/14 1803 10/14/14 0500 10/15/14 0510  WBC 7.2 9.5 6.8  HGB 10.9* 11.1* 9.8*  PLT 150 123* 94*  CREATININE 0.97 1.32 1.84*   Estimated Creatinine Clearance: 42.6 mL/min (by C-G formula based on Cr of 1.84). No results for input(s): VANCOTROUGH, VANCOPEAK, VANCORANDOM, GENTTROUGH, GENTPEAK, GENTRANDOM, TOBRATROUGH, TOBRAPEAK, TOBRARND, AMIKACINPEAK, AMIKACINTROU, AMIKACIN in the last 72 hours.   Microbiology: Recent Results (from the past 720 hour(s))  Culture, blood (routine x 2)     Status: None (Preliminary result)   Collection Time: 10/13/14  6:03 PM  Result Value Ref Range Status   Specimen Description BLOOD RIGHT ARM  Final   Special Requests BOTTLES DRAWN AEROBIC AND ANAEROBIC 5CC  Final   Culture   Final           BLOOD CULTURE RECEIVED NO GROWTH TO DATE CULTURE  WILL BE HELD FOR 5 DAYS BEFORE ISSUING A FINAL NEGATIVE REPORT Performed at [facility]    Report Status PENDING  Incomplete  Culture, blood (routine x 2)     Status: None (Preliminary result)   Collection Time: 10/13/14  6:23 PM  Result Value Ref Range Status   Specimen Description BLOOD  Final   Special Requests BOTTLES DRAWN AEROBIC ONLY  Final   Culture   Final           BLOOD CULTURE RECEIVED NO GROWTH TO DATE CULTURE WILL BE HELD FOR 5 DAYS BEFORE ISSUING A FINAL NEGATIVE REPORT Performed at [facility]    Report Status PENDING  Incomplete  Urine culture     Status: None   Collection Time: 10/13/14  8:53 PM  Result Value Ref Range Status   Specimen Description URINE, CLEAN CATCH  Final   Special Requests NONE  Final   Colony Count NO GROWTH Performed at [facility]   Final   Culture NO GROWTH Performed at [facility]   Final   Report Status 10/15/2014 FINAL  Final  Culture, expectorated sputum-assessment     Status: None   Collection Time: 10/14/14  1:54 PM  Result Value Ref Range Status   Specimen Description SPUTUM  Final   Special Requests Immunocompromised  Final   Sputum evaluation   Final    THIS SPECIMEN IS ACCEPTABLE.  RESPIRATORY CULTURE REPORT TO FOLLOW.   Report Status 10/14/2014 FINAL  Final  Culture, respiratory (NON-Expectorated)     Status: None (Preliminary result)   Collection Time: 10/14/14  1:54 PM  Result Value Ref Range Status   Specimen Description SPUTUM  Final   Special Requests NONE  Final   Gram Stain PENDING  Incomplete   Culture   Final    Culture reincubated for better growth Performed at [facility]    Report Status PENDING  Incomplete    Medical History: Past Medical History  Diagnosis Date  . Atrial flutter 05/2009    STATUS POST CARDIOVERSION;  s/p RFCA in 2012 (Dr. Caryl Comes)  . Depression   . History of immunotherapy 2014    "had many times before and it worked; had a severe reaction  03/09/13; they coded me, I had sepsis; almost died" (09-01-13)  . Bladder cancer     'started chemo 07/2013 " (September 01, 2013)  . OSA on CPAP   . Pneumonia   . CAD (coronary artery disease)     a. NSTEMI 07/2013:  LHC (08/03/13): mLAD 95% assoc w/ thrombus, dLAD 50%, pRCA 80%, mRCA 50%, normal LVF. => PCI:  Rebel (4 x 12 mm) BMS to the mLAD and Rebel (4 x 15 mm) BMS to the pRCA.  . Osteoarthritis   . Dyslipidemia     Lipitor started at time of NSTEMI 07/2013   Medications:  Scheduled:  . aspirin EC  81 mg Oral Daily  . atorvastatin  40 mg Oral q1800  . enoxaparin (LOVENOX) injection  40 mg Subcutaneous QHS  . guaiFENesin  600 mg Oral BID  . oseltamivir  75 mg Oral BID  . piperacillin-tazobactam (ZOSYN)  IV  3.375 g Intravenous Q8H  . predniSONE  10 mg Oral Q breakfast  . sertraline  150 mg Oral Daily  . vancomycin  750 mg Intravenous Q12H  . venlafaxine XR  150 mg Oral Daily   Anti-infectives    Start     Dose/Rate Route Frequency Ordered Stop   10/14/14 1000  vancomycin (VANCOCIN) IVPB 750 mg/150 ml premix     750 mg 150 mL/hr over 60 Minutes Intravenous Every 12 hours 10/13/14 2024     10/14/14 0400  piperacillin-tazobactam (ZOSYN) IVPB 3.375 g     3.375 g 12.5 mL/hr over 240 Minutes Intravenous Every 8 hours 10/13/14 2023     10/13/14 2345  oseltamivir (TAMIFLU) capsule 75 mg     75 mg Oral 2 times daily 10/13/14 2332 10/18/14 2159   10/13/14 1915  piperacillin-tazobactam (ZOSYN) IVPB 3.375 g     3.375 g 100 mL/hr over 30 Minutes Intravenous  Once 10/13/14 1906 10/13/14 2045   10/13/14 1915  vancomycin (VANCOCIN) IVPB 1000 mg/200 mL premix  Status:  Discontinued     1,000 mg 200 mL/hr over 60 Minutes Intravenous  Once 10/13/14 1906 10/13/14 1914   10/13/14 1915  vancomycin (VANCOCIN) 2,000 mg in sodium chloride 0.9 % 500 mL IVPB     2,000 mg 250 mL/hr over 120 Minutes Intravenous  Once 10/13/14 1915 10/13/14 2300     Assessment: 31 yoM with bladder cancer, receiving chemo  at [facility], Taxol on 3/2. To ED with fever, myalgias, joint pain, productive cough. Begin Vancomycin & Zosyn, r/o HCAP.  3/4 >> Vanc >> 3/4 >> Zosyn >>  3/4 >> Tamiflu >>  Tmax: AF WBCs: wnl Renal: SCr rose significantly to 1.84. 43CG, 34N.   3/4 resp virus swab: neg  3/4 blood x2: ngtd 3/4 urine: neg  3/5 sputum:  Goal of Therapy:  Vancomycin trough level 15-20 mcg/ml  Appropriate antibiotic dosing for renal function; eradication of infection  Plan:  Check a Vanc trough at 2100 tonight. Vanc 750mg  q12h - hold doses until level can be evaluated. Zosyn 3.375g IV Q8H infused over 4hrs. Follow up renal fxn, culture results, and clinical course.  Romeo Rabon, PharmD, pager [phone]. 10/15/2014,11:50 AM.

## 2014-10-16 ENCOUNTER — Inpatient Hospital Stay (HOSPITAL_COMMUNITY): Payer: Medicare Other

## 2014-10-16 DIAGNOSIS — R509 Fever, unspecified: Principal | ICD-10-CM

## 2014-10-16 DIAGNOSIS — A419 Sepsis, unspecified organism: Secondary | ICD-10-CM

## 2014-10-16 DIAGNOSIS — N179 Acute kidney failure, unspecified: Secondary | ICD-10-CM

## 2014-10-16 DIAGNOSIS — C799 Secondary malignant neoplasm of unspecified site: Secondary | ICD-10-CM

## 2014-10-16 DIAGNOSIS — R21 Rash and other nonspecific skin eruption: Secondary | ICD-10-CM

## 2014-10-16 DIAGNOSIS — C679 Malignant neoplasm of bladder, unspecified: Secondary | ICD-10-CM

## 2014-10-16 LAB — BASIC METABOLIC PANEL
ANION GAP: 6 (ref 5–15)
BUN: 24 mg/dL — AB (ref 6–23)
CO2: 20 mmol/L (ref 19–32)
Calcium: 8 mg/dL — ABNORMAL LOW (ref 8.4–10.5)
Chloride: 109 mmol/L (ref 96–112)
Creatinine, Ser: 1.51 mg/dL — ABNORMAL HIGH (ref 0.50–1.35)
GFR calc Af Amer: 50 mL/min — ABNORMAL LOW (ref >90)
GFR calc non Af Amer: 43 mL/min — ABNORMAL LOW (ref >90)
Glucose, Bld: 129 mg/dL — ABNORMAL HIGH (ref 70–99)
POTASSIUM: 3.8 mmol/L (ref 3.5–5.1)
Sodium: 135 mmol/L (ref 135–145)

## 2014-10-16 LAB — CBC
HEMATOCRIT: 28.3 % — AB (ref 39.0–52.0)
Hemoglobin: 9.3 g/dL — ABNORMAL LOW (ref 13.0–17.0)
MCH: 31.7 pg (ref 26.0–34.0)
MCHC: 32.9 g/dL (ref 30.0–36.0)
MCV: 96.6 fL (ref 78.0–100.0)
PLATELETS: 82 10*3/uL — AB (ref 150–400)
RBC: 2.93 MIL/uL — ABNORMAL LOW (ref 4.22–5.81)
RDW: 16.8 % — ABNORMAL HIGH (ref 11.5–15.5)
WBC: 3.4 10*3/uL — AB (ref 4.0–10.5)

## 2014-10-16 NOTE — Progress Notes (Signed)
[facility] PROGRESS NOTE  James [name] OVF:[id number] DOB: May 15, 1936 DOA: 10/13/2014 PCP: Dione Plover, MD  Assessment/Plan: 1. Systemic inflammatory response syndrome.  -James [name] presenting with a temperature of 103 with development of acute kidney injury. Workup thus far has not revealed an obvious source of infection. Chest x-ray, urinalysis, negative. Labs showing a white count of 9500. Blood cultures and urine cultures are currently pending. -Blood cultures remain negative -James [name] has been afebrile for several days now, possibilities include viral infection versus drug induced -I would be reasonable to stop IV antimicrobial therapy and observe off of antibiotics overnight  2.  Acute kidney injury -AM labs showing an upward trend in creatinine to 1.84 from 1.32 which could secondary to ATN, he was hypotensive on 10/13/2013 that responded to IV fluid boluses -James creatinine trending down to 1.51 from 1.84 yesterday after IV fluid resuscitation -Tolerating by mouth intake now, will stop IV fluids. Check chest x-ray.   3.  Hyponatremia.  -I secondary to dehydration, improved with demonstration of IV fluids  4.  Hypertension. -Blood pressures remain soft, will continue holding antihypertensives.   5.  Metastatic bladder cancer -James [name] currently receiving his care at [facility], recently started on Taxol therapy. -Case discussed with his medical oncologist Dr. Aline Brochure at [facility]. Dr. Aline Brochure supports a transition to comfort care and James desire to seek a palliative care consultation.  Code Status: Full code Family Communication: I spoke to his [name] was present at bedside Disposition Plan: Stopping IV antimicrobials, monitoring off of antibiotics overnight   Consultants:  Infectious disease  Telephone consultation made to his oncologist Dr. Aline Brochure at [facility]   Antibiotics:  Vancomycin  Zosyn  HPI/Subjective: James [name] is a pleasant 79 year old gentleman with a past medical history of metastatic bladder cancer receiving his care at [facility], recently started on Taxol therapy admitted to the medicine service on 10/13/2014 presenting with temperature of 103. James [name] was hospitalized at [facility] on 08/28/2014, discharged 09/04/2014 where he underwent workup for fever of unknown origin. During that hospitalization he received empiric IV and microbial therapy with echo myself and cefepime. He underwent an extensive workup which included lumbar puncture, source of infection was not found. During that hospitalization he was seen by Dr. Megan Salon of infectious disease. Initial lab work performed at this facility has not shown obvious source of infection as urinalysis, chest x-ray, have been unremarkable. Labs revealed a white count of 9.5. Blood cultures and urine cultures were obtained and are currently pending.   Objective: Filed Vitals:   10/16/14 1424  BP: 147/67  Pulse: 76  Temp: 98.3 F (36.8 C)  Resp: 18    Intake/Output Summary (Last 24 hours) at 10/16/14 1600 Last data filed at 10/16/14 1300  Gross per 24 hour  Intake    480 ml  Output   2450 ml  Net  -1970 ml   Filed Weights   10/13/14 2315  Weight: 114.397 kg (252 lb 3.2 oz)    Exam:   General:  No acute distress, he appears better today, is awake and alert, states feeling better  Cardiovascular: Regular rate and rhythm normal S1-S2 no murmurs rubs gallops  Respiratory: Lungs were clear to auscultation bilaterally I do not auscultate wheezing, rhonchi, rales, crackles  Abdomen: Soft, nontender, nondistended, James [name] having benign abdominal exam  Musculoskeletal: Trace edema to lower extremities bilaterally  Data Reviewed: Basic Metabolic Panel:  Recent Labs Lab 10/13/14 1803 10/14/14  0500 10/15/14 0510 10/15/14 1400  10/16/14 0813  NA 135 132* 134* 133* 135  K 4.0 3.7 3.7 4.1 3.8  CL 103 103 105 105 109  CO2 25 22 22 21 20   GLUCOSE 116* 118* 116* 144* 129*  BUN 19 25* 34* 29* 24*  CREATININE 0.97 1.32 1.84* 1.66* 1.51*  CALCIUM 8.1* 7.8* 8.0* 8.1* 8.0*  MG  --  1.5  --   --   --   PHOS  --  3.5  --   --   --    Liver Function Tests:  Recent Labs Lab 10/14/14 0500  AST 26  ALT 22  ALKPHOS 44  BILITOT 1.5*  PROT 6.5  ALBUMIN 3.1*   No results for input(s): LIPASE, AMYLASE in the last 168 hours. No results for input(s): AMMONIA in the last 168 hours. CBC:  Recent Labs Lab 10/13/14 1803 10/14/14 0500 10/15/14 0510 10/16/14 0620  WBC 7.2 9.5 6.8 3.4*  NEUTROABS 6.3  --   --   --   HGB 10.9* 11.1* 9.8* 9.3*  HCT 34.3* 34.6* 29.7* 28.3*  MCV 97.4 97.2 96.7 96.6  PLT 150 123* 94* 82*   Cardiac Enzymes: No results for input(s): CKTOTAL, CKMB, CKMBINDEX, TROPONINI in the last 168 hours. BNP (last 3 results)  Recent Labs  08/29/14 0135  BNP 117.9*    ProBNP (last 3 results) No results for input(s): PROBNP in the last 8760 hours.  CBG: No results for input(s): GLUCAP in the last 168 hours.  Recent Results (from the past 240 hour(s))  Culture, blood (routine x 2)     Status: None (Preliminary result)   Collection Time: 10/13/14  6:03 PM  Result Value Ref Range Status   Specimen Description BLOOD RIGHT ARM  Final   Special Requests BOTTLES DRAWN AEROBIC AND ANAEROBIC 5CC  Final   Culture   Final           BLOOD CULTURE RECEIVED NO GROWTH TO DATE CULTURE WILL BE HELD FOR 5 DAYS BEFORE ISSUING A FINAL NEGATIVE REPORT Performed at [facility]    Report Status PENDING  Incomplete  Culture, blood (routine x 2)     Status: None (Preliminary result)   Collection Time: 10/13/14  6:23 PM  Result Value Ref Range Status   Specimen Description BLOOD  Final   Special Requests BOTTLES DRAWN AEROBIC ONLY  Final   Culture   Final           BLOOD CULTURE RECEIVED NO GROWTH TO  DATE CULTURE WILL BE HELD FOR 5 DAYS BEFORE ISSUING A FINAL NEGATIVE REPORT Performed at [facility]    Report Status PENDING  Incomplete  Urine culture     Status: None   Collection Time: 10/13/14  8:53 PM  Result Value Ref Range Status   Specimen Description URINE, CLEAN CATCH  Final   Special Requests NONE  Final   Colony Count NO GROWTH Performed at [facility]   Final   Culture NO GROWTH Performed at [facility]   Final   Report Status 10/15/2014 FINAL  Final  Culture, expectorated sputum-assessment     Status: None   Collection Time: 10/14/14  1:54 PM  Result Value Ref Range Status   Specimen Description SPUTUM  Final   Special Requests Immunocompromised  Final   Sputum evaluation   Final    THIS SPECIMEN IS ACCEPTABLE. RESPIRATORY CULTURE REPORT TO FOLLOW.   Report Status 10/14/2014 FINAL  Final  Culture, respiratory (NON-Expectorated)     Status: None (Preliminary result)   Collection Time: 10/14/14  1:54 PM  Result Value Ref Range Status   Specimen Description SPUTUM  Final   Special Requests NONE  Final   Gram Stain PENDING  Incomplete   Culture   Final    Culture reincubated for better growth Performed at [facility]    Report Status PENDING  Incomplete     Studies: No results found.  Scheduled Meds: . aspirin EC  81 mg Oral Daily  . atorvastatin  40 mg Oral q1800  . guaiFENesin  600 mg Oral BID  . heparin subcutaneous  5,000 Units Subcutaneous 3 times per day  . predniSONE  10 mg Oral Q breakfast  . sertraline  150 mg Oral Daily  . venlafaxine XR  150 mg Oral Daily   Continuous Infusions:    Active Problems:   Coronary atherosclerosis of native coronary artery   Obstructive sleep apnea   Metastasis from bladder cancer   SIRS (systemic inflammatory response syndrome)    Time spent: 35 minutes    Kelvin Cellar  [facility] Pager [phone]. If 7PM-7AM, please contact night-coverage at  [email or web address], password [facility] 10/16/2014, 4:00 PM  LOS: 3 days

## 2014-10-16 NOTE — Progress Notes (Signed)
ANTIBIOTIC CONSULT NOTE - FOLLOW UP  Pharmacy Consult for vancomycin Indication: HCAP  Allergies  Allergen Reactions  . Morphine Nausea Only    OK if given slowly.  . Other Other (See Comments)    Patient states when he takes flomax and protonix together makes him break out in a rash  . Adhesive [Tape] Itching and Rash    Blisters, tears skin  . Bupropion Hives and Rash  . Flomax [Tamsulosin Hcl] Rash    When taken with protonix only patient can take this alone  . Pantoprazole Sodium Rash    When taken with flomax only patient can take this alone  . Tamsulosin Rash    When taken with protonix only patient can take this alone    Patient Measurements: Height: 5\' 9"  (175.3 cm) Weight: 252 lb 3.2 oz (114.397 kg) IBW/kg (Calculated) : 70.7 Adjusted Body Weight:   Vital Signs: Temp: 99 F (37.2 C) (03/07 0150) Temp Source: Oral (03/07 0150) BP: 141/69 mmHg (03/07 0150) Pulse Rate: 83 (03/07 0150) Intake/Output from previous day: 03/06 0701 - 03/07 0700 In: 480 [P.O.:480] Out: 2000 [Urine:2000] Intake/Output from this shift: Total I/O In: -  Out: 700 [Urine:700]  Labs:  Recent Labs  10/13/14 1803 10/14/14 0500 10/15/14 0510 10/15/14 1400  WBC 7.2 9.5 6.8  --   HGB 10.9* 11.1* 9.8*  --   PLT 150 123* 94*  --   CREATININE 0.97 1.32 1.84* 1.66*   Estimated Creatinine Clearance: 47.2 mL/min (by C-G formula based on Cr of 1.66).  Recent Labs  10/15/14 2120  [place] 15.7     Microbiology: Recent Results (from the past 720 hour(s))  Culture, blood (routine x 2)     Status: None (Preliminary result)   Collection Time: 10/13/14  6:03 PM  Result Value Ref Range Status   Specimen Description BLOOD RIGHT ARM  Final   Special Requests BOTTLES DRAWN AEROBIC AND ANAEROBIC 5CC  Final   Culture   Final           BLOOD CULTURE RECEIVED NO GROWTH TO DATE CULTURE WILL BE HELD FOR 5 DAYS BEFORE ISSUING A FINAL NEGATIVE REPORT Performed at [facility]    Report Status PENDING  Incomplete  Culture, blood (routine x 2)     Status: None (Preliminary result)   Collection Time: 10/13/14  6:23 PM  Result Value Ref Range Status   Specimen Description BLOOD  Final   Special Requests BOTTLES DRAWN AEROBIC ONLY  Final   Culture   Final           BLOOD CULTURE RECEIVED NO GROWTH TO DATE CULTURE WILL BE HELD FOR 5 DAYS BEFORE ISSUING A FINAL NEGATIVE REPORT Performed at [facility]    Report Status PENDING  Incomplete  Urine culture     Status: None   Collection Time: 10/13/14  8:53 PM  Result Value Ref Range Status   Specimen Description URINE, CLEAN CATCH  Final   Special Requests NONE  Final   Colony Count NO GROWTH Performed at [facility]   Final   Culture NO GROWTH Performed at [facility]   Final   Report Status 10/15/2014 FINAL  Final  Culture, expectorated sputum-assessment     Status: None   Collection Time: 10/14/14  1:54 PM  Result Value Ref Range Status   Specimen Description SPUTUM  Final   Special Requests Immunocompromised  Final   Sputum evaluation   Final    THIS  SPECIMEN IS ACCEPTABLE. RESPIRATORY CULTURE REPORT TO FOLLOW.   Report Status 10/14/2014 FINAL  Final  Culture, respiratory (NON-Expectorated)     Status: None (Preliminary result)   Collection Time: 10/14/14  1:54 PM  Result Value Ref Range Status   Specimen Description SPUTUM  Final   Special Requests NONE  Final   Gram Stain PENDING  Incomplete   Culture   Final    Culture reincubated for better growth Performed at [facility]    Report Status PENDING  Incomplete    Anti-infectives    Start     Dose/Rate Route Frequency Ordered Stop   10/15/14 2245  vancomycin (VANCOCIN) IVPB 750 mg/150 ml premix     750 mg 150 mL/hr over 60 Minutes Intravenous 2 times daily 10/15/14 2243     10/14/14 1000  vancomycin (VANCOCIN) IVPB 750 mg/150 ml premix  Status:  Discontinued     750 mg 150 mL/hr over 60 Minutes Intravenous  Every 12 hours 10/13/14 2024 10/15/14 1151   10/14/14 0400  piperacillin-tazobactam (ZOSYN) IVPB 3.375 g     3.375 g 12.5 mL/hr over 240 Minutes Intravenous Every 8 hours 10/13/14 2023     10/13/14 2345  oseltamivir (TAMIFLU) capsule 75 mg  Status:  Discontinued     75 mg Oral 2 times daily 10/13/14 2332 10/15/14 1329   10/13/14 1915  piperacillin-tazobactam (ZOSYN) IVPB 3.375 g     3.375 g 100 mL/hr over 30 Minutes Intravenous  Once 10/13/14 1906 10/13/14 2045   10/13/14 1915  vancomycin (VANCOCIN) IVPB 1000 mg/200 mL premix  Status:  Discontinued     1,000 mg 200 mL/hr over 60 Minutes Intravenous  Once 10/13/14 1906 10/13/14 1914   10/13/14 1915  vancomycin (VANCOCIN) 2,000 mg in sodium chloride 0.9 % 500 mL IVPB     2,000 mg 250 mL/hr over 120 Minutes Intravenous  Once 10/13/14 1915 10/13/14 2300      Assessment: Patient with recent increase in SCr.  VT is at lower end of goal.  Prior doses charted correctly.    Goal of Therapy:  Vancomycin trough level 15-20 mcg/ml  Plan:  Measure antibiotic drug levels at steady state Follow up culture results Restart vancomycin 750mg  iv q12hr  Recheck level in a few doses to make sure not accumulating   Tyler Deis, Shea Stakes [place] 10/16/2014,3:33 AM

## 2014-10-16 NOTE — Progress Notes (Signed)
INFECTIOUS DISEASE PROGRESS NOTE  ID: James [name] is a 79 y.o. male with  Active Problems:   Coronary atherosclerosis of native coronary artery   Obstructive sleep apnea   Metastasis from bladder cancer   SIRS (systemic inflammatory response syndrome)  Subjective: Without complaints.   Abtx:  Anti-infectives    Start     Dose/Rate Route Frequency Ordered Stop   10/15/14 2245  vancomycin (VANCOCIN) IVPB 750 mg/150 ml premix  Status:  Discontinued     750 mg 150 mL/hr over 60 Minutes Intravenous 2 times daily 10/15/14 2243 10/16/14 1116   10/14/14 1000  vancomycin (VANCOCIN) IVPB 750 mg/150 ml premix  Status:  Discontinued     750 mg 150 mL/hr over 60 Minutes Intravenous Every 12 hours 10/13/14 2024 10/15/14 1151   10/14/14 0400  piperacillin-tazobactam (ZOSYN) IVPB 3.375 g  Status:  Discontinued     3.375 g 12.5 mL/hr over 240 Minutes Intravenous Every 8 hours 10/13/14 2023 10/16/14 1116   10/13/14 2345  oseltamivir (TAMIFLU) capsule 75 mg  Status:  Discontinued     75 mg Oral 2 times daily 10/13/14 2332 10/15/14 1329   10/13/14 1915  piperacillin-tazobactam (ZOSYN) IVPB 3.375 g     3.375 g 100 mL/hr over 30 Minutes Intravenous  Once 10/13/14 1906 10/13/14 2045   10/13/14 1915  vancomycin (VANCOCIN) IVPB 1000 mg/200 mL premix  Status:  Discontinued     1,000 mg 200 mL/hr over 60 Minutes Intravenous  Once 10/13/14 1906 10/13/14 1914   10/13/14 1915  vancomycin (VANCOCIN) 2,000 mg in sodium chloride 0.9 % 500 mL IVPB     2,000 mg 250 mL/hr over 120 Minutes Intravenous  Once 10/13/14 1915 10/13/14 2300      Medications:  Scheduled: . aspirin EC  81 mg Oral Daily  . atorvastatin  40 mg Oral q1800  . guaiFENesin  600 mg Oral BID  . heparin subcutaneous  5,000 Units Subcutaneous 3 times per day  . predniSONE  10 mg Oral Q breakfast  . sertraline  150 mg Oral Daily  . venlafaxine XR  150 mg Oral Daily    Objective: Vital signs in last 24 hours: Temp:  [98.3 F (36.8  C)-99.2 F (37.3 C)] 98.3 F (36.8 C) (03/07 1424) Pulse Rate:  [76-83] 76 (03/07 1424) Resp:  [18] 18 (03/07 1424) BP: (123-147)/(60-77) 147/67 mmHg (03/07 1424) SpO2:  [97 %-99 %] 98 % (03/07 1424)   General appearance: alert, cooperative and no distress Resp: clear to auscultation bilaterally Chest wall: R anterior chest Port- non-tender, no fluctuance Cardio: regular rate and rhythm GI: normal findings: bowel sounds normal and soft, non-tender Skin: erythema - generalized  Lab Results  Recent Labs  10/15/14 0510 10/15/14 1400 10/16/14 0620 10/16/14 0813  WBC 6.8  --  3.4*  --   HGB 9.8*  --  9.3*  --   HCT 29.7*  --  28.3*  --   NA 134* 133*  --  135  K 3.7 4.1  --  3.8  CL 105 105  --  109  CO2 22 21  --  20  BUN 34* 29*  --  24*  CREATININE 1.84* 1.66*  --  1.51*   Liver Panel  Recent Labs  10/14/14 0500  PROT 6.5  ALBUMIN 3.1*  AST 26  ALT 22  ALKPHOS 44  BILITOT 1.5*   Sedimentation Rate No results for input(s): ESRSEDRATE in the last 72 hours. C-Reactive Protein No results for  input(s): CRP in the last 72 hours.  Microbiology: Recent Results (from the past 240 hour(s))  Culture, blood (routine x 2)     Status: None (Preliminary result)   Collection Time: 10/13/14  6:03 PM  Result Value Ref Range Status   Specimen Description BLOOD RIGHT ARM  Final   Special Requests BOTTLES DRAWN AEROBIC AND ANAEROBIC 5CC  Final   Culture   Final           BLOOD CULTURE RECEIVED NO GROWTH TO DATE CULTURE WILL BE HELD FOR 5 DAYS BEFORE ISSUING A FINAL NEGATIVE REPORT Performed at [facility]    Report Status PENDING  Incomplete  Culture, blood (routine x 2)     Status: None (Preliminary result)   Collection Time: 10/13/14  6:23 PM  Result Value Ref Range Status   Specimen Description BLOOD  Final   Special Requests BOTTLES DRAWN AEROBIC ONLY  Final   Culture   Final           BLOOD CULTURE RECEIVED NO GROWTH TO DATE CULTURE WILL BE HELD FOR 5 DAYS  BEFORE ISSUING A FINAL NEGATIVE REPORT Performed at [facility]    Report Status PENDING  Incomplete  Urine culture     Status: None   Collection Time: 10/13/14  8:53 PM  Result Value Ref Range Status   Specimen Description URINE, CLEAN CATCH  Final   Special Requests NONE  Final   Colony Count NO GROWTH Performed at [facility]   Final   Culture NO GROWTH Performed at [facility]   Final   Report Status 10/15/2014 FINAL  Final  Culture, expectorated sputum-assessment     Status: None   Collection Time: 10/14/14  1:54 PM  Result Value Ref Range Status   Specimen Description SPUTUM  Final   Special Requests Immunocompromised  Final   Sputum evaluation   Final    THIS SPECIMEN IS ACCEPTABLE. RESPIRATORY CULTURE REPORT TO FOLLOW.   Report Status 10/14/2014 FINAL  Final  Culture, respiratory (NON-Expectorated)     Status: None (Preliminary result)   Collection Time: 10/14/14  1:54 PM  Result Value Ref Range Status   Specimen Description SPUTUM  Final   Special Requests NONE  Final   Gram Stain PENDING  Incomplete   Culture   Final    Culture reincubated for better growth Performed at [facility]    Report Status PENDING  Incomplete    Studies/Results: No results found.   Assessment/Plan: ARF Sepsis- source? Metastatic bladder CA  Last dose of taxol-   Total days of antibiotics: 3 vanco/zosyn Off tamilflu  His CXR was negative, would consider repeat.  The etiology of his syndrome is unclear. I am not clear that his rash, fever, dropping WBC are from Taxol (drug reaction, as well as chemo effects), Vanco can also drop wbc.  Cr is better, however his WBC has come down.  Would stop respiratory isolation, his flu was (-) They would like to find a local oncologist (as opposed to traveling to [facility] for all their needs).  Having paliative care evaluate him, just for introduction, would be reasonable as well.          Bobby Rumpf Infectious Diseases (pager) [phone] [email or web address] 10/16/2014, 3:25 PM  LOS: 3 days

## 2014-10-17 DIAGNOSIS — R531 Weakness: Secondary | ICD-10-CM

## 2014-10-17 DIAGNOSIS — D72819 Decreased white blood cell count, unspecified: Secondary | ICD-10-CM

## 2014-10-17 DIAGNOSIS — R52 Pain, unspecified: Secondary | ICD-10-CM

## 2014-10-17 DIAGNOSIS — Z515 Encounter for palliative care: Secondary | ICD-10-CM

## 2014-10-17 DIAGNOSIS — G893 Neoplasm related pain (acute) (chronic): Secondary | ICD-10-CM

## 2014-10-17 DIAGNOSIS — Z7189 Other specified counseling: Secondary | ICD-10-CM

## 2014-10-17 LAB — DIFFERENTIAL
Basophils Absolute: 0 10*3/uL (ref 0.0–0.1)
Basophils Relative: 2 % — ABNORMAL HIGH (ref 0–1)
Eosinophils Absolute: 0 10*3/uL (ref 0.0–0.7)
Eosinophils Relative: 0 % (ref 0–5)
Lymphocytes Relative: 20 % (ref 12–46)
Lymphs Abs: 0.3 10*3/uL — ABNORMAL LOW (ref 0.7–4.0)
Monocytes Absolute: 0.1 10*3/uL (ref 0.1–1.0)
Monocytes Relative: 4 % (ref 3–12)
Neutro Abs: 1.2 10*3/uL — ABNORMAL LOW (ref 1.7–7.7)
Neutrophils Relative %: 74 % (ref 43–77)

## 2014-10-17 LAB — BASIC METABOLIC PANEL
Anion gap: 6 (ref 5–15)
BUN: 20 mg/dL (ref 6–23)
CO2: 21 mmol/L (ref 19–32)
CREATININE: 1.24 mg/dL (ref 0.50–1.35)
Calcium: 8.3 mg/dL — ABNORMAL LOW (ref 8.4–10.5)
Chloride: 106 mmol/L (ref 96–112)
GFR, EST AFRICAN AMERICAN: 63 mL/min — AB (ref >90)
GFR, EST NON AFRICAN AMERICAN: 54 mL/min — AB (ref >90)
Glucose, Bld: 120 mg/dL — ABNORMAL HIGH (ref 70–99)
Potassium: 4 mmol/L (ref 3.5–5.1)
Sodium: 133 mmol/L — ABNORMAL LOW (ref 135–145)

## 2014-10-17 LAB — CBC
HCT: 30.7 % — ABNORMAL LOW (ref 39.0–52.0)
HEMOGLOBIN: 10 g/dL — AB (ref 13.0–17.0)
MCH: 31.3 pg (ref 26.0–34.0)
MCHC: 32.6 g/dL (ref 30.0–36.0)
MCV: 96.2 fL (ref 78.0–100.0)
PLATELETS: 109 10*3/uL — AB (ref 150–400)
RBC: 3.19 MIL/uL — AB (ref 4.22–5.81)
RDW: 16.8 % — ABNORMAL HIGH (ref 11.5–15.5)
WBC: 1.5 10*3/uL — ABNORMAL LOW (ref 4.0–10.5)

## 2014-10-17 LAB — CULTURE, RESPIRATORY: Culture: NORMAL

## 2014-10-17 LAB — CULTURE, RESPIRATORY W GRAM STAIN

## 2014-10-17 MED ORDER — SENNOSIDES-DOCUSATE SODIUM 8.6-50 MG PO TABS: 1.0000 | ORAL_TABLET | Freq: Every evening | ORAL | Status: DC | PRN

## 2014-10-17 MED ORDER — LIDOCAINE-PRILOCAINE 2.5-2.5 % EX CREA: TOPICAL_CREAM | CUTANEOUS | Status: DC | PRN

## 2014-10-17 MED ORDER — HYDROCODONE-ACETAMINOPHEN 5-325 MG PO TABS
1.0000 | ORAL_TABLET | ORAL | Status: DC | PRN
  Administered 2014-10-18 – 2014-10-19 (×2): 1 via ORAL
  Filled 2014-10-17 (×2): qty 1

## 2014-10-17 MED ORDER — MORPHINE SULFATE ER 15 MG PO TBCR
15.0000 mg | EXTENDED_RELEASE_TABLET | Freq: Two times a day (BID) | ORAL | Status: DC
  Administered 2014-10-17 – 2014-10-20 (×7): 15 mg via ORAL
  Filled 2014-10-17 (×7): qty 1

## 2014-10-17 MED ORDER — FUROSEMIDE 10 MG/ML IJ SOLN
20.0000 mg | Freq: Once | INTRAMUSCULAR | Status: AC
Start: 2014-10-17 — End: 2014-10-17
  Administered 2014-10-17: 20 mg via INTRAVENOUS
  Filled 2014-10-17: qty 2

## 2014-10-17 NOTE — Progress Notes (Addendum)
[facility] PROGRESS NOTE  James [name] BPZ:[id number] DOB: [birth year] DOA: 10/13/2014 PCP: Dione Plover, MD  Interim Summary Patient is a pleasant 79 year old gentleman with a past medical history of metastatic bladder cancer receiving his care at [facility], recently started on Taxol therapy admitted to the medicine service on 10/13/2014 presenting with temperature of 103. Patient was hospitalized at [facility] on 08/28/2014, discharged 09/04/2014 where he underwent workup for fever of unknown origin. During that hospitalization he received empiric IV and microbial therapy with echo myself and cefepime. He underwent an extensive workup which included lumbar puncture, source of infection was not found. During that hospitalization he was seen by Dr. Megan Salon of infectious disease. Initial lab work performed at this facility has not shown obvious source of infection as urinalysis, chest x-ray, have been unremarkable. He received 4 days of broad spectrum IV antimicrobial therapy with Vancomycin and Zosyn during this hospitalization, did not spike any more fevers. Cultures remained negative. His IV antimicrobials were stopped on 10/16/2014 and followed off of antibiotics. Fever may have been related to viral syndrome or recent chemotherapy. Family expressing wishes to consolidate care and transfer his oncology care from [place] to the [facility]. Dr Lindi Adie of medical oncology consulted. Also expressed interest in palliative care, consultation placed as well.   Assessment/Plan: 1. Systemic inflammatory response syndrome.  -Patient presenting with a temperature of 103 with development of acute kidney injury. Workup thus far has not revealed an obvious source of infection. Chest x-ray, urinalysis, negative. Labs showing a white count of 9500. Blood cultures and urine cultures are currently pending. -Blood cultures remain negative -Will continue to observe off of  antimicrobials  2.  Acute kidney injury -AM labs showing an upward trend in creatinine to 1.84 from 1.32 which could secondary to ATN, he was hypotensive on 10/13/2013 that responded to IV fluid boluses -Creatinine improved to 1.24 from 1.84 on 10/15/2014.  -Will give 20 mg of IV lasix as he appears volume overloaded on exam.    3.  Hyponatremia.  -I secondary to dehydration, improved with administration of IV fluids  4.  Hypertension. -Has SBP's in the 120's to 130's this morning, will continue to hold his antihypertensives for now -Consider restarting the next 24 hours if blood pressures trend up.   5. Leukopenia -Likely secondary to taxol received at [facility] on October 11, 2014 -Will check a CBC with diff in am -Heme/Onc consulted  6.  Metastatic bladder cancer -Patient currently receiving his care at [facility], recently started on Taxol therapy. -Case discussed with his medical oncologist Dr. Aline Brochure at [facility]. Dr. Aline Brochure supports a transition to comfort care and patient's desire to seek a palliative care consultation. -Family expressing wishes to consolidate care with [facility]. Dr Lindi Adie of medical oncology consulted.   Code Status: Full code Family Communication: I spoke to his wife and son who were present at bedside Disposition Plan: Continue holding IV antimicrobials, Pending PT/OT and Heme/Onc consultation   Consultants:  Infectious disease  Telephone consultation made to his oncologist Dr. Aline Brochure at [facility]   Antibiotics:  Vancomycin  Zosyn  HPI/Subjective: Patient out of bed to chair, reports bilateral lower extremity swelling and pain  Objective: Filed Vitals:   10/17/14 0914  BP: 126/62  Pulse: 86  Temp: 98.4 F (36.9 C)  Resp: 16    Intake/Output Summary (Last 24 hours) at 10/17/14 1217 Last data filed  at 10/17/14 1100  Gross per 24 hour  Intake    480 ml  Output    3301 ml  Net  -2821 ml   Filed Weights   10/13/14 2315  Weight: 114.397 kg (252 lb 3.2 oz)    Exam:   General:  No acute distress, he reports having increased swelling to his legs  Cardiovascular: Regular rate and rhythm normal S1-S2 no murmurs rubs gallops  Respiratory: Lungs were clear to auscultation bilaterally I do not auscultate wheezing, rhonchi, rales, crackles  Abdomen: Soft, nontender, nondistended, patient having benign abdominal exam  Musculoskeletal: 1-2+ bilateral lower extremity edema, increased from yesterday's exam  Data Reviewed: Basic Metabolic Panel:  Recent Labs Lab 10/14/14 0500 10/15/14 0510 10/15/14 1400 10/16/14 0813 10/17/14 0645  NA 132* 134* 133* 135 133*  K 3.7 3.7 4.1 3.8 4.0  CL 103 105 105 109 106  CO2 22 22 21 20 21   GLUCOSE 118* 116* 144* 129* 120*  BUN 25* 34* 29* 24* 20  CREATININE 1.32 1.84* 1.66* 1.51* 1.24  CALCIUM 7.8* 8.0* 8.1* 8.0* 8.3*  MG 1.5  --   --   --   --   PHOS 3.5  --   --   --   --    Liver Function Tests:  Recent Labs Lab 10/14/14 0500  AST 26  ALT 22  ALKPHOS 44  BILITOT 1.5*  PROT 6.5  ALBUMIN 3.1*   No results for input(s): LIPASE, AMYLASE in the last 168 hours. No results for input(s): AMMONIA in the last 168 hours. CBC:  Recent Labs Lab 10/13/14 1803 10/14/14 0500 10/15/14 0510 10/16/14 0620 10/17/14 0645  WBC 7.2 9.5 6.8 3.4* 1.5*  NEUTROABS 6.3  --   --   --   --   HGB 10.9* 11.1* 9.8* 9.3* 10.0*  HCT 34.3* 34.6* 29.7* 28.3* 30.7*  MCV 97.4 97.2 96.7 96.6 96.2  PLT 150 123* 94* 82* 109*   Cardiac Enzymes: No results for input(s): CKTOTAL, CKMB, CKMBINDEX, TROPONINI in the last 168 hours. BNP (last 3 results)  Recent Labs  08/29/14 0135  BNP 117.9*    ProBNP (last 3 results) No results for input(s): PROBNP in the last 8760 hours.  CBG: No results for input(s): GLUCAP in the last 168 hours.  Recent Results (from the past 240 hour(s))  Culture, blood (routine x 2)      Status: None (Preliminary result)   Collection Time: 10/13/14  6:03 PM  Result Value Ref Range Status   Specimen Description BLOOD RIGHT ARM  Final   Special Requests BOTTLES DRAWN AEROBIC AND ANAEROBIC 5CC  Final   Culture   Final           BLOOD CULTURE RECEIVED NO GROWTH TO DATE CULTURE WILL BE HELD FOR 5 DAYS BEFORE ISSUING A FINAL NEGATIVE REPORT Performed at [facility]    Report Status PENDING  Incomplete  Culture, blood (routine x 2)     Status: None (Preliminary result)   Collection Time: 10/13/14  6:23 PM  Result Value Ref Range Status   Specimen Description BLOOD  Final   Special Requests BOTTLES DRAWN AEROBIC ONLY  Final   Culture   Final           BLOOD CULTURE RECEIVED NO GROWTH TO DATE CULTURE WILL BE HELD FOR 5 DAYS BEFORE ISSUING A FINAL NEGATIVE REPORT Performed at [facility]    Report Status PENDING  Incomplete  Urine culture  Status: None   Collection Time: 10/13/14  8:53 PM  Result Value Ref Range Status   Specimen Description URINE, CLEAN CATCH  Final   Special Requests NONE  Final   Colony Count NO GROWTH Performed at [facility]   Final   Culture NO GROWTH Performed at [facility]   Final   Report Status 10/15/2014 FINAL  Final  Culture, expectorated sputum-assessment     Status: None   Collection Time: 10/14/14  1:54 PM  Result Value Ref Range Status   Specimen Description SPUTUM  Final   Special Requests Immunocompromised  Final   Sputum evaluation   Final    THIS SPECIMEN IS ACCEPTABLE. RESPIRATORY CULTURE REPORT TO FOLLOW.   Report Status 10/14/2014 FINAL  Final  Culture, respiratory (NON-Expectorated)     Status: None   Collection Time: 10/14/14  1:54 PM  Result Value Ref Range Status   Specimen Description SPUTUM  Final   Special Requests NONE  Final   Gram Stain   Final    RARE WBC PRESENT, PREDOMINANTLY PMN RARE SQUAMOUS EPITHELIAL CELLS PRESENT RARE YEAST RARE GRAM POSITIVE COCCI IN PAIRS RARE  GRAM NEGATIVE RODS    Culture   Final    NORMAL OROPHARYNGEAL FLORA Performed at [facility]    Report Status 10/17/2014 FINAL  Final     Studies: Dg Chest 2 View  10/16/2014   CLINICAL DATA:  Fever 103. Acute kidney injury. No obvious source of infection.  EXAM: CHEST  2 VIEW  COMPARISON:  10/13/2014  FINDINGS: Right-sided PowerPort tip overlies the superior vena cava. Heart size is upper normal. No focal consolidations. There are small bilateral pleural effusions. Degenerative changes are seen in the mid thoracic spine.  IMPRESSION: Small bilateral pleural effusions.  No consolidations.   Electronically Signed   By: Nolon Nations M.D.   On: 10/16/2014 17:12    Scheduled Meds: . aspirin EC  81 mg Oral Daily  . atorvastatin  40 mg Oral q1800  . guaiFENesin  600 mg Oral BID  . heparin subcutaneous  5,000 Units Subcutaneous 3 times per day  . predniSONE  10 mg Oral Q breakfast  . sertraline  150 mg Oral Daily  . venlafaxine XR  150 mg Oral Daily   Continuous Infusions:    Active Problems:   Coronary atherosclerosis of native coronary artery   Obstructive sleep apnea   Metastasis from bladder cancer   SIRS (systemic inflammatory response syndrome)    Time spent: 35 minutes    Kelvin Cellar  [facility] Pager [phone]. If 7PM-7AM, please contact night-coverage at [email or web address], password [email or web address] 10/17/2014, 12:17 PM  LOS: 4 days

## 2014-10-17 NOTE — Consult Note (Signed)
Patient James [name]      DOB: 1935/10/12      SJG:[id number]     Consult Note from the Palliative Medicine Team at [place] Requested by: Dr Coralyn Pear     PCP: Dione Plover, MD Reason for Consultation:  Clarification of [place] and options    Phone Number:[phone]  Assessment of patients Current state:  Continued physical and functional decline 2/2 to multiple co-morbidities; specific to metastatic bladder cancer/and its treatments and  CAD.  Patient and family are faced with treatment option decisions, advanced directive decisions and anticipatory care needs.   Consult is for review of medical treatment options, clarification of goals of care and end of life issues, disposition and options, and symptom recommendation.  This NP Wadie Lessen reviewed medical records, received report from team, assessed the patient and then meet at the patient's bedside along with his son/ Jarrid and wife Winifred Legerton to discuss diagnosis, prognosis, GOC, EOL wishes disposition and options.  A detailed discussion was had today regarding advanced directives.  Concepts specific to code status, artifical feeding and hydration, continued IV antibiotics and rehospitalization was had.  The difference between a aggressive medical intervention path  and a palliative comfort care path for this patient at this time was had.  Values and goals of care important to patient and family were attempted to be elicited.  Concept of Hospice and Palliative Care were discussed  Natural trajectory and expectations at EOL were discussed.  Questions and concerns addressed.  Hard Choices booklet left for review. Family encouraged to call with questions or concerns.  PMT will continue to support holistically.   Goals of Care: 1.  Code Status: DNR/DNI   2. Scope of Treatment: At this time patient is open to continue current medical treatment plan, treat the treatable and watchful waiting, hoping for return to  baseline and improvement  3. Disposition: Home when medically stable,  hospice vs home health, to be decided   4. Symptom Management:    Pain:        Initiate Morphine ER 15 mg po every 12 hrs                       Vicodin 5-325 mg po one tablet every 4 hrs prn Evaluate in am and make adjustments as indicated.   Bowel Regimen: Senna-s one tablet po qhs prn  5. Psychosocial: Emotional support offered to patient and his family, it is difficult for all facing the concept of mortality and limitations of medical intervetnions  6. Spiritual:  Strong/complex religious relationship.  Declines hospital chaplain consult at this time.      Patient Documents Completed or Given: Document Given Completed  Advanced Directives Pkt    MOST X   DNR X   Gone from My Sight    Hard Choices X     Brief HPI: 79 yo male with PMH of metastatic bladder cancer/care received at [facility], most recent palliative chemo/Taxol/ 10-11-14 admitted on 10-13-14 with fever, weakness, leukopenia  Workup for fever of unknown origin, empiric antibiotics, negative lumbar puncture. Oncology consulted   ROS:  Rash, weakness, fatigue   PMH: . Past Medical History  Diagnosis Date  . Atrial flutter 05/2009    STATUS POST CARDIOVERSION;  s/p RFCA in 2012 (Dr. Caryl Comes)  . Depression   . History of immunotherapy 2014    "had many times before and it worked; had a severe reaction  01/2013; they coded me, I had sepsis; almost died" (2013-08-25)  . Bladder cancer     'started chemo 07/2013 " (08-25-2013)  . OSA on CPAP   . Pneumonia   . CAD (coronary artery disease)     a. NSTEMI 07/2013:  LHC (08/03/13): mLAD 95% assoc w/ thrombus, dLAD 50%, pRCA 80%, mRCA 50%, normal LVF. => PCI:  Rebel (4 x 12 mm) BMS to the mLAD and Rebel (4 x 15 mm) BMS to the pRCA.  . Osteoarthritis   . Dyslipidemia     Lipitor started at time of NSTEMI 07/2013     PSH: Past Surgical History  Procedure Laterality Date  . Carpal  tunnel release Bilateral ~ 2012  . Atrial flutter ablation  2012  . Cardioversion  05/2009  . Transurethral resection of bladder tumor      "several" (08-25-13)  . Cystoscopy  4-12/2012    "biopsies; removal of tissue" (08-25-2013)  . Renal mass excision  03/2013    "I've had 2 of these; percutaneous" (08-25-13)  . Lymph node biopsy  06/2013    "in the area of the kidney" (08-25-2013)  . Tonsillectomy and adenoidectomy  1940's  . Left heart catheterization with coronary angiogram N/A 08/03/2013    Procedure: LEFT HEART CATHETERIZATION WITH CORONARY ANGIOGRAM;  Surgeon: Ramond Dial, MD;  Location[facility] C[facility] LAB;  Service: Cardiovascular;  Laterality: N/A;  . Percutaneous coronary stent intervention (pci-s)  08/03/2013    Procedure: PERCUTANEOUS CORONARY STENT INTERVENTION (PCI-S);  Surgeon: Ramond Dial, MD;  Location: [facility] CATH LAB;  Service: Cardiovascular;;   I have reviewed the [place] and SH and  If appropriate update it with new information. Allergies  Allergen Reactions  . Morphine Nausea Only    OK if given slowly.  . Other Other (See Comments)    Patient states when he takes flomax and protonix together makes him break out in a rash  . Adhesive [Tape] Itching and Rash    Blisters, tears skin  . Bupropion Hives and Rash  . Flomax [Tamsulosin Hcl] Rash    When taken with protonix only patient can take this alone  . Pantoprazole Sodium Rash    When taken with flomax only patient can take this alone  . Tamsulosin Rash    When taken with protonix only patient can take this alone   Scheduled Meds: . aspirin EC  81 mg Oral Daily  . atorvastatin  40 mg Oral q1800  . guaiFENesin  600 mg Oral BID  . heparin subcutaneous  5,000 Units Subcutaneous 3 times per day  . predniSONE  10 mg Oral Q breakfast  . sertraline  150 mg Oral Daily  . venlafaxine XR  150 mg Oral Daily   Continuous Infusions:  PRN Meds:.acetaminophen **OR** acetaminophen, HYDROcodone-acetaminophen,  LORazepam, ondansetron **OR** ondansetron (ZOFRAN) IV, traMADol    BP 126/62 mmHg  Pulse 86  Temp(Src) 98.4 F (36.9 C) (Oral)  Resp 16  Ht 5\' 9"  (1.753 m)  Wt 114.397 kg (252 lb 3.2 oz)  BMI 37.23 kg/m2  SpO2 95%   PPS:30 %    Intake/Output Summary (Last 24 hours) at 10/17/14 1106 Last data filed at 10/17/14 1100  Gross per 24 hour  Intake    480 ml  Output   3301 ml  Net  -2821 ml    Physical Exam:  General: Chronically ill appearing, weak HEENT: blistered lips, dry buccal mmbranes Chest:   CTA CVS: RRR Abdomen: soft NT +  BS Ext: BLE trace edema Skin:  Noted red, raised rash face/arms and Neuro: moves all four extremities, ambulated with PT  Labs: CBC    Component Value Date/Time   WBC 1.5* 10/17/2014 0645   RBC 3.19* 10/17/2014 0645   HGB 10.0* 10/17/2014 0645   HCT 30.7* 10/17/2014 0645   PLT 109* 10/17/2014 0645   MCV 96.2 10/17/2014 0645   MCH 31.3 10/17/2014 0645   MCHC 32.6 10/17/2014 0645   RDW 16.8* 10/17/2014 0645   LYMPHSABS 0.6* 10/13/2014 1803   MONOABS 0.2 10/13/2014 1803   EOSABS 0.0 10/13/2014 1803   BASOSABS 0.0 10/13/2014 1803    BMET    Component Value Date/Time   NA 133* 10/17/2014 0645   K 4.0 10/17/2014 0645   CL 106 10/17/2014 0645   CO2 21 10/17/2014 0645   GLUCOSE 120* 10/17/2014 0645   BUN 20 10/17/2014 0645   CREATININE 1.24 10/17/2014 0645   CALCIUM 8.3* 10/17/2014 0645   GFRNONAA 54* 10/17/2014 0645   GFRAA 63* 10/17/2014 0645    CMP     Component Value Date/Time   NA 133* 10/17/2014 0645   K 4.0 10/17/2014 0645   CL 106 10/17/2014 0645   CO2 21 10/17/2014 0645   GLUCOSE 120* 10/17/2014 0645   BUN 20 10/17/2014 0645   CREATININE 1.24 10/17/2014 0645   CALCIUM 8.3* 10/17/2014 0645   PROT 6.5 10/14/2014 0500   ALBUMIN 3.1* 10/14/2014 0500   AST 26 10/14/2014 0500   ALT 22 10/14/2014 0500   ALKPHOS 44 10/14/2014 0500   BILITOT 1.5* 10/14/2014 0500   GFRNONAA 54* 10/17/2014 0645   GFRAA 63* 10/17/2014  0645     Time In Time Out Total Time Spent with Patient Total Overall Time  1000 1130 80 min 90 min    Greater than 50%  of this time was spent counseling and coordinating care related to the above assessment and plan.   Wadie Lessen NP  Palliative Medicine Team Team Phone # [phone] Pager [phone]  Discussed with Dr Coralyn Pear

## 2014-10-17 NOTE — Progress Notes (Signed)
Pt has stated that he does not require any assistance for CPAP tonight, RT provided pt with sterile H20 for humidity chamber.  Pt to notify RT if any assistance is required.  RT to monitor and assess as needed.

## 2014-10-17 NOTE — Progress Notes (Signed)
Attempted to see pt x2. Pt with PT, then in medical meeting.  Will attempt back as schedule allows. Jinger Neighbors, [place] [phone]

## 2014-10-17 NOTE — Consult Note (Signed)
[place] CONSULT NOTE  Patient Care Team: Dione Plover, MD as PCP - General (Internal Medicine)  CHIEF COMPLAINTS/PURPOSE OF CONSULTATION:  Metastatic bladder cancer  HISTORY OF PRESENTING ILLNESS:  James [name] 79 y.o. male with a history of metastatic bladder cancer. He was originally diagnosed in 1997 with the superficial urothelial cancer by Dr. Lawerance Bach and he underwent transurethral resection of bladder tumor followed by intravesical BCG treatments all the way until 2002. Subsequently he had done well up until April 2014 when there was evidence of left kidney showing papillary urothelial carcinoma that was low-grade without any evidence of invasive component. He underwent further evaluation 04/05/2013 that showed recurrent urothelial cancer and he was treated with percutaneous kidney resection followed by mitomycin-C. Subsequently in October 2014 he had a CT that showed enlarged para-aortic lymph nodes in December 20 he received gemcitabine and cisplatin followed by gemcitabine and carboplatin with stabilization of disease after 5 cycles. June 2015 he had a CT showing worsening lymphadenopathy and was referred to [facility] where he underwent clinical trial with Keytruda + ACP 196 which led to profound skin reaction and it was discontinued.  Recently he received third line chemotherapy with Taxol once every 3 weeks. Subsequently he was hospitalized with fevers and workup was negative for any source of infection. His fevers have resolved. Family wanted to meet his to discuss treatment plan and to transition his care to [facility].  I reviewed her records extensively and collaborated the history with the patient.  SUMMARY OF ONCOLOGIC HISTORY: based on documentation of Dr. Aline Brochure at [facility] Metastatic urothelial carcinoma - 1996-2002: History of recurrent superficial urothelial carcinoma. Followed by Dr. Lawerance Bach. - 12/01/2012: L renal pelvis papillary urothelial  carcinoma, low grade w/o evidence of invasive component but biopsy superficial in nature. - 04/05/2013: L renal collecting duct fragment low grade urothelial papillary carcinoma (muscularis present and uninvolved). - treated with mitomycin C (after above 2 percutaneous excisions). Had required temporary L PCN tube. - 06/06/2013: CT a/p enlarging paraaortic LNs (2.2 x 1.7 cm- > 3.8 x 2.6 cm), possible thrombus R internal iliac artery, urothelial thickening L upper pole stable - 06/20/2013: FNA RP LN - metastatic urothelial Ca - 07/08/2013 CT chest multiple mediastinal LN - 07/14/2013: Gemcitabine/cisplatin x 1 cycle. Had MI after 1st cycle, require 2 stents. - 08/12/2013- switched to gemcitabine/carboplatin 2/2 to renal concern - 09/01/2013: CT c/a/p Decreased mediastinal and RP adenopathy, stable liver lesions (thought to be cysts), stable subcarinal lesion - 11/01/2013: similar/slightly decreased RP LNs, similar mediastinal LN. - 11/29/2013: completed total 5 cycles of platinum/gemcitabine (had several holds 2/2 to anemia, best overall response PR), then observation - 01/26/2014: CT c/a/p enlarging posterior mediastinal and RP adenopathy (peri aortic 2.1 x 2 cm -> 2.4 x 2.4 cm; 8 mm -> 14 mm) and abnormal enhancement in L upper pole - 03/16/2014: Progressive mediastinal (LNs in 1-2 cm range, index 13->14 mm change) / increased RP adenopathy (aortocaval 13 mm-> 64mm, L para-aortic 11 mm-> 16 mm, 15 mm-> 17 mm) - 04/12/14 Cr 1.22 - 65/78/46 [id number] [place] Bladder randomized to ACP-196 + pembrolizumab - 06/28/14 C2D1 pembrolizumab infusion held 2/2 to worsening skin lesions. Continue with ACP-196 - 07/05/14 C2D1 infusion pembro - 07/19/14 Pembro + ACP-196 due to grade 3 maculo-papular rash and grade 2 transaminitis Feb 2016: Taxol x 1: severe pain in extremities and fever  MEDICAL HISTORY:  Past Medical History  Diagnosis Date  . Atrial flutter 05/2009    STATUS  POST CARDIOVERSION;  s/p RFCA in 2012 (Dr.  Caryl Comes)  . Depression   . History of immunotherapy 2014    "had many times before and it worked; had a severe reaction 24-Feb-2013; they coded me, I had sepsis; almost died" (08/19/13)  . Bladder cancer     'started chemo 07/2013 " (19-Aug-2013)  . OSA on CPAP   . Pneumonia   . CAD (coronary artery disease)     a. NSTEMI 07/2013:  LHC (08/03/13): mLAD 95% assoc w/ thrombus, dLAD 50%, pRCA 80%, mRCA 50%, normal LVF. => PCI:  Rebel (4 x 12 mm) BMS to the mLAD and Rebel (4 x 15 mm) BMS to the pRCA.  . Osteoarthritis   . Dyslipidemia     Lipitor started at time of NSTEMI 07/2013    SURGICAL HISTORY: Past Surgical History  Procedure Laterality Date  . Carpal tunnel release Bilateral ~ 2012  . Atrial flutter ablation  2012  . Cardioversion  05/2009  . Transurethral resection of bladder tumor      "several" (August 19, 2013)  . Cystoscopy  4-12/2012    "biopsies; removal of tissue" (08/19/13)  . Renal mass excision  03/2013    "I've had 2 of these; percutaneous" (August 19, 2013)  . Lymph node biopsy  06/2013    "in the area of the kidney" (2013-08-19)  . Tonsillectomy and adenoidectomy  1940's  . Left heart catheterization with coronary angiogram N/A 08/03/2013    Procedure: LEFT HEART CATHETERIZATION WITH CORONARY ANGIOGRAM;  Surgeon: Ramond Dial, MD;  Location: [facility] CATH LAB;  Service: Cardiovascular;  Laterality: N/A;  . Percutaneous coronary stent intervention (pci-s)  08/03/2013    Procedure: PERCUTANEOUS CORONARY STENT INTERVENTION (PCI-S);  Surgeon: Ramond Dial, MD;  Location: [facility] CATH LAB;  Service: Cardiovascular;;    SOCIAL HISTORY: History   Social History  . Marital Status: Married    Spouse Name: N/A  . Number of Children: N/A  . Years of Education: N/A   Occupational History  . Not on file.   Social History Main Topics  . Smoking status: Former Smoker -- 1.50 packs/day for 15 years    Types: Cigarettes  . Smokeless tobacco: Never Used     Comment: 08/19/13  "quit smoking in 1984"  . Alcohol Use: No     Comment: 10/13/2014 "hx of abuse; clean since 05/29/1981"  . Drug Use: No  . Sexual Activity: Yes   Other Topics Concern  . Not on file   Social History Narrative    The patient is a retired Administrator.  He used to     smoke but quit in 1983.  He does not drink alcohol.     FAMILY HISTORY: Family History  Problem Relation Age of Onset  . Heart failure Father   . Stroke Father   . Kidney failure Mother   . Coronary artery disease      ALLERGIES:  is allergic to morphine; other; adhesive; bupropion; flomax; pantoprazole sodium; and tamsulosin.  MEDICATIONS:  Current Facility-Administered Medications  Medication Dose Route Frequency Provider Last Rate Last Dose  . acetaminophen (TYLENOL) tablet 650 mg  650 mg Oral Q6H PRN Toy Baker, MD   650 mg at 10/14/14 0114   Or  . acetaminophen (TYLENOL) suppository 650 mg  650 mg Rectal Q6H PRN Toy Baker, MD      . aspirin EC tablet 81 mg  81 mg Oral Daily Toy Baker, MD   81 mg at 10/17/14 0937  .  atorvastatin (LIPITOR) tablet 40 mg  40 mg Oral q1800 Toy Baker, MD   40 mg at 10/16/14 1749  . guaiFENesin (MUCINEX) 12 hr tablet 600 mg  600 mg Oral BID Toy Baker, MD   600 mg at 10/17/14 0937  . heparin injection 5,000 Units  5,000 Units Subcutaneous 3 times per day Kelvin Cellar, MD   5,000 Units at 10/17/14 1415  . HYDROcodone-acetaminophen (NORCO/VICODIN) 5-325 MG per tablet 1-2 tablet  1-2 tablet Oral Q4H PRN Toy Baker, MD   1 tablet at 10/17/14 0937  . lidocaine-prilocaine (EMLA) cream   Topical PRN Kelvin Cellar, MD      . LORazepam (ATIVAN) tablet 1 mg  1 mg Oral Q12H PRN Toy Baker, MD      . morphine (MS CONTIN) 12 hr tablet 15 mg  15 mg Oral Q12H Knox Royalty, NP   15 mg at 10/17/14 1415  . ondansetron (ZOFRAN) tablet 4 mg  4 mg Oral Q6H PRN Toy Baker, MD       Or  . ondansetron (ZOFRAN) injection 4 mg  4 mg  Intravenous Q6H PRN Toy Baker, MD      . predniSONE (DELTASONE) tablet 10 mg  10 mg Oral Q breakfast Toy Baker, MD   10 mg at 10/17/14 0735  . sertraline (ZOLOFT) tablet 150 mg  150 mg Oral Daily Toy Baker, MD   150 mg at 10/17/14 0937  . traMADol (ULTRAM) tablet 50 mg  50 mg Oral Q6H PRN Toy Baker, MD   50 mg at 10/14/14 1632  . venlafaxine XR (EFFEXOR-XR) 24 hr capsule 150 mg  150 mg Oral Daily Toy Baker, MD   150 mg at 10/17/14 9629    REVIEW OF SYSTEMS:   Constitutional: Denies fevers, chills or abnormal night sweats Eyes: Denies blurriness of vision, double vision or watery eyes Ears, nose, mouth, throat, and face: Denies mucositis or sore throat Respiratory: Denies cough, dyspnea or wheezes Cardiovascular: Denies palpitation, chest discomfort or lower extremity swelling Gastrointestinal:  Denies nausea, heartburn or change in bowel habits SkinMaculopapular skin rash Neurological: Generalized weaknessal/Psych: Mood is stable, no new changes  Pain is much better controlled on MS Contin  All other systems were reviewed with the patient and are negative.  PHYSICAL EXAMINATION: ECOG PERFORMANCE STATUS: 3 - Symptomatic, >50% confined to bed  Filed Vitals:   10/17/14 1523  BP: 126/62  Pulse: 77  Temp: 98.1 F (36.7 C)  Resp: 16   Filed Weights   10/13/14 2315  Weight: 252 lb 3.2 oz (114.397 kg)    GENERAL:alert, no distress and comfortable SKIN: Macular rash diffusely throughout the body EYES: normal, conjunctiva are pink and non-injected, sclera clear OROPHARYNX:no exudate, no erythema and lips, buccal mucosa, and tongue normal  NECK: supple, thyroid normal size, non-tender, without nodularity LUNGS: clear to auscultation and percussion with normal breathing effort HEART: regular rate & rhythm and no murmurs and no lower extremity edema ABDOMEN:abdomen soft, non-tender and normal bowel sounds Musculoskeletal:no cyanosis of digits  and no clubbing  PSYCH: alert & oriented x 3 with fluent speech NEURO: no focal motor/sensory deficits, lower extremity weakness   LABORATORY DATA:  I have reviewed the data as listed Lab Results  Component Value Date   WBC 1.5* 10/17/2014   HGB 10.0* 10/17/2014   HCT 30.7* 10/17/2014   MCV 96.2 10/17/2014   PLT 109* 10/17/2014   Lab Results  Component Value Date   NA 133* 10/17/2014   K 4.0 10/17/2014  CL 106 10/17/2014   CO2 21 10/17/2014    RADIOGRAPHIC STUDIES: I have personally reviewed the radiological reports and agreed with the findings in the report.  CT scan 09/21/2014 showed multiple mediastinal lymph nodes increased in size, pretracheal lymph node 1.1 cm previously 6 mm another pretracheal node measured 1.6 cm previously 1 cm, subcarinal node 1.3 cm redemonstration of multiple hypodensities within the liver there are cysts, left adrenal gland 2.4 x 1.9 cm this previously 1.7 x 1.4 cm redemonstration of extensive retroperitoneal fat stranding and lymphadenopathy appears stable, para-aortic node measures 2.5 x 2.1 cm previously 1.9 x 2.1 cm additional lymph node 2 x 3 cm previously 1.5 x 2.8 cm common iliac node 1.37 hours previously 1 cm   ASSESSMENT AND PLAN:  1. Metastatic bladder carcinoma with lymph node metastases recently received third line chemotherapy with Taxol. he had profound pain in his extremities also accompanied by fevers. He was hospitalized an extensive workup did not reveal any source of infection.  We spent over an hour discussing his prior treatment as well as his personal wishes regarding his care. Patient expressed his wishes to spend more time with his family as quality of life is more important to him. He would be open to receiving additional chemotherapy if it would not impair his quality of life. I discussed with him 3 options  A. change of treatment from Taxol to Abraxane which could be better tolerated with must less pain in his extremities as  well as neuropathy risk. He could receive Abraxane phase 1 and 8 every 3 weeks. However the likelihood of response to third line chemotherapy is extremely low. Patient understands the risks and benefits.  B. second option would be to focus on quality of life and not do systemic chemotherapy. He could be considered then for hospice and palliative care.   C. third option was to not do any systemic treatment for a couple of months and then restage him with a CT chest abdomen pelvis and the side effect that point he would like to go on chemotherapy versus hospice versus continued observation.   Decision: After much discussion patient and family have elected to go on option #3. I will arrange a follow-up with him in 2 months with a CT chest abdomen and pelvis in follow-up. I discussed with the patient that he should use this time to spend more time with his grandchildren and do all the things that he plans to do especially going fishing and other fun activities that he is always wanted to do with them. If he does additional chemotherapy immediately, I do not believe he will be able to enjoy those things.   2. Neutropenia without fever: I discussed with the patient that he does not need to be in the hospital just because he has neutropenia. In fact the safest place for him to be would be his home.   3. I agree with physical therapy and occupational therapy to help him get back to his feet.  Thank you very much for your consultation  All questions were answered. The patient knows to call the clinic with any problems, questions or concerns.    Rulon Eisenmenger, MD 6:41 PM

## 2014-10-17 NOTE — Evaluation (Signed)
Physical Therapy Evaluation Patient Details Name: James [name] MRN: [id number] DOB: 1936-07-02 Today's Date: 10/17/2014   History of Present Illness  79 y.o. male with history of CAD status post stenting, atrial flutter status post ablation, metastatic bladder carcinoma, OSA on CPap, dyslipidemia admitted for SIRS, fever, and acute kidney injury  Clinical Impression  Pt admitted with above diagnosis. Pt currently with functional limitations due to the deficits listed below (see PT Problem List).  Pt will benefit from skilled PT to increase their independence and safety with mobility to allow discharge to the venue listed below.  Pt mobilizing slowly and able to tolerate short distance ambulation.  Pt meeting with palliative care just after session and to discuss d/c plans.  Pt would benefit from ST-SNF prior to home if pt and family agreeable otherwise would recommend HHPT and assist for mobility.     Follow Up Recommendations SNF;Supervision for mobility/OOB    Equipment Recommendations  None recommended by PT    Recommendations for Other Services       Precautions / Restrictions Precautions Precautions: Fall      Mobility  Bed Mobility Overal bed mobility: Modified Independent                Transfers Overall transfer level: Needs assistance Equipment used: Rolling walker (2 wheeled) Transfers: Sit to/from Stand Sit to Stand: Min assist         General transfer comment: assist to steady, pt reports hx of orthostatic hypotension over this past year and typically stands and waits prior to ambulating  Ambulation/Gait Ambulation/Gait assistance: Min guard Ambulation Distance (Feet): 120 Feet Assistive device: Rolling walker (2 wheeled) Gait Pattern/deviations: Step-through pattern;Trunk flexed     General Gait Details: slow but steady gait with RW, pt reports fatigue  Stairs            Wheelchair Mobility    Modified Rankin (Stroke Patients Only)        Balance                                             Pertinent Vitals/Pain Pain Assessment: 0-10 Pain Score: 3  Pain Intervention(s): Limited activity within patient's tolerance;Monitored during session;Repositioned (RN in room and asked about pain)    Home Living Family/patient expects to be discharged to:: Private residence Living Arrangements: Spouse/significant other Available Help at Discharge: Family;Available 24 hours/day Type of Home: House Home Access: Stairs to enter   [employer] of Steps: "a few" Home Layout: Two level;Bed/bath upstairs Home Equipment: Cane - single point;Walker - 4 wheels (rollator)      Prior Function Level of Independence: Independent with assistive device(s)         Comments: has been using "walking sticks" recently when not feeling well, states he has abdundant assist available at home     Hand Dominance        Extremity/Trunk Assessment               Lower Extremity Assessment: Generalized weakness         Communication   Communication: No difficulties  Cognition Arousal/Alertness: Awake/alert Behavior During Therapy: WFL for tasks assessed/performed Overall Cognitive Status: Within Functional Limits for tasks assessed                      General Comments      Exercises  Assessment/Plan    PT Assessment Patient needs continued PT services  PT Diagnosis Difficulty walking;Generalized weakness   PT Problem List Decreased strength;Decreased activity tolerance;Decreased mobility;Decreased knowledge of use of DME  PT Treatment Interventions DME instruction;Gait training;Functional mobility training;Patient/family education;Therapeutic activities;Therapeutic exercise;Stair training   PT Goals (Current goals can be found in the Care Plan section) Acute Rehab PT Goals PT Goal Formulation: With patient/family Time For Goal Achievement: 10/24/14 Potential to Achieve Goals:  Good    Frequency Min 3X/week   Barriers to discharge        Co-evaluation               End of Session   Activity Tolerance: Patient tolerated treatment well Patient left: in bed;with call bell/phone within reach;with family/visitor present (palliative NP) Nurse Communication: Mobility status         Time: 7195-9747 PT Time Calculation (min) (ACUTE ONLY): 20 min   Charges:   PT Evaluation $Initial PT Evaluation Tier I: 1 Procedure     PT G Codes:        Sinthia Karabin,KATHrine E 10/17/2014, 1:36 PM Carmelia Bake, PT, DPT 10/17/2014 Pager: [phone]

## 2014-10-17 NOTE — Progress Notes (Signed)
INFECTIOUS DISEASE PROGRESS NOTE  ID: James [name] is a 79 y.o. male with  Active Problems:   Coronary atherosclerosis of native coronary artery   Obstructive sleep apnea   Metastasis from bladder cancer   SIRS (systemic inflammatory response syndrome)  Subjective: C/o diffuse pain, bone pain. headache  Abtx:  Anti-infectives    Start     Dose/Rate Route Frequency Ordered Stop   10/15/14 2245  vancomycin (VANCOCIN) IVPB 750 mg/150 ml premix  Status:  Discontinued     750 mg 150 mL/hr over 60 Minutes Intravenous 2 times daily 10/15/14 2243 10/16/14 1116   10/14/14 1000  vancomycin (VANCOCIN) IVPB 750 mg/150 ml premix  Status:  Discontinued     750 mg 150 mL/hr over 60 Minutes Intravenous Every 12 hours 10/13/14 2024 10/15/14 1151   10/14/14 0400  piperacillin-tazobactam (ZOSYN) IVPB 3.375 g  Status:  Discontinued     3.375 g 12.5 mL/hr over 240 Minutes Intravenous Every 8 hours 10/13/14 2023 10/16/14 1116   10/13/14 2345  oseltamivir (TAMIFLU) capsule 75 mg  Status:  Discontinued     75 mg Oral 2 times daily 10/13/14 2332 10/15/14 1329   10/13/14 1915  piperacillin-tazobactam (ZOSYN) IVPB 3.375 g     3.375 g 100 mL/hr over 30 Minutes Intravenous  Once 10/13/14 1906 10/13/14 2045   10/13/14 1915  vancomycin (VANCOCIN) IVPB 1000 mg/200 mL premix  Status:  Discontinued     1,000 mg 200 mL/hr over 60 Minutes Intravenous  Once 10/13/14 1906 10/13/14 1914   10/13/14 1915  vancomycin (VANCOCIN) 2,000 mg in sodium chloride 0.9 % 500 mL IVPB     2,000 mg 250 mL/hr over 120 Minutes Intravenous  Once 10/13/14 1915 10/13/14 2300      Medications:  Scheduled: . aspirin EC  81 mg Oral Daily  . atorvastatin  40 mg Oral q1800  . guaiFENesin  600 mg Oral BID  . heparin subcutaneous  5,000 Units Subcutaneous 3 times per day  . predniSONE  10 mg Oral Q breakfast  . sertraline  150 mg Oral Daily  . venlafaxine XR  150 mg Oral Daily    Objective: Vital signs in last 24 hours: Temp:   [97.8 F (36.6 C)-99.8 F (37.7 C)] 98.4 F (36.9 C) (03/08 0914) Pulse Rate:  [71-97] 86 (03/08 0914) Resp:  [16-18] 16 (03/08 0914) BP: (126-164)/(62-79) 126/62 mmHg (03/08 0914) SpO2:  [95 %-100 %] 95 % (03/08 0914)   General appearance: alert, mild distress and occas difficulty with word finding Resp: clear to auscultation bilaterally Cardio: regular rate and rhythm GI: normal findings: bowel sounds normal and soft, non-tender Skin: rash unchanged  Lab Results  Recent Labs  10/16/14 0620 10/16/14 0813 10/17/14 0645  WBC 3.4*  --  1.5*  HGB 9.3*  --  10.0*  HCT 28.3*  --  30.7*  NA  --  135 133*  K  --  3.8 4.0  CL  --  109 106  CO2  --  20 21  BUN  --  24* 20  CREATININE  --  1.51* 1.24   Liver Panel No results for input(s): PROT, ALBUMIN, AST, ALT, ALKPHOS, BILITOT, BILIDIR, IBILI in the last 72 hours. Sedimentation Rate No results for input(s): ESRSEDRATE in the last 72 hours. C-Reactive Protein No results for input(s): CRP in the last 72 hours.  Microbiology: Recent Results (from the past 240 hour(s))  Culture, blood (routine x 2)     Status: None (Preliminary  result)   Collection Time: 10/13/14  6:03 PM  Result Value Ref Range Status   Specimen Description BLOOD RIGHT ARM  Final   Special Requests BOTTLES DRAWN AEROBIC AND ANAEROBIC 5CC  Final   Culture   Final           BLOOD CULTURE RECEIVED NO GROWTH TO DATE CULTURE WILL BE HELD FOR 5 DAYS BEFORE ISSUING A FINAL NEGATIVE REPORT Performed at [facility]    Report Status PENDING  Incomplete  Culture, blood (routine x 2)     Status: None (Preliminary result)   Collection Time: 10/13/14  6:23 PM  Result Value Ref Range Status   Specimen Description BLOOD  Final   Special Requests BOTTLES DRAWN AEROBIC ONLY  Final   Culture   Final           BLOOD CULTURE RECEIVED NO GROWTH TO DATE CULTURE WILL BE HELD FOR 5 DAYS BEFORE ISSUING A FINAL NEGATIVE REPORT Performed at [facility]     Report Status PENDING  Incomplete  Urine culture     Status: None   Collection Time: 10/13/14  8:53 PM  Result Value Ref Range Status   Specimen Description URINE, CLEAN CATCH  Final   Special Requests NONE  Final   Colony Count NO GROWTH Performed at [facility]   Final   Culture NO GROWTH Performed at [facility]   Final   Report Status 10/15/2014 FINAL  Final  Culture, expectorated sputum-assessment     Status: None   Collection Time: 10/14/14  1:54 PM  Result Value Ref Range Status   Specimen Description SPUTUM  Final   Special Requests Immunocompromised  Final   Sputum evaluation   Final    THIS SPECIMEN IS ACCEPTABLE. RESPIRATORY CULTURE REPORT TO FOLLOW.   Report Status 10/14/2014 FINAL  Final  Culture, respiratory (NON-Expectorated)     Status: None   Collection Time: 10/14/14  1:54 PM  Result Value Ref Range Status   Specimen Description SPUTUM  Final   Special Requests NONE  Final   Gram Stain   Final    RARE WBC PRESENT, PREDOMINANTLY PMN RARE SQUAMOUS EPITHELIAL CELLS PRESENT RARE YEAST RARE GRAM POSITIVE COCCI IN PAIRS RARE GRAM NEGATIVE RODS    Culture   Final    NORMAL OROPHARYNGEAL FLORA Performed at [facility]    Report Status 10/17/2014 FINAL  Final    Studies/Results: Dg Chest 2 View  10/16/2014   CLINICAL DATA:  Fever 103. Acute kidney injury. No obvious source of infection.  EXAM: CHEST  2 VIEW  COMPARISON:  10/13/2014  FINDINGS: Right-sided PowerPort tip overlies the superior vena cava. Heart size is upper normal. No focal consolidations. There are small bilateral pleural effusions. Degenerative changes are seen in the mid thoracic spine.  IMPRESSION: Small bilateral pleural effusions.  No consolidations.   Electronically Signed   By: Nolon Nations M.D.   On: 10/16/2014 17:12     Assessment/Plan: ARF Sepsis- source? Metastatic bladder CA Last dose of taxol ~ 1 week ago  Total days of antibiotics: 3  vanco/zosyn (off currently)  Would check Uric acid Continue to watch off anbx Check diff in next 24h to see if he needs to be on neutropenic precautions.  Appreciate Onc and palliative care evals My appreciation to Dr Earnest Conroy as well.          Bobby Rumpf Infectious Diseases (pager) [phone] [email or web address] 10/17/2014, 10:30 AM  LOS: 4 days

## 2014-10-18 ENCOUNTER — Telehealth: Payer: Self-pay | Admitting: Hematology and Oncology

## 2014-10-18 ENCOUNTER — Other Ambulatory Visit: Payer: Self-pay

## 2014-10-18 DIAGNOSIS — Z7189 Other specified counseling: Secondary | ICD-10-CM

## 2014-10-18 DIAGNOSIS — J9600 Acute respiratory failure, unspecified whether with hypoxia or hypercapnia: Secondary | ICD-10-CM

## 2014-10-18 DIAGNOSIS — Z515 Encounter for palliative care: Secondary | ICD-10-CM

## 2014-10-18 DIAGNOSIS — R531 Weakness: Secondary | ICD-10-CM

## 2014-10-18 DIAGNOSIS — C679 Malignant neoplasm of bladder, unspecified: Secondary | ICD-10-CM

## 2014-10-18 DIAGNOSIS — C799 Secondary malignant neoplasm of unspecified site: Secondary | ICD-10-CM

## 2014-10-18 DIAGNOSIS — R52 Pain, unspecified: Secondary | ICD-10-CM | POA: Diagnosis present

## 2014-10-18 DIAGNOSIS — D709 Neutropenia, unspecified: Secondary | ICD-10-CM

## 2014-10-18 DIAGNOSIS — R1084 Generalized abdominal pain: Secondary | ICD-10-CM

## 2014-10-18 LAB — BASIC METABOLIC PANEL
Anion gap: 9 (ref 5–15)
BUN: 19 mg/dL (ref 6–23)
CO2: 23 mmol/L (ref 19–32)
Calcium: 8 mg/dL — ABNORMAL LOW (ref 8.4–10.5)
Chloride: 101 mmol/L (ref 96–112)
Creatinine, Ser: 1.15 mg/dL (ref 0.50–1.35)
GFR calc Af Amer: 69 mL/min — ABNORMAL LOW (ref >90)
GFR, EST NON AFRICAN AMERICAN: 60 mL/min — AB (ref >90)
Glucose, Bld: 100 mg/dL — ABNORMAL HIGH (ref 70–99)
Potassium: 3.7 mmol/L (ref 3.5–5.1)
Sodium: 133 mmol/L — ABNORMAL LOW (ref 135–145)

## 2014-10-18 LAB — RESPIRATORY VIRUS PANEL
ADENOVIRUS: NEGATIVE
INFLUENZA B 1: NEGATIVE
Influenza A: NEGATIVE
Metapneumovirus: NEGATIVE
Parainfluenza 1: NEGATIVE
Parainfluenza 2: NEGATIVE
Parainfluenza 3: NEGATIVE
RESPIRATORY SYNCYTIAL VIRUS A: NEGATIVE
RESPIRATORY SYNCYTIAL VIRUS B: NEGATIVE
Rhinovirus: NEGATIVE

## 2014-10-18 LAB — CBC WITH DIFFERENTIAL/PLATELET
Basophils Absolute: 0 10*3/uL (ref 0.0–0.1)
Basophils Relative: 0 % (ref 0–1)
EOS ABS: 0 10*3/uL (ref 0.0–0.7)
EOS PCT: 0 % (ref 0–5)
HCT: 29.6 % — ABNORMAL LOW (ref 39.0–52.0)
Hemoglobin: 9.7 g/dL — ABNORMAL LOW (ref 13.0–17.0)
LYMPHS PCT: 36 % (ref 12–46)
Lymphs Abs: 0.6 10*3/uL — ABNORMAL LOW (ref 0.7–4.0)
MCH: 31.5 pg (ref 26.0–34.0)
MCHC: 32.8 g/dL (ref 30.0–36.0)
MCV: 96.1 fL (ref 78.0–100.0)
Monocytes Absolute: 0.1 10*3/uL (ref 0.1–1.0)
Monocytes Relative: 5 % (ref 3–12)
Neutro Abs: 0.9 10*3/uL — ABNORMAL LOW (ref 1.7–7.7)
Neutrophils Relative %: 59 % (ref 43–77)
Platelets: 101 10*3/uL — ABNORMAL LOW (ref 150–400)
RBC: 3.08 MIL/uL — AB (ref 4.22–5.81)
RDW: 16.9 % — AB (ref 11.5–15.5)
WBC: 1.6 10*3/uL — ABNORMAL LOW (ref 4.0–10.5)

## 2014-10-18 NOTE — Progress Notes (Addendum)
Clinical Social Work Department CLINICAL SOCIAL WORK PLACEMENT NOTE 10/18/2014  Patient:  EUGENE, ISADORE  Account Number:  [id number] Admit date:  10/13/2014  Clinical Social Worker:  Maryln Manuel  Date/time:  10/18/2014 03:45 PM  Clinical Social Work is seeking post-discharge placement for this patient at the following level of care:   SKILLED NURSING   (*CSW will update this form in Epic as items are completed)   10/18/2014  Patient/family provided with [place] Department of Clinical Social Work's list of facilities offering this level of care within the geographic area requested by the patient (or if unable, by the patient's family).  10/18/2014  Patient/family informed of their freedom to choose among providers that offer the needed level of care, that participate in Medicare, Medicaid or managed care program needed by the patient, have an available bed and are willing to accept the patient.  10/18/2014  Patient/family informed of [facility]' ownership interest in [facility], as well as of the fact that they are under no obligation to receive care at this facility.  PASARR submitted to EDS on 10/18/2014 PASARR number received on 10/18/2014  FL2 transmitted to all facilities in geographic area requested by pt/family on  10/18/2014 FL2 transmitted to all facilities within larger geographic area on   Patient informed that his/her managed care company has contracts with or will negotiate with  certain facilities, including the following:     Patient/family informed of bed offers received:  10/19/2014 Patient chooses bed at [facility]pt and pt family chose for pt to return home with home health services.  Physician recommends and patient chooses bed at    Patient to be transferred to  on  - Patient to be transferred to facility by - Patient and family notified of transfer on - Name of family member notified:    The following physician request were entered in  Epic:   Additional Comments:   Alison Murray, MSW, [place] [facility] [phone]

## 2014-10-18 NOTE — Plan of Care (Signed)
Problem: Phase I Progression Outcomes Goal: OOB as tolerated unless otherwise ordered Outcome: Progressing oob with assist. Up with PT today as well

## 2014-10-18 NOTE — Care Management Note (Signed)
CARE MANAGEMENT NOTE 10/18/2014  Patient:  SMILEY, BIRR   Account Number:  [id number]  Date Initiated:  10/18/2014  Documentation initiated by:  Marney Doctor  Subjective/Objective Assessment:   79 yo admitted with SIRS. Hx of metastatic bladder ca.     Action/Plan:   From home with wife.   Anticipated DC Date:  10/19/2014   Anticipated DC Plan:  [place]  In-house referral  Clinical Social Worker      DC Planning Services  CM consult      Choice offered to / List presented to:             [place].   Status of service:  In process, will continue to follow Medicare Important Message given?   (If response is "NO", the following Medicare IM given date fields will be blank) Date Medicare IM given:   Medicare IM given by:   Date Additional Medicare IM given:   Additional Medicare IM given by:    Discharge Disposition:    Per UR Regulation:  Reviewed for med. necessity/level of care/duration of stay  If discussed at [place] of Stay Meetings, dates discussed:    Comments:  10/18/14 Marney Doctor RN,BSN,NCM [phone] Pt is active with [facility]. This CM and CSW met with pt, wife and son at bedside to discuss disposition. PT is recommending SNF. OT is recommending HHOT with 24 hr supervision.  Pt would ultimately like to go home at DC but pt and family would like SNF options as well.  Private duty care list given to wife.  Pt and family to think about options and will have decision tomorrow morning.

## 2014-10-18 NOTE — Progress Notes (Signed)
Progress Note from the Palliative Medicine Team at [facility]:   -patient tells me his pain is " much better today, and I slept better too" in response to pain medication adjustmetns  -continued conversation regarding GOC  And treatment options  -per Dr Geralyn Flash note patient is hopeful to go home with [facility] services, see how things go, return to see Dr Lindi Adie in 2 months and make further decisions at that time.  We discussed hospice benefit and they are requesting information.   Objective: Allergies  Allergen Reactions  . Morphine Nausea Only    OK if given slowly.  . Other Other (See Comments)    Patient states when he takes flomax and protonix together makes him break out in a rash  . Adhesive [Tape] Itching and Rash    Blisters, tears skin  . Bupropion Hives and Rash  . Flomax [Tamsulosin Hcl] Rash    When taken with protonix only patient can take this alone  . Pantoprazole Sodium Rash    When taken with flomax only patient can take this alone  . Tamsulosin Rash    When taken with protonix only patient can take this alone   Scheduled Meds: . aspirin EC  81 mg Oral Daily  . atorvastatin  40 mg Oral q1800  . guaiFENesin  600 mg Oral BID  . heparin subcutaneous  5,000 Units Subcutaneous 3 times per day  . morphine  15 mg Oral Q12H  . predniSONE  10 mg Oral Q breakfast  . sertraline  150 mg Oral Daily  . venlafaxine XR  150 mg Oral Daily   Continuous Infusions:  PRN Meds:.acetaminophen **OR** acetaminophen, HYDROcodone-acetaminophen, lidocaine-prilocaine, LORazepam, ondansetron **OR** ondansetron (ZOFRAN) IV, senna-docusate, traMADol  BP 132/74 mmHg  Pulse 105  Temp(Src) 98.2 F (36.8 C) (Oral)  Resp 16  Ht 5\' 9"  (1.753 m)  Wt 114.397 kg (252 lb 3.2 oz)  BMI 37.23 kg/m2  SpO2 94%   PPS:30 % at best  Pain Score:  5/10 presently  Pain Location back and legs   Intake/Output Summary (Last 24 hours) at 10/18/14 1204 Last data filed at 10/18/14 1147  Gross  per 24 hour  Intake    960 ml  Output   4000 ml  Net  -3040 ml       Physical Exam:  General: chronically ill appearing,NAD HEENT:  Lips less excoriated today, membranes moist  Chest:   CTA CVS: RRR Abdomen:soft NT +BS  Labs: CBC    Component Value Date/Time   WBC 1.6* 10/18/2014 0625   RBC 3.08* 10/18/2014 0625   HGB 9.7* 10/18/2014 0625   HCT 29.6* 10/18/2014 0625   PLT 101* 10/18/2014 0625   MCV 96.1 10/18/2014 0625   MCH 31.5 10/18/2014 0625   MCHC 32.8 10/18/2014 0625   RDW 16.9* 10/18/2014 0625   LYMPHSABS 0.6* 10/18/2014 0625   MONOABS 0.1 10/18/2014 0625   EOSABS 0.0 10/18/2014 0625   BASOSABS 0.0 10/18/2014 0625    BMET    Component Value Date/Time   NA 133* 10/18/2014 0625   K 3.7 10/18/2014 0625   CL 101 10/18/2014 0625   CO2 23 10/18/2014 0625   GLUCOSE 100* 10/18/2014 0625   BUN 19 10/18/2014 0625   CREATININE 1.15 10/18/2014 0625   CALCIUM 8.0* 10/18/2014 0625   GFRNONAA 60* 10/18/2014 0625   GFRAA 69* 10/18/2014 0625    CMP     Component Value Date/Time   NA 133* 10/18/2014 1610  K 3.7 10/18/2014 0625   CL 101 10/18/2014 0625   CO2 23 10/18/2014 0625   GLUCOSE 100* 10/18/2014 0625   BUN 19 10/18/2014 0625   CREATININE 1.15 10/18/2014 0625   CALCIUM 8.0* 10/18/2014 0625   PROT 6.5 10/14/2014 0500   ALBUMIN 3.1* 10/14/2014 0500   AST 26 10/14/2014 0500   ALT 22 10/14/2014 0500   ALKPHOS 44 10/14/2014 0500   BILITOT 1.5* 10/14/2014 0500   GFRNONAA 60* 10/18/2014 0625   GFRAA 69* 10/18/2014 0625     1  Assessment and Plan: 1. Code Status: DNR/DNI 2. Symptom Control:  Continue Morphine ER 15 mg po every 12 hrs Vicodin 5-325 mg one tablet  po every 4 hrs prn 3. Psycho/Social:  Emotional support offered to patietn and family.  Encouraged to continue conversation regarding values and GOC important to patient in order to enhance patient centered care. 4. Disposition:  Hoping for home with home health services.  Family is requesting  information on hospice services from [facility], I notified Danton Sewer RN  Patient Documents Completed or Given: Document Given Completed  Advanced Directives Pkt    MOST X   DNR    Gone from My Sight    Hard Choices X     Time In Time Out Total Time Spent with Patient Total Overall Time  1200 1225 25 min 25 min    Greater than 50%  of this time was spent counseling and coordinating care related to the above assessment and plan.  Wadie Lessen NP  Palliative Medicine Team Team Phone # [phone] Pager [phone]  Discussed with SW 1

## 2014-10-18 NOTE — Progress Notes (Signed)
Pt will self administer home CPAP when ready for bed.  RT to monitor and assess as needed.

## 2014-10-18 NOTE — Telephone Encounter (Signed)
Left message to confirm appointment for May. Mailed calendar.

## 2014-10-18 NOTE — Progress Notes (Signed)
INFECTIOUS DISEASE PROGRESS NOTE  ID: James [name] is a 79 y.o. male with  Active Problems:   Coronary atherosclerosis of native coronary artery   Obstructive sleep apnea   Metastasis from bladder cancer   SIRS (systemic inflammatory response syndrome)   Leukopenia   Palliative care encounter   DNR (do not resuscitate) discussion   Pain, generalized   Weakness generalized  Subjective: Feels better, no oral ulcers, no diarrhea.   Abtx:  Anti-infectives    Start     Dose/Rate Route Frequency Ordered Stop   10/15/14 2245  vancomycin (VANCOCIN) IVPB 750 mg/150 ml premix  Status:  Discontinued     750 mg 150 mL/hr over 60 Minutes Intravenous 2 times daily 10/15/14 2243 10/16/14 1116   10/14/14 1000  vancomycin (VANCOCIN) IVPB 750 mg/150 ml premix  Status:  Discontinued     750 mg 150 mL/hr over 60 Minutes Intravenous Every 12 hours 10/13/14 2024 10/15/14 1151   10/14/14 0400  piperacillin-tazobactam (ZOSYN) IVPB 3.375 g  Status:  Discontinued     3.375 g 12.5 mL/hr over 240 Minutes Intravenous Every 8 hours 10/13/14 2023 10/16/14 1116   10/13/14 2345  oseltamivir (TAMIFLU) capsule 75 mg  Status:  Discontinued     75 mg Oral 2 times daily 10/13/14 2332 10/15/14 1329   10/13/14 1915  piperacillin-tazobactam (ZOSYN) IVPB 3.375 g     3.375 g 100 mL/hr over 30 Minutes Intravenous  Once 10/13/14 1906 10/13/14 2045   10/13/14 1915  vancomycin (VANCOCIN) IVPB 1000 mg/200 mL premix  Status:  Discontinued     1,000 mg 200 mL/hr over 60 Minutes Intravenous  Once 10/13/14 1906 10/13/14 1914   10/13/14 1915  vancomycin (VANCOCIN) 2,000 mg in sodium chloride 0.9 % 500 mL IVPB     2,000 mg 250 mL/hr over 120 Minutes Intravenous  Once 10/13/14 1915 10/13/14 2300      Medications:  Scheduled: . aspirin EC  81 mg Oral Daily  . atorvastatin  40 mg Oral q1800  . guaiFENesin  600 mg Oral BID  . heparin subcutaneous  5,000 Units Subcutaneous 3 times per day  . morphine  15 mg Oral Q12H  .  predniSONE  10 mg Oral Q breakfast  . sertraline  150 mg Oral Daily  . venlafaxine XR  150 mg Oral Daily    Objective: Vital signs in last 24 hours: Temp:  [97.4 F (36.3 C)-98.7 F (37.1 C)] 97.6 F (36.4 C) (03/09 1316) Pulse Rate:  [74-160] 105 (03/09 1316) Resp:  [14-20] 14 (03/09 1316) BP: (119-134)/(56-77) 126/72 mmHg (03/09 1316) SpO2:  [94 %-98 %] 95 % (03/09 1316)   General appearance: alert, cooperative and no distress Throat: no thrush, mild irriation of his lower lip. there are no vesicles.  Resp: clear to auscultation bilaterally Cardio: regular rate and rhythm GI: normal findings: bowel sounds normal and soft, non-tender Rash lighter  Lab Results  Recent Labs  10/17/14 0645 10/18/14 0625  WBC 1.5* 1.6*  HGB 10.0* 9.7*  HCT 30.7* 29.6*  NA 133* 133*  K 4.0 3.7  CL 106 101  CO2 21 23  BUN 20 19  CREATININE 1.24 1.15   Liver Panel No results for input(s): PROT, ALBUMIN, AST, ALT, ALKPHOS, BILITOT, BILIDIR, IBILI in the last 72 hours. Sedimentation Rate No results for input(s): ESRSEDRATE in the last 72 hours. C-Reactive Protein No results for input(s): CRP in the last 72 hours.  Microbiology: Recent Results (from the past 240 hour(s))  Culture, blood (routine x 2)     Status: None (Preliminary result)   Collection Time: 10/13/14  6:03 PM  Result Value Ref Range Status   Specimen Description BLOOD RIGHT ARM  Final   Special Requests BOTTLES DRAWN AEROBIC AND ANAEROBIC 5CC  Final   Culture   Final           BLOOD CULTURE RECEIVED NO GROWTH TO DATE CULTURE WILL BE HELD FOR 5 DAYS BEFORE ISSUING A FINAL NEGATIVE REPORT Performed at [facility]    Report Status PENDING  Incomplete  Culture, blood (routine x 2)     Status: None (Preliminary result)   Collection Time: 10/13/14  6:23 PM  Result Value Ref Range Status   Specimen Description BLOOD  Final   Special Requests BOTTLES DRAWN AEROBIC ONLY  Final   Culture   Final           BLOOD  CULTURE RECEIVED NO GROWTH TO DATE CULTURE WILL BE HELD FOR 5 DAYS BEFORE ISSUING A FINAL NEGATIVE REPORT Performed at [facility]    Report Status PENDING  Incomplete  Urine culture     Status: None   Collection Time: 10/13/14  8:53 PM  Result Value Ref Range Status   Specimen Description URINE, CLEAN CATCH  Final   Special Requests NONE  Final   Colony Count NO GROWTH Performed at [facility]   Final   Culture NO GROWTH Performed at [facility]   Final   Report Status 10/15/2014 FINAL  Final  Respiratory virus panel (routine influenza)     Status: None   Collection Time: 10/14/14  4:40 AM  Result Value Ref Range Status   Source - RVPAN NOSE  Corrected   Respiratory Syncytial Virus A Negative Negative Final   Respiratory Syncytial Virus B Negative Negative Final   Influenza A Negative Negative Final   Influenza B Negative Negative Final   Parainfluenza 1 Negative Negative Final   Parainfluenza 2 Negative Negative Final   Parainfluenza 3 Negative Negative Final   Metapneumovirus Negative Negative Final   Rhinovirus Negative Negative Final   Adenovirus Negative Negative Final    Comment: (NOTE) Performed At: [facility] [address] [place], [place] [id number] Lindon Romp MD GY:[id number]   Culture, expectorated sputum-assessment     Status: None   Collection Time: 10/14/14  1:54 PM  Result Value Ref Range Status   Specimen Description SPUTUM  Final   Special Requests Immunocompromised  Final   Sputum evaluation   Final    THIS SPECIMEN IS ACCEPTABLE. RESPIRATORY CULTURE REPORT TO FOLLOW.   Report Status 10/14/2014 FINAL  Final  Culture, respiratory (NON-Expectorated)     Status: None   Collection Time: 10/14/14  1:54 PM  Result Value Ref Range Status   Specimen Description SPUTUM  Final   Special Requests NONE  Final   Gram Stain   Final    RARE WBC PRESENT, PREDOMINANTLY PMN RARE SQUAMOUS EPITHELIAL CELLS PRESENT RARE  YEAST RARE GRAM POSITIVE COCCI IN PAIRS RARE GRAM NEGATIVE RODS    Culture   Final    NORMAL OROPHARYNGEAL FLORA Performed at [facility]    Report Status 10/17/2014 FINAL  Final    Studies/Results: Dg Chest 2 View  10/16/2014   CLINICAL DATA:  Fever 103. Acute kidney injury. No obvious source of infection.  EXAM: CHEST  2 VIEW  COMPARISON:  10/13/2014  FINDINGS: Right-sided PowerPort tip overlies the superior vena  cava. Heart size is upper normal. No focal consolidations. There are small bilateral pleural effusions. Degenerative changes are seen in the mid thoracic spine.  IMPRESSION: Small bilateral pleural effusions.  No consolidations.   Electronically Signed   By: Nolon Nations M.D.   On: 10/16/2014 17:12     Assessment/Plan: ARF Sepsis- source? Metastatic bladder CA Last dose of taxol ~ 1 week ago Neutropenia (ANC 900)  Total days of antibiotics: vanco/zosy- off for 48h  Pt appears to be doing well off anbx Attribute his ANC drop to his CTX Greatly appreciate onc eval.  He will re-evalulate his options at home.  We spoke > 30 minutes Will be available prn         Bobby Rumpf Infectious Diseases (pager) [phone] [email or web address] 10/18/2014, 4:00 PM  LOS: 5 days

## 2014-10-18 NOTE — Plan of Care (Signed)
Problem: Phase II Progression Outcomes Goal: Pain controlled Outcome: Progressing MS Contin added to pain regimen.

## 2014-10-18 NOTE — Evaluation (Signed)
Occupational Therapy Evaluation Patient Details Name: James [name] MRN: [id number] DOB: March 24, 1936 Today's Date: 10/18/2014    History of Present Illness 79 y.o. male with history of CAD status post stenting, atrial flutter status post ablation, metastatic bladder carcinoma, OSA on CPap, dyslipidemia admitted for SIRS, fever, and acute kidney injury   Clinical Impression   Pt up to chair and then needing to use [facility] so assisted pt to [facility] and nursing tech in room to assist pt back to chair. Chair alarm placed in chair. Educated pt and wife extensively on DME options including 3in1 and tubbench. Pt's wife will look at what DME they can borrow. Case manager--please address with pt/family the cost of a wide 3in1 if they decide to obtain. Pt states the standard 3in1 is not wide enough for him.  Thank you. Will follow on acute for family education and progress ADL.    Follow Up Recommendations  Home health OT;Supervision/Assistance - 24 hour    Equipment Recommendations  3 in 1 bedside comode (family may prefer wide 3in1.)    Recommendations for Other Services       Precautions / Restrictions Precautions Precautions: Fall Restrictions Weight Bearing Restrictions: No      Mobility Bed Mobility Overal bed mobility: Modified Independent                Transfers Overall transfer level: Needs assistance Equipment used: Rolling walker (2 wheeled) Transfers: Sit to/from Stand Sit to Stand: Min assist         General transfer comment: verbal cues. min assist to steady.     Balance                                            ADL Overall ADL's : Needs assistance/impaired Eating/Feeding: Independent;Sitting   Grooming: Wash/dry hands;Set up;Sitting   Upper Body Bathing: Set up;Sitting   Lower Body Bathing: Minimal assistance;Sit to/from stand   Upper Body Dressing : Minimal assistance;Sitting Upper Body Dressing Details (indicate cue type and reason):  with gown Lower Body Dressing: Minimal assistance;Sit to/from stand   Toilet Transfer: Minimal assistance;Stand-pivot;RW   Toileting- Clothing Manipulation and Hygiene: Minimal assistance;Sit to/from stand         General ADL Comments: Discussed at length options for tub DME. Pt has a tub on the main level and has a tubseat. Discussed tub transfer bench option also. Wife to look into options further. Also discussed 3in1 over toilet and pt and wife like 3in1 but concerned that standard 3in1 is not wide enough for him. Explained wide option but may not be covered by insurance. Wife to see what DME she may be able to borrow. Pt able to lean forward and don socks but with increased time.      Vision     Perception     Praxis      Pertinent Vitals/Pain Pain Assessment: No/denies pain     Hand Dominance     Extremity/Trunk Assessment Upper Extremity Assessment Upper Extremity Assessment: Generalized weakness           Communication Communication Communication: No difficulties   Cognition Arousal/Alertness: Awake/alert Behavior During Therapy: WFL for tasks assessed/performed Overall Cognitive Status: Within Functional Limits for tasks assessed                     General Comments  Exercises       Shoulder Instructions      Home Living Family/patient expects to be discharged to:: Private residence Living Arrangements: Spouse/significant other Available Help at Discharge: Family;Available 24 hours/day Type of Home: House Home Access: Stairs to enter [employer] of Steps: "a few"   Home Layout: Two level;Bed/bath upstairs Alternate Level Stairs-Number of Steps: 14   Bathroom Shower/Tub: Tub/shower unit (main level)   Bathroom Toilet: Standard     Home Equipment: Cane - single point;Walker - 4 wheels;Tub bench (rollator)          Prior Functioning/Environment Level of Independence: Independent with assistive device(s)         Comments: has been using "walking sticks" recently when not feeling well, states he has abdundant assist available at home    OT Diagnosis: Generalized weakness   OT Problem List: Decreased strength;Decreased knowledge of use of DME or AE   OT Treatment/Interventions: Self-care/ADL training;Patient/family education;Therapeutic activities;DME and/or AE instruction    OT Goals(Current goals can be found in the care plan section) Acute Rehab OT Goals Patient Stated Goal: pt wants to get up to chair OT Goal Formulation: With patient/family Time For Goal Achievement: 10/25/14 Potential to Achieve Goals: Good  OT Frequency: Min 2X/week   Barriers to D/C:            Co-evaluation              End of Session Equipment Utilized During Treatment: Gait belt;Rolling walker  Activity Tolerance: Patient tolerated treatment well Patient left: Other (comment) (with nursing tech on [facility])   Time: 5462-7035 OT Time Calculation (min): 43 min Charges:  OT General Charges $OT Visit: 1 Procedure OT Evaluation $Initial OT Evaluation Tier I: 1 Procedure OT Treatments $Self Care/Home Management : 8-22 mins $Therapeutic Activity: 8-22 mins G-Codes:    Jules Schick  [phone] 10/18/2014, 12:05 PM

## 2014-10-18 NOTE — Progress Notes (Signed)
PT Cancellation Note  Patient Details Name: James [name] MRN: [id number] DOB: 1936/06/24   Cancelled Treatment:    Reason Eval/Treat Not Completed: Other (comment) Called room to see if now was a good time to work with pt however spouse answered and reports CSW and CM in to speak with pt.  Spouse reports pt does not have good fitting walker at home (due to wider hips/thighs per spouse) so recommend heavy duty rollator with wheels for pt safety and quality of life as he would like to get back to ambulating outside.   Tamesha Ellerbrock,KATHrine E 10/18/2014, 3:02 PM Carmelia Bake, PT, DPT 10/18/2014 Pager: [phone]

## 2014-10-18 NOTE — Progress Notes (Signed)
[facility] PROGRESS NOTE  James [name] JYN:[id number] DOB: 1936/01/27 DOA: 10/13/2014 PCP: Dione Plover, MD  Summary I have seen and examined James [name] at bedside in the presence of his wife and reviewed his chart. Appreciate oncology/ID/palliative care. James [name] is a pleasant 79 year old gentleman with metastatic bladder cancer receiving his care at [facility], recently started on Taxol therapy and admitted to the medicine service on 10/13/2014 after presenting with temperature of 103F. Patient was hospitalized at [facility] on 08/28/2014, discharged 09/04/2014 where he underwent workup for fever of unknown origin. During that hospitalization he received empiric IV and microbial therapy with vancomycin and cefepime. He underwent an extensive workup which included lumbar puncture and source of infection was not found. During that hospitalization he was seen by Dr. Megan Salon of infectious disease. Initial lab work performed at this facility has not shown obvious source of infection as urinalysis, chest x-ray, have been unremarkable. He received 4 days of broad spectrum IV antimicrobial therapy with Vancomycin and Zosyn during this hospitalization, did not spike any more fevers. Cultures remained negative. His IV antimicrobials were stopped on 10/16/2014 and followed off of antibiotics. The thought is that Fever may have been related to viral syndrome or recent chemotherapy. Family expressing wishes to consolidate care and transfer his oncology care from [place] to the [facility]. Dr Lindi Adie of medical oncology was consulted. Also expressed interest in palliative care, consultation placed as well. Plans are for discharge in the next 24-48 hours if he continues to do well. Per occupational therapy, patient will need to 3 in 1 commode which I have requested and they're contemplating discharge with condom catheter. He will need home health services. Plan SIRS (systemic  inflammatory response syndrome)/Weakness generalized  Afebrile off antibiotics  Monitor Metastasis from bladder cancer/Leukopenia/Palliative care encounter/Pain, generalized/DNR (do not resuscitate) discussion  Follow oncology/palliative care recommendations at discharge Coronary atherosclerosis of native coronary artery/Obstructive sleep apnea  No acute changes Code Status: DNR Family Communication: Wife at bedside. Disposition Plan: Hopefully home in the next 24-48 hours   Consultants:  Oncology  ID  Palliative care  Procedures:  None  Antibiotics:  Vancomycin<10/16/14  Cefepime<10/16/14  HPI/Subjective: Went over multiple issues.  Objective: Filed Vitals:   10/18/14 1316  BP: 126/72  Pulse: 105  Temp: 97.6 F (36.4 C)  Resp: 14    Intake/Output Summary (Last 24 hours) at 10/18/14 1810 Last data filed at 10/18/14 1147  Gross per 24 hour  Intake    720 ml  Output   3000 ml  Net  -2280 ml   Filed Weights   10/13/14 2315  Weight: 114.397 kg (252 lb 3.2 oz)    Exam:   General:  Comfortable at rest.  Cardiovascular: S1-S2 normal. No murmurs. Pulse regular.  Respiratory: Good air entry bilaterally. No rhonchi or rales.  Abdomen: Soft and nontender. Normal bowel sounds. No organomegaly.  Musculoskeletal: No pedal edema   Neurological: Intact  Data Reviewed: Basic Metabolic Panel:  Recent Labs Lab 10/14/14 0500 10/15/14 0510 10/15/14 1400 10/16/14 0813 10/17/14 0645 10/18/14 0625  NA 132* 134* 133* 135 133* 133*  K 3.7 3.7 4.1 3.8 4.0 3.7  CL 103 105 105 109 106 101  CO2 22 22 21 20 21 23   GLUCOSE 118* 116* 144* 129* 120* 100*  BUN 25* 34* 29* 24* 20 19  CREATININE 1.32 1.84* 1.66* 1.51* 1.24 1.15  CALCIUM 7.8* 8.0* 8.1* 8.0* 8.3* 8.0*  MG 1.5  --   --   --   --   --  PHOS 3.5  --   --   --   --   --    Liver Function Tests:  Recent Labs Lab 10/14/14 0500  AST 26  ALT 22  ALKPHOS 44  BILITOT 1.5*  PROT 6.5  ALBUMIN 3.1*    No results for input(s): LIPASE, AMYLASE in the last 168 hours. No results for input(s): AMMONIA in the last 168 hours. CBC:  Recent Labs Lab 10/13/14 1803 10/14/14 0500 10/15/14 0510 10/16/14 0620 10/17/14 0645 10/18/14 0625  WBC 7.2 9.5 6.8 3.4* 1.5* 1.6*  NEUTROABS 6.3  --   --   --  1.2* 0.9*  HGB 10.9* 11.1* 9.8* 9.3* 10.0* 9.7*  HCT 34.3* 34.6* 29.7* 28.3* 30.7* 29.6*  MCV 97.4 97.2 96.7 96.6 96.2 96.1  PLT 150 123* 94* 82* 109* 101*   Cardiac Enzymes: No results for input(s): CKTOTAL, CKMB, CKMBINDEX, TROPONINI in the last 168 hours. BNP (last 3 results)  Recent Labs  08/29/14 0135  BNP 117.9*    ProBNP (last 3 results) No results for input(s): PROBNP in the last 8760 hours.  CBG: No results for input(s): GLUCAP in the last 168 hours.  Recent Results (from the past 240 hour(s))  Culture, blood (routine x 2)     Status: None (Preliminary result)   Collection Time: 10/13/14  6:03 PM  Result Value Ref Range Status   Specimen Description BLOOD RIGHT ARM  Final   Special Requests BOTTLES DRAWN AEROBIC AND ANAEROBIC 5CC  Final   Culture   Final           BLOOD CULTURE RECEIVED NO GROWTH TO DATE CULTURE WILL BE HELD FOR 5 DAYS BEFORE ISSUING A FINAL NEGATIVE REPORT Performed at [facility]    Report Status PENDING  Incomplete  Culture, blood (routine x 2)     Status: None (Preliminary result)   Collection Time: 10/13/14  6:23 PM  Result Value Ref Range Status   Specimen Description BLOOD  Final   Special Requests BOTTLES DRAWN AEROBIC ONLY  Final   Culture   Final           BLOOD CULTURE RECEIVED NO GROWTH TO DATE CULTURE WILL BE HELD FOR 5 DAYS BEFORE ISSUING A FINAL NEGATIVE REPORT Performed at [facility]    Report Status PENDING  Incomplete  Urine culture     Status: None   Collection Time: 10/13/14  8:53 PM  Result Value Ref Range Status   Specimen Description URINE, CLEAN CATCH  Final   Special Requests NONE  Final   Colony  Count NO GROWTH Performed at [facility]   Final   Culture NO GROWTH Performed at [facility]   Final   Report Status 10/15/2014 FINAL  Final  Respiratory virus panel (routine influenza)     Status: None   Collection Time: 10/14/14  4:40 AM  Result Value Ref Range Status   Source - RVPAN NOSE  Corrected   Respiratory Syncytial Virus A Negative Negative Final   Respiratory Syncytial Virus B Negative Negative Final   Influenza A Negative Negative Final   Influenza B Negative Negative Final   Parainfluenza 1 Negative Negative Final   Parainfluenza 2 Negative Negative Final   Parainfluenza 3 Negative Negative Final   Metapneumovirus Negative Negative Final   Rhinovirus Negative Negative Final   Adenovirus Negative Negative Final    Comment: (NOTE) Performed At: [facility] [place], [place] [id number] Lindon Romp MD  RF:[id number]   Culture, expectorated sputum-assessment     Status: None   Collection Time: 10/14/14  1:54 PM  Result Value Ref Range Status   Specimen Description SPUTUM  Final   Special Requests Immunocompromised  Final   Sputum evaluation   Final    THIS SPECIMEN IS ACCEPTABLE. RESPIRATORY CULTURE REPORT TO FOLLOW.   Report Status 10/14/2014 FINAL  Final  Culture, respiratory (NON-Expectorated)     Status: None   Collection Time: 10/14/14  1:54 PM  Result Value Ref Range Status   Specimen Description SPUTUM  Final   Special Requests NONE  Final   Gram Stain   Final    RARE WBC PRESENT, PREDOMINANTLY PMN RARE SQUAMOUS EPITHELIAL CELLS PRESENT RARE YEAST RARE GRAM POSITIVE COCCI IN PAIRS RARE GRAM NEGATIVE RODS    Culture   Final    NORMAL OROPHARYNGEAL FLORA Performed at [facility]    Report Status 10/17/2014 FINAL  Final     Studies: No results found.  Scheduled Meds: . aspirin EC  81 mg Oral Daily  . atorvastatin  40 mg Oral q1800  . guaiFENesin  600 mg Oral BID  . heparin subcutaneous   5,000 Units Subcutaneous 3 times per day  . morphine  15 mg Oral Q12H  . predniSONE  10 mg Oral Q breakfast  . sertraline  150 mg Oral Daily  . venlafaxine XR  150 mg Oral Daily   Continuous Infusions:    Time spent: 25 minutes    Jammal Sarr  [facility] Pager [phone]. If 7PM-7AM, please contact night-coverage at [email or web address], password [facility] 10/18/2014, 6:10 PM  LOS: 5 days

## 2014-10-19 ENCOUNTER — Inpatient Hospital Stay (HOSPITAL_COMMUNITY): Payer: Medicare Other

## 2014-10-19 ENCOUNTER — Encounter (HOSPITAL_COMMUNITY): Payer: Self-pay | Admitting: Radiology

## 2014-10-19 DIAGNOSIS — R109 Unspecified abdominal pain: Secondary | ICD-10-CM | POA: Diagnosis present

## 2014-10-19 DIAGNOSIS — M79606 Pain in leg, unspecified: Secondary | ICD-10-CM | POA: Diagnosis present

## 2014-10-19 LAB — CBC WITH DIFFERENTIAL/PLATELET
BASOS ABS: 0 10*3/uL (ref 0.0–0.1)
Basophils Relative: 1 % (ref 0–1)
EOS PCT: 0 % (ref 0–5)
Eosinophils Absolute: 0 10*3/uL (ref 0.0–0.7)
HCT: 31 % — ABNORMAL LOW (ref 39.0–52.0)
Hemoglobin: 9.9 g/dL — ABNORMAL LOW (ref 13.0–17.0)
Lymphocytes Relative: 43 % (ref 12–46)
Lymphs Abs: 0.8 10*3/uL (ref 0.7–4.0)
MCH: 30.7 pg (ref 26.0–34.0)
MCHC: 31.9 g/dL (ref 30.0–36.0)
MCV: 96 fL (ref 78.0–100.0)
MONO ABS: 0.2 10*3/uL (ref 0.1–1.0)
Monocytes Relative: 12 % (ref 3–12)
Neutro Abs: 0.9 10*3/uL — ABNORMAL LOW (ref 1.7–7.7)
Neutrophils Relative %: 44 % (ref 43–77)
Platelets: 120 10*3/uL — ABNORMAL LOW (ref 150–400)
RBC: 3.23 MIL/uL — ABNORMAL LOW (ref 4.22–5.81)
RDW: 16.8 % — ABNORMAL HIGH (ref 11.5–15.5)
WBC: 1.9 10*3/uL — AB (ref 4.0–10.5)

## 2014-10-19 LAB — COMPREHENSIVE METABOLIC PANEL
ALBUMIN: 3 g/dL — AB (ref 3.5–5.2)
ALK PHOS: 42 U/L (ref 39–117)
ALT: 36 U/L (ref 0–53)
AST: 30 U/L (ref 0–37)
Anion gap: 7 (ref 5–15)
BILIRUBIN TOTAL: 0.5 mg/dL (ref 0.3–1.2)
BUN: 21 mg/dL (ref 6–23)
CO2: 25 mmol/L (ref 19–32)
Calcium: 8.4 mg/dL (ref 8.4–10.5)
Chloride: 104 mmol/L (ref 96–112)
Creatinine, Ser: 1.14 mg/dL (ref 0.50–1.35)
GFR calc non Af Amer: 60 mL/min — ABNORMAL LOW (ref >90)
GFR, EST AFRICAN AMERICAN: 70 mL/min — AB (ref >90)
Glucose, Bld: 108 mg/dL — ABNORMAL HIGH (ref 70–99)
Potassium: 4 mmol/L (ref 3.5–5.1)
SODIUM: 136 mmol/L (ref 135–145)
Total Protein: 6.5 g/dL (ref 6.0–8.3)

## 2014-10-19 MED ORDER — ENOXAPARIN SODIUM 120 MG/0.8ML ~~LOC~~ SOLN
1.0000 mg/kg | Freq: Two times a day (BID) | SUBCUTANEOUS | Status: DC
Start: 2014-10-19 — End: 2014-10-20
  Administered 2014-10-19: 115 mg via SUBCUTANEOUS
  Filled 2014-10-19 (×4): qty 0.8

## 2014-10-19 MED ORDER — IOHEXOL 300 MG/ML  SOLN
100.0000 mL | Freq: Once | INTRAMUSCULAR | Status: AC | PRN
  Administered 2014-10-19: 100 mL via INTRAVENOUS

## 2014-10-19 MED ORDER — IOHEXOL 300 MG/ML  SOLN
50.0000 mL | INTRAMUSCULAR | Status: AC
  Administered 2014-10-19 (×2): 50 mL via ORAL

## 2014-10-19 NOTE — Care Management Note (Signed)
CARE MANAGEMENT NOTE 10/19/2014  Patient:  James [name], James [name]   Account Number:  [id number]  Date Initiated:  10/18/2014  Documentation initiated by:  Marney Doctor  Subjective/Objective Assessment:   79 yo admitted with SIRS. Hx of metastatic bladder ca.     Action/Plan:   From home with wife.   Anticipated DC Date:  10/19/2014   Anticipated DC Plan:  [place]  In-house referral  Clinical Social Worker      DC Planning Services  CM consult      [facility] Choice  Resumption Of Svcs/PTA Provider   Choice offered to / List presented to:             [place].   Status of service:  In process, will continue to follow Medicare Important Message given?  yes (If response is "NO", the following Medicare IM given date fields will be blank) Date Medicare IM given:  10/19/14 Medicare IM given by:  Marney Doctor RN Date Additional Medicare IM given:   Additional Medicare IM given by:    Discharge Disposition:    Per UR Regulation:  Reviewed for med. necessity/level of care/duration of stay  If discussed at [place] of Stay Meetings, dates discussed:    Comments:  10/19/14 Marney Doctor RN,BSN,NCM [phone] Met with pt, wife and son at bedside for DC planning.  Pt wants to go back home with [facility]. SNF list provided to wife in case they decide that pt needs too much care at home and needs to go to snf.  Pt needs wide width 3 in 1 and Rollator with seat.  MD orders in for both.  RN to print off orders so wife can take them to the [facility] store and chose the ones that are best for pt.  [place] orders in and [facility] rep called to inform that pt would be dc'd today. No other DC needs noted  10/18/14 Marney Doctor RN,BSN,NCM [phone] Pt is active with [facility]. This CM and CSW met with pt, wife and son at bedside to discuss disposition. PT is recommending SNF. OT is recommending HHOT with 24 hr supervision.  Pt would ultimately like to go home at DC but pt and family would like  SNF options as well.  Private duty care list given to wife.  Pt and family to think about options and will have decision tomorrow morning.

## 2014-10-19 NOTE — Progress Notes (Signed)
Per request of PMT, James Lessen, NP to provide family for opportunity for additional hospice education- Writer met at bedside with patient his son, James [name], and pt's wife, James [name] .Also present was Danton Sewer, Therapist, sports.  A general  overview of Hospice philosophy, services, and  team approach to care was discussed.  Educational Hospice handouts provided per family request. Patient/family continuing to gather information for future decisions. Contact information for [facility] given should they desire additional information in the future. [facility] Liaison [phone]

## 2014-10-19 NOTE — Progress Notes (Signed)
Physical Therapy Treatment Patient Details Name: James [name] MRN: [id number] DOB: 14-Apr-1936 Today's Date: 10/19/2014    History of Present Illness 79 y.o. male with history of CAD status post stenting, atrial flutter status post ablation, metastatic bladder carcinoma, OSA on CPap, dyslipidemia admitted for SIRS, fever, and acute kidney injury    PT Comments    Pt feeling better and tolerated amb all around the unit with bariatric RW and 2 sitting rest breaks.  BP 129/70(84), HR 75, RA 98%.  No c/o's.  Feeling slightly "foggy" but definitely "better".  Eager to D/C to home.   Follow Up Recommendations  Home health PT (pt prefers to D/C to home and will have 24/7 family assist)     Equipment Recommendations  Other (comment) (Bariatric Rollator [phone] with seat))    Recommendations for Other Services       Precautions / Restrictions Precautions Precautions: Fall Restrictions Weight Bearing Restrictions: No    Mobility  Bed Mobility Overal bed mobility: Modified Independent             General bed mobility comments: increased time  Transfers Overall transfer level: Needs assistance Equipment used: Rolling walker (2 wheeled) (bariatric) Transfers: Sit to/from Stand Sit to Stand: Supervision;Min guard         General transfer comment: one VC on hand placement with stand to sit  Ambulation/Gait Ambulation/Gait assistance: Supervision;Min guard Ambulation Distance (Feet): 350 Feet Assistive device: Rolling walker (2 wheeled) (bariatric) Gait Pattern/deviations: Step-through pattern;Trunk flexed Gait velocity: decreased but functional    General Gait Details: son assisted by following with recliner for safety.  Tolerated increased distance a total of 350 feet with 2 sitting rest breaks.    Stairs            Wheelchair Mobility    Modified Rankin (Stroke Patients Only)       Balance                                    Cognition  Arousal/Alertness: Awake/alert Behavior During Therapy: WFL for tasks assessed/performed (slightly foggy) Overall Cognitive Status: Within Functional Limits for tasks assessed                      Exercises      General Comments        Pertinent Vitals/Pain Pain Assessment: No/denies pain    Home Living                      Prior Function            PT Goals (current goals can now be found in the care plan section) Progress towards PT goals: Progressing toward goals    Frequency  Min 3X/week    PT Plan      Co-evaluation             End of Session Equipment Utilized During Treatment: Gait belt Activity Tolerance: Patient tolerated treatment well Patient left: in chair;with call bell/phone within reach;with family/visitor present     Time: 0922-0954 PT Time Calculation (min) (ACUTE ONLY): 32 min  Charges:  $Gait Training: 8-22 mins $Therapeutic Activity: 8-22 mins                    G Codes:      Rica Koyanagi  PTA [facility]  Acute  Rehab Pager  [phone]  

## 2014-10-19 NOTE — Progress Notes (Addendum)
Clinical Social Work Department BRIEF PSYCHOSOCIAL ASSESSMENT 10/19/2014  Patient:  James [name], James [name]     Account Number:  [id number]     Admit date:  10/13/2014  Clinical Social Worker:  Maryln Manuel  Date/Time:  10/18/2014 03:00 PM  Referred by:  Physician  Date Referred:  10/18/2014 Referred for  SNF Placement   Other Referral:   Interview type:  Patient Other interview type:   and patient wife and pt son at bedside    PSYCHOSOCIAL DATA Living Status:  WIFE Admitted from facility:   Level of care:   Primary support name:  James [name]/spouse/[phone] Primary support relationship to patient:  SPOUSE Degree of support available:   strong    CURRENT CONCERNS Current Concerns  Post-Acute Placement   Other Concerns:    SOCIAL WORK ASSESSMENT / PLAN CSW reviewed chart and noted that PT recommended SNF, but other documentation states pt spouse wishes for home with home health services.    CSW and RNCM met with pt and pt spouse at bedside. Pt son entered later in the conversation. CSW provided supportive listening as pt spouse discussed that pt recently received chemotherapy treatment at [facility] and had been doing well at home until a week ago. Pt wife shared that pt and pt family understand that pt has poor prognosis and pt wants to maintain his quality of life. CSW and RNCM discussed disposition needs. Pt wife shared that they are hopeful for pt to return home. CSW and RNCM discussed PT recommendation for SNF. Pt wife states that they are interested in exploring options, but will likely stick with plan to return home. CSW expressed understanding and discussed process of SNF search. Pt and pt family agreeable to initiation of SNF search to [facility] in order for pt and pt family to have options to consider.    CSW completed FL2 and initiated SNF search to [facility].    CSW to follow up with pt and pt family re: SNF bed offers.    CSW to continue to follow  to provide support and assist with pt disposition needs.   Assessment/plan status:  Psychosocial Support/Ongoing Assessment of Needs Other assessment/ plan:   discharge planning   Information/referral to community resources:   [facility] list    PATIENT'S/FAMILY'S RESPONSE TO PLAN OF CARE: Pt alert and oriented x 4, but dosing in and out of sleep during visit. Pt has strong support from pt wife and pt son. Pt and pt family have good understanding of pt cancer and pt wants to concentrate on his quality of life at this time. Pt wife feels that pt will be happiest at home, but appreciate CSW exploring SNF rehab options just in case.    Alison Murray, MSW, [place] Work [phone]

## 2014-10-19 NOTE — Progress Notes (Signed)
[facility] PROGRESS NOTE  James [name] VVO:[id number] DOB: February 14, 1936 DOA: 10/13/2014 PCP: Dione Plover, MD  Summary Appreciate oncology/ID/palliative care. James [name] is a pleasant 79 year old gentleman with metastatic bladder cancer receiving his care at [facility], recently started on Taxol therapy and admitted to the medicine service on 10/13/2014 after presenting with temperature of 103F. Patient was hospitalized at [facility] on 08/28/2014, discharged 09/04/2014 where he underwent workup for fever of unknown origin. During that hospitalization he received empiric IV and microbial therapy with vancomycin and cefepime. He underwent an extensive workup which included lumbar puncture and source of infection was not found. During that hospitalization he was seen by Dr. Megan Salon of infectious disease. Initial lab work performed at this facility has not shown obvious source of infection as urinalysis, chest x-ray, have been unremarkable. He received 4 days of broad spectrum IV antimicrobial therapy with Vancomycin and Zosyn during this hospitalization, did not spike any more fevers. Cultures remained negative. His IV antimicrobials were stopped on 10/16/2014 and followed off of antibiotics. The thought is that Fever may have been related to viral syndrome or recent chemotherapy. Family expressing wishes to consolidate care and transfer his oncology care from [place] to the [facility] Dr Lindi Adie of medical oncology was consulted. Also expressed interest in palliative care, consultation placed as well. Patient initially opted for DNR status, but today he and his family feel that he made that decision thinking he was not going to make it and they would like for him to be full code at this time. Patient continued to complain of lower extremity pain who is at urology was not clear and this prompted CT chest and abdomen with contrast which showed, "1. Advanced thoracic  adenopathy when compared to the prior study concerning for metastasis. 2. Interval development of left adrenal mass, concerning for metastasis. 3. Retroperitoneal adenopathy in the periaortic and aortocaval region new from prior study consistent with metastasis. This extends along the right iliac chain. 4. There is also a retroperitoneal inflammatory change in the abdomen and pelvis. Both iliac veins are somewhat prominent, but the inferior vena cava is in conspicuous below the level of the renal veins. Possibility of inferior vena cava thrombus not excluded. Consider further evaluation, possibly with lower extremity venous doppler. 5. 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 Peritoneal implant posterior to the inferior spleen concerning for metastatic disease". I reviewed CT abdomen and pelvis report done at [facility] on 09/20/2014 and it seems like the major change is the question of IVC thrombus. Will therefore obtain venous Doppler of the lower extremities and give therapeutic doses of Lovenox and to this possibility has been eliminated. Patient delivered disappointed if he 1 be going home today but if tests are negative, he should be able to go home tomorrow as initially planned. Per occupational therapy, patient will need 3 in 1 commode which I have requested and they're contemplating discharge with condom catheter. He will need home health services. Plan SIRS (systemic inflammatory response syndrome)/Weakness generalized  Afebrile off antibiotics  Monitor Metastasis from bladder cancer/Leukopenia/Palliative care encounter/Pain, generalized/DNR (do not resuscitate) discussion  Follow oncology/palliative care recommendations at discharge Coronary atherosclerosis of native coronary artery/Obstructive sleep apnea  No acute changes Possibility of IVC thrombus  Venous Doppler  Therapeutic doses of Lovenox Code Status: DNR Family Communication: Wife/son at bedside. Disposition Plan: Hopefully  home in the next 24-48 hours   Consultants:  Oncology  ID  Palliative care  Procedures:  None  Antibiotics:  Vancomycin<10/16/14  Cefepime<10/16/14  HPI/Subjective: Still has lower extremity pains.  Objective: Filed Vitals:   10/19/14 1618  BP: 128/68  Pulse: 78  Temp: 97.9 F (36.6 C)  Resp: 18    Intake/Output Summary (Last 24 hours) at 10/19/14 2004 Last data filed at 10/19/14 1618  Gross per 24 hour  Intake    580 ml  Output   3725 ml  Net  -3145 ml   Filed Weights   10/13/14 2315  Weight: 114.397 kg (252 lb 3.2 oz)    Exam:   General:  Comfortable at rest.  Cardiovascular: S1-S2 normal. No murmurs. Pulse regular.  Respiratory: Good air entry bilaterally. No rhonchi or rales.  Abdomen: Soft and nontender. Normal bowel sounds. No organomegaly.  Musculoskeletal: No pedal edema   Neurological: Intact  Data Reviewed: Basic Metabolic Panel:  Recent Labs Lab 10/14/14 0500  10/15/14 1400 10/16/14 0813 10/17/14 0645 10/18/14 0625 10/19/14 0846  NA 132*  < > 133* 135 133* 133* 136  K 3.7  < > 4.1 3.8 4.0 3.7 4.0  CL 103  < > 105 109 106 101 104  CO2 22  < > 21 20 21 23 25   GLUCOSE 118*  < > 144* 129* 120* 100* 108*  BUN 25*  < > 29* 24* 20 19 21   CREATININE 1.32  < > 1.66* 1.51* 1.24 1.15 1.14  CALCIUM 7.8*  < > 8.1* 8.0* 8.3* 8.0* 8.4  MG 1.5  --   --   --   --   --   --   PHOS 3.5  --   --   --   --   --   --   < > = values in this interval not displayed. Liver Function Tests:  Recent Labs Lab 10/14/14 0500 10/19/14 0846  AST 26 30  ALT 22 36  ALKPHOS 44 42  BILITOT 1.5* 0.5  PROT 6.5 6.5  ALBUMIN 3.1* 3.0*   No results for input(s): LIPASE, AMYLASE in the last 168 hours. No results for input(s): AMMONIA in the last 168 hours. CBC:  Recent Labs Lab 10/13/14 1803  10/15/14 0510 10/16/14 0620 10/17/14 0645 10/18/14 0625 10/19/14 0846  WBC 7.2  < > 6.8 3.4* 1.5* 1.6* 1.9*  NEUTROABS 6.3  --   --   --  1.2* 0.9* 0.9*   HGB 10.9*  < > 9.8* 9.3* 10.0* 9.7* 9.9*  HCT 34.3*  < > 29.7* 28.3* 30.7* 29.6* 31.0*  MCV 97.4  < > 96.7 96.6 96.2 96.1 96.0  PLT 150  < > 94* 82* 109* 101* 120*  < > = values in this interval not displayed. Cardiac Enzymes: No results for input(s): CKTOTAL, CKMB, CKMBINDEX, TROPONINI in the last 168 hours. BNP (last 3 results)  Recent Labs  08/29/14 0135  BNP 117.9*    ProBNP (last 3 results) No results for input(s): PROBNP in the last 8760 hours.  CBG: No results for input(s): GLUCAP in the last 168 hours.  Recent Results (from the past 240 hour(s))  Culture, blood (routine x 2)     Status: None (Preliminary result)   Collection Time: 10/13/14  6:03 PM  Result Value Ref Range Status   Specimen Description BLOOD RIGHT ARM  Final   Special Requests BOTTLES DRAWN AEROBIC AND ANAEROBIC 5CC  Final   Culture   Final           BLOOD CULTURE RECEIVED NO GROWTH TO DATE CULTURE WILL  BE HELD FOR 5 DAYS BEFORE ISSUING A FINAL NEGATIVE REPORT Performed at [facility]    Report Status PENDING  Incomplete  Culture, blood (routine x 2)     Status: None (Preliminary result)   Collection Time: 10/13/14  6:23 PM  Result Value Ref Range Status   Specimen Description BLOOD  Final   Special Requests BOTTLES DRAWN AEROBIC ONLY  Final   Culture   Final           BLOOD CULTURE RECEIVED NO GROWTH TO DATE CULTURE WILL BE HELD FOR 5 DAYS BEFORE ISSUING A FINAL NEGATIVE REPORT Performed at [facility]    Report Status PENDING  Incomplete  Urine culture     Status: None   Collection Time: 10/13/14  8:53 PM  Result Value Ref Range Status   Specimen Description URINE, CLEAN CATCH  Final   Special Requests NONE  Final   Colony Count NO GROWTH Performed at [facility]   Final   Culture NO GROWTH Performed at [facility]   Final   Report Status 10/15/2014 FINAL  Final  Respiratory virus panel (routine influenza)     Status: None   Collection Time:  10/14/14  4:40 AM  Result Value Ref Range Status   Source - RVPAN NOSE  Corrected   Respiratory Syncytial Virus A Negative Negative Final   Respiratory Syncytial Virus B Negative Negative Final   Influenza A Negative Negative Final   Influenza B Negative Negative Final   Parainfluenza 1 Negative Negative Final   Parainfluenza 2 Negative Negative Final   Parainfluenza 3 Negative Negative Final   Metapneumovirus Negative Negative Final   Rhinovirus Negative Negative Final   Adenovirus Negative Negative Final    Comment: (NOTE) Performed At: [facility] [place], [place] [id number] Lindon Romp MD YI:[id number]   Culture, expectorated sputum-assessment     Status: None   Collection Time: 10/14/14  1:54 PM  Result Value Ref Range Status   Specimen Description SPUTUM  Final   Special Requests Immunocompromised  Final   Sputum evaluation   Final    THIS SPECIMEN IS ACCEPTABLE. RESPIRATORY CULTURE REPORT TO FOLLOW.   Report Status 10/14/2014 FINAL  Final  Culture, respiratory (NON-Expectorated)     Status: None   Collection Time: 10/14/14  1:54 PM  Result Value Ref Range Status   Specimen Description SPUTUM  Final   Special Requests NONE  Final   Gram Stain   Final    RARE WBC PRESENT, PREDOMINANTLY PMN RARE SQUAMOUS EPITHELIAL CELLS PRESENT RARE YEAST RARE GRAM POSITIVE COCCI IN PAIRS RARE GRAM NEGATIVE RODS    Culture   Final    NORMAL OROPHARYNGEAL FLORA Performed at [facility]    Report Status 10/17/2014 FINAL  Final     Studies: Ct Chest W Contrast  10/19/2014   CLINICAL DATA:  Subsequent evaluation for allergic reaction to chemotherapy, rash and fever at the time of reaction with persistent rash currently, personal history of bladder cancer, chest pain  EXAM: CT CHEST, ABDOMEN, AND PELVIS WITH CONTRAST  TECHNIQUE: Multidetector CT imaging of the chest, abdomen and pelvis was performed following the standard protocol during bolus  administration of intravenous contrast.  CONTRAST:  146mL OMNIPAQUE IOHEXOL 300 MG/ML  SOLN  COMPARISON:  Chest CT 08/02/2013  FINDINGS: CT CHEST FINDINGS  Left anterior descending artery stent. Multifocal coronary arterial calcification. Right Port-A-Cath with tip to the cavoatrial junction.  Tiny bilateral pleural  effusions with mild bibasilar atelectasis. Minimal pericardial thickening.  Numerous enlarged mediastinal lymph nodes. Pretracheal lymph node shows short axis diameter of 20 mm, larger than it was previously. Numerous superior mediastinal lymph nodes new or enlarged when compared to the prior study, measuring greater than 10 mm in short axis. Sub carinal adenopathy demonstrate short axis diameter of 16 mm. This is new or enlarged when compared to the prior study.  No abnormal pulmonary parenchymal opacities. No acute musculoskeletal abnormalities in the thorax.  CT ABDOMEN AND PELVIS FINDINGS  Multiple low-attenuation liver lesions involving both lobes. The largest measures about 2 cm. These are stable when compared to the prior study except for the most inferior, on image number 66 in the right lobe measuring 1 cm, which cannot be compared because the prior study did not images portion of anatomy. These appear consistent with cysts.  The spleen is normal. There is a rounded peritoneal nodule posterior to the inferior tip of the spleen on image number 64 measuring 14 mm. This is new. Gallbladder appears normal. The pancreas itself appears normal, but there is inflammatory change immediately posterior to the pancreatic head in the aortocaval region of the retroperitoneum. This appears related to the presence of aortocaval adenopathy and periaortic adenopathy, with numerous enlarged retroperitoneal lymph nodes in the peripancreatic and aortocaval region, measuring up to about 2 cm in short axis. There is adenopathy and retroperitoneal inflammation extending down to the level of the bifurcation with  enlarged right iliac chain lymph nodes.  Below the level of the renal veins the inferior vena cava is difficult to identified. Iliac veins appear prominent bilaterally.  There is atherosclerotic calcification of the aortoiliac vessels. The bifurcation of the right common iliac artery appears to involve aneurysm at the origin of the internal iliac artery to a diameter of about 3 cm. Right adrenal gland is normal. Left adrenal gland demonstrates a mass measuring 3.1 x 2.2 cm. This mass is new from 2014. Both kidneys are somewhat atrophic with no acute renal abnormalities.  Bladder is normal. Reproductive organs are normal. There is significant diverticulosis throughout the sigmoid colon and to a lesser degree in the more proximal colon. There is no diverticulitis identified. Bladder is normal. Small bowel and stomach are normal.  No acute musculoskeletal findings. Small periumbilical hernia contains loops of small bowel without evidence of strangulation or obstruction.  IMPRESSION: 1. Advanced thoracic adenopathy when compared to the prior study concerning for metastasis. 2. Interval development of left adrenal mass, concerning for metastasis. 3. Retroperitoneal adenopathy in the periaortic and aortocaval region new from prior study consistent with metastasis. This extends along the right iliac chain. 4. There is also a retroperitoneal inflammatory change in the abdomen and pelvis. Both iliac veins are somewhat prominent, but the inferior vena cava is in conspicuous below the level of the renal veins. Possibility of inferior vena cava thrombus not excluded. Consider further evaluation, possibly with lower extremity venous doppler. 5. 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 Peritoneal implant posterior to the inferior spleen concerning for metastatic disease. These results will be called to the ordering clinician or representative by the Radiologist Assistant, and communication documented in the PACS or zVision  Dashboard.   Electronically Signed   By: Skipper Cliche M.D.   On: 10/19/2014 16:03   Ct Abdomen Pelvis W Contrast  10/19/2014   CLINICAL DATA:  Subsequent evaluation for allergic reaction to chemotherapy, rash and fever at the time of reaction with persistent rash currently,  personal history of bladder cancer, chest pain  EXAM: CT CHEST, ABDOMEN, AND PELVIS WITH CONTRAST  TECHNIQUE: Multidetector CT imaging of the chest, abdomen and pelvis was performed following the standard protocol during bolus administration of intravenous contrast.  CONTRAST:  199mL OMNIPAQUE IOHEXOL 300 MG/ML  SOLN  COMPARISON:  Chest CT 08/02/2013  FINDINGS: CT CHEST FINDINGS  Left anterior descending artery stent. Multifocal coronary arterial calcification. Right Port-A-Cath with tip to the cavoatrial junction.  Tiny bilateral pleural effusions with mild bibasilar atelectasis. Minimal pericardial thickening.  Numerous enlarged mediastinal lymph nodes. Pretracheal lymph node shows short axis diameter of 20 mm, larger than it was previously. Numerous superior mediastinal lymph nodes new or enlarged when compared to the prior study, measuring greater than 10 mm in short axis. Sub carinal adenopathy demonstrate short axis diameter of 16 mm. This is new or enlarged when compared to the prior study.  No abnormal pulmonary parenchymal opacities. No acute musculoskeletal abnormalities in the thorax.  CT ABDOMEN AND PELVIS FINDINGS  Multiple low-attenuation liver lesions involving both lobes. The largest measures about 2 cm. These are stable when compared to the prior study except for the most inferior, on image number 66 in the right lobe measuring 1 cm, which cannot be compared because the prior study did not images portion of anatomy. These appear consistent with cysts.  The spleen is normal. There is a rounded peritoneal nodule posterior to the inferior tip of the spleen on image number 64 measuring 14 mm. This is new. Gallbladder appears  normal. The pancreas itself appears normal, but there is inflammatory change immediately posterior to the pancreatic head in the aortocaval region of the retroperitoneum. This appears related to the presence of aortocaval adenopathy and periaortic adenopathy, with numerous enlarged retroperitoneal lymph nodes in the peripancreatic and aortocaval region, measuring up to about 2 cm in short axis. There is adenopathy and retroperitoneal inflammation extending down to the level of the bifurcation with enlarged right iliac chain lymph nodes.  Below the level of the renal veins the inferior vena cava is difficult to identified. Iliac veins appear prominent bilaterally.  There is atherosclerotic calcification of the aortoiliac vessels. The bifurcation of the right common iliac artery appears to involve aneurysm at the origin of the internal iliac artery to a diameter of about 3 cm. Right adrenal gland is normal. Left adrenal gland demonstrates a mass measuring 3.1 x 2.2 cm. This mass is new from 2014. Both kidneys are somewhat atrophic with no acute renal abnormalities.  Bladder is normal. Reproductive organs are normal. There is significant diverticulosis throughout the sigmoid colon and to a lesser degree in the more proximal colon. There is no diverticulitis identified. Bladder is normal. Small bowel and stomach are normal.  No acute musculoskeletal findings. Small periumbilical hernia contains loops of small bowel without evidence of strangulation or obstruction.  IMPRESSION: 1. Advanced thoracic adenopathy when compared to the prior study concerning for metastasis. 2. Interval development of left adrenal mass, concerning for metastasis. 3. Retroperitoneal adenopathy in the periaortic and aortocaval region new from prior study consistent with metastasis. This extends along the right iliac chain. 4. There is also a retroperitoneal inflammatory change in the abdomen and pelvis. Both iliac veins are somewhat prominent,  but the inferior vena cava is in conspicuous below the level of the renal veins. Possibility of inferior vena cava thrombus not excluded. Consider further evaluation, possibly with lower extremity venous doppler. 5. 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 Peritoneal implant  posterior to the inferior spleen concerning for metastatic disease. These results will be called to the ordering clinician or representative by the Radiologist Assistant, and communication documented in the PACS or zVision Dashboard.   Electronically Signed   By: Skipper Cliche M.D.   On: 10/19/2014 16:03    Scheduled Meds: . aspirin EC  81 mg Oral Daily  . atorvastatin  40 mg Oral q1800  . enoxaparin (LOVENOX) injection  1 mg/kg Subcutaneous BID  . guaiFENesin  600 mg Oral BID  . morphine  15 mg Oral Q12H  . predniSONE  10 mg Oral Q breakfast  . sertraline  150 mg Oral Daily  . venlafaxine XR  150 mg Oral Daily   Continuous Infusions:    Time spent: 25 minutes    Sanaya Gwilliam  [facility] Pager [phone]. If 7PM-7AM, please contact night-coverage at [email or web address], password [facility] 10/19/2014, 8:04 PM  LOS: 6 days

## 2014-10-19 NOTE — Progress Notes (Addendum)
CSW continuing to follow.   CSW received notification from [facility] that she met with pt, wife and son at bedside for DC planning. Pt wants to go back home with [facility]. CSW provided SNF bed offers to [facility] who provided to wife in case they decide that pt needs too much care at home and needs to go to snf.  No further social work needs identified at this time as plan is to return home with home health services and family support.   CSW signing off.   Please re-consult if social work needs arise.   Alison Murray, MSW, [place] [facility] [phone]

## 2014-10-20 DIAGNOSIS — M79609 Pain in unspecified limb: Secondary | ICD-10-CM

## 2014-10-20 LAB — CBC WITH DIFFERENTIAL/PLATELET
Basophils Absolute: 0 10*3/uL (ref 0.0–0.1)
Basophils Relative: 1 % (ref 0–1)
EOS PCT: 0 % (ref 0–5)
Eosinophils Absolute: 0 10*3/uL (ref 0.0–0.7)
HEMATOCRIT: 29.4 % — AB (ref 39.0–52.0)
Hemoglobin: 9.7 g/dL — ABNORMAL LOW (ref 13.0–17.0)
Lymphocytes Relative: 54 % — ABNORMAL HIGH (ref 12–46)
Lymphs Abs: 1 10*3/uL (ref 0.7–4.0)
MCH: 31.9 pg (ref 26.0–34.0)
MCHC: 33 g/dL (ref 30.0–36.0)
MCV: 96.7 fL (ref 78.0–100.0)
Monocytes Absolute: 0.4 10*3/uL (ref 0.1–1.0)
Monocytes Relative: 19 % — ABNORMAL HIGH (ref 3–12)
NEUTROS PCT: 26 % — AB (ref 43–77)
Neutro Abs: 0.5 10*3/uL — ABNORMAL LOW (ref 1.7–7.7)
Platelets: 150 10*3/uL (ref 150–400)
RBC: 3.04 MIL/uL — AB (ref 4.22–5.81)
RDW: 16.8 % — ABNORMAL HIGH (ref 11.5–15.5)
WBC: 1.9 10*3/uL — ABNORMAL LOW (ref 4.0–10.5)

## 2014-10-20 LAB — COMPREHENSIVE METABOLIC PANEL
ALBUMIN: 3 g/dL — AB (ref 3.5–5.2)
ALT: 35 U/L (ref 0–53)
AST: 43 U/L — ABNORMAL HIGH (ref 0–37)
Alkaline Phosphatase: 45 U/L (ref 39–117)
Anion gap: 6 (ref 5–15)
BUN: 23 mg/dL (ref 6–23)
CHLORIDE: 104 mmol/L (ref 96–112)
CO2: 25 mmol/L (ref 19–32)
Calcium: 8.2 mg/dL — ABNORMAL LOW (ref 8.4–10.5)
Creatinine, Ser: 1.31 mg/dL (ref 0.50–1.35)
GFR calc Af Amer: 59 mL/min — ABNORMAL LOW (ref >90)
GFR calc non Af Amer: 51 mL/min — ABNORMAL LOW (ref >90)
Glucose, Bld: 106 mg/dL — ABNORMAL HIGH (ref 70–99)
Potassium: 4.6 mmol/L (ref 3.5–5.1)
Sodium: 135 mmol/L (ref 135–145)
TOTAL PROTEIN: 6 g/dL (ref 6.0–8.3)
Total Bilirubin: 1.1 mg/dL (ref 0.3–1.2)

## 2014-10-20 LAB — CULTURE, BLOOD (ROUTINE X 2)
CULTURE: NO GROWTH
Culture: NO GROWTH

## 2014-10-20 LAB — PROTIME-INR
INR: 1.04 (ref 0.00–1.49)
Prothrombin Time: 13.7 seconds (ref 11.6–15.2)

## 2014-10-20 MED ORDER — ENOXAPARIN SODIUM 60 MG/0.6ML ~~LOC~~ SOLN
60.0000 mg | Freq: Every day | SUBCUTANEOUS | Status: DC
  Filled 2014-10-20: qty 0.6

## 2014-10-20 MED ORDER — HYDROCODONE-ACETAMINOPHEN 5-325 MG PO TABS: 2.0000 | ORAL_TABLET | ORAL | Status: DC | PRN

## 2014-10-20 MED ORDER — MORPHINE SULFATE ER 15 MG PO TBCR: 15.0000 mg | EXTENDED_RELEASE_TABLET | Freq: Two times a day (BID) | ORAL | Status: DC

## 2014-10-20 MED ORDER — HEPARIN SOD (PORK) LOCK FLUSH 100 UNIT/ML IV SOLN
500.0000 [IU] | Freq: Once | INTRAVENOUS | Status: AC
  Administered 2014-10-20: 500 [IU] via INTRAVENOUS
  Filled 2014-10-20: qty 5

## 2014-10-20 NOTE — Plan of Care (Signed)
Problem: Phase II Progression Outcomes Goal: Progress activity as tolerated unless otherwise ordered Outcome: Progressing Ambulated with PT and oob in chair today

## 2014-10-20 NOTE — Progress Notes (Signed)
Have paged vascular lab pager # [phone]  [phone]) during the night without any return call. Dopplers still pending. Should be done in am.

## 2014-10-20 NOTE — Discharge Summary (Signed)
James [name], is a 79 y.o. male  DOB 12/18/1935  MRN [id number].  Admission date:  10/13/2014  Admitting Physician  Toy Baker, MD  Discharge Date:  10/20/2014   Primary MD  Dione Plover, MD  Recommendations for primary care physician for things to follow:   If fever, consider longer course of antibiotics for retroperitoneal infection.   If persistent leg pains, consider MRI spine.   Admission Diagnosis   FUO (fever of unknown origin) [R50.9] SIRS (systemic inflammatory response syndrome) [A41.9]   Discharge Diagnosis  FUO (fever of unknown origin) [R50.9] SIRS (systemic inflammatory response syndrome) [A41.9]   Active Problems:   Metastasis from bladder cancer   Leukopenia   Palliative care encounter   Pain, generalized   Coronary atherosclerosis of native coronary artery   Obstructive sleep apnea   DNR (do not resuscitate) discussion   Leg pain      Hospital Course  James [name] is a very pleasant 79 year old gentleman with metastatic bladder cancer receiving his care at [facility], recently started on Taxol therapy and admitted to the medicine service on 10/13/2014 after presenting with temperature of 103F. He had been hospitalized at [facility] on 08/28/2014, discharged 09/04/2014 and underwent workup for fever of unknown origin. During that hospitalization he received empiric IV and microbial therapy with vancomycin and cefepime. He underwent an extensive workup which included lumbar puncture and source of infection was not found. During that hospitalization he was seen by Dr. Megan Salon of infectious disease. Initial lab work performed at this facility this time around has not shown obvious source of infection as urinalysis, chest x-ray, have been unremarkable. He  received 4 days of broad spectrum IV antimicrobial therapy with Vancomycin and Zosyn during this hospital stay, and did not spike any more fevers after the antibiotics were stopped per ID recommendation-Dr Hatcher. Cultures remained negative. His IV antimicrobials were stopped on 10/16/2014 and followed off of antibiotics. The thought is that Fever may have been related to viral syndrome or recent chemotherapy. On a different note, his family expressed wishes to consolidate care and transfer his oncology care from [place] to the [facility]. Dr Lindi Adie of medical oncology was consulted and agreed to assume oncology care. Family also expressed interest in palliative care which was donel. Patient initially opted for DNR status, but he and his family(wife and son who is a Teacher, music) feel that he made that decision thinking he was not going to make it and they would like for him to be full code at this time. Patient continued to complain of lower extremity pain on 10/19/14 whose etiology was not clear, and this prompted CT chest and abdomen with contrast which showed, "1. Advanced thoracic adenopathy when compared to the prior study concerning for metastasis. 2. Interval development of left adrenal mass, concerning for metastasis. 3. Retroperitoneal adenopathy in the periaortic and aortocaval region new from prior study consistent with metastasis. This extends along the right iliac chain. 4. There is also a retroperitoneal  inflammatory change in the abdomen and pelvis. Both iliac veins are somewhat prominent, but the inferior vena cava is in conspicuous below the level of the renal veins. Possibility of inferior vena cava thrombus not excluded. Consider further evaluation, possibly with lower extremity venous doppler. 5. 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 Peritoneal implant posterior to the inferior spleen concerning for metastatic disease". I reviewed CT abdomen and pelvis report done at [facility] on 09/20/2014  and it seems like the major change was the question of IVC thrombus which resulted in venous Doppler of the lower extremities. There was no evidence of DVT in both legs. If patient has persistent lower extremity pain outside the window of chemotherapy, would consider imaging the spinal cord in view of metastatic cancer and possibility of spinal cord compression which is not apparent at present. If he develops persistent fever, would consider a longer course of empiric antibiotics for retroperitoneal soft tissue infection. I have given him MS Contin/Vicodin for pain and this will need to be reevaluated at the next PCP visit. Patient is discharged with home health services. He has requested to continue condom catheter as this is helping him sleep.  Discharge Condition Stable.  Consults obtained  Oncology ID  Follow UP  PCP Oncology Cardiology   Discharge Instructions  and  Discharge Medications  Discharge Instructions    Diet - low sodium heart healthy    Complete by:  As directed      Increase activity slowly    Complete by:  As directed             Medication List    STOP taking these medications        OVER THE COUNTER MEDICATION     TAXOL IV      TAKE these medications        acetaminophen 500 MG tablet  Commonly known as:  TYLENOL  Take 500 mg by mouth every 6 (six) hours as needed for fever.     antiseptic oral rinse Liqd  15 mLs by Mouth Rinse route as needed for dry mouth.     aspirin 81 MG tablet  Take 81 mg by mouth daily.     atorvastatin 40 MG tablet  Commonly known as:  LIPITOR  TAKE 1 TABLET (40 MG TOTAL) BY MOUTH DAILY.     BENADRYL IJ  Inject 1 each as directed once.     calcium carbonate 1250 (500 CA) MG tablet  Commonly known as:  OS-CAL - dosed in mg of elemental calcium  Take 1 tablet (500 mg of elemental calcium total) by mouth 2 (two) times daily with a meal.     carvedilol 6.25 MG tablet  Commonly known as:  COREG  TAKE 1 TABLET (6.25 MG  TOTAL) BY MOUTH 2 (TWO) TIMES DAILY.     DECADRON PO  Take 1 tablet by mouth daily as needed (after chemo treatment).     ferrous sulfate 325 (65 FE) MG tablet  Take 325 mg by mouth daily.     Garlic 505 MG Caps  Take 300 mg by mouth daily.     HYDROcodone-acetaminophen 5-325 MG per tablet  Commonly known as:  NORCO/VICODIN  Take 2 tablets by mouth every 4 (four) hours as needed for moderate pain.     lidocaine-prilocaine cream  Commonly known as:  EMLA  Apply topically as needed for port     loperamide 2 MG tablet  Commonly known as:  IMODIUM A-D  Take 2 mg by mouth 3 (three) times daily as needed for diarrhea or loose stools.     LORazepam 1 MG tablet  Commonly known as:  ATIVAN  Take 1 tablet (1 mg total) by mouth every 12 (twelve) hours as needed for anxiety.     morphine 15 MG 12 hr tablet  Commonly known as:  MS CONTIN  Take 1 tablet (15 mg total) by mouth every 12 (twelve) hours.     MULTIPLE VITAMIN PO  Take by mouth daily.     NETTLE LEAF PO  Take 300 mg by mouth daily.     nitroGLYCERIN 0.4 MG SL tablet  Commonly known as:  NITROSTAT  Place 0.4 mg under the tongue every 5 (five) minutes as needed for chest pain.     nystatin cream  Commonly known as:  MYCOSTATIN  Apply 1 application topically daily as needed for dry skin.     OMEGA 3 PO  Take 1 capsule by mouth 2 (two) times daily. [phone] mg     predniSONE 5 MG tablet  Commonly known as:  DELTASONE  Take 1 tablet (5 mg total) by mouth daily with breakfast.     PROBIOTIC & ACIDOPHILUS EX ST PO  Take by mouth daily.     prochlorperazine 10 MG tablet  Commonly known as:  COMPAZINE  Take 10 mg by mouth every 6 (six) hours as needed for nausea or vomiting.     sertraline 100 MG tablet  Commonly known as:  ZOLOFT  Take 150 mg by mouth daily.     traMADol 50 MG tablet  Commonly known as:  ULTRAM  Take 1 tablet (50 mg total) by mouth every 6 (six) hours as needed for moderate pain.      triamcinolone cream 0.1 %  Commonly known as:  KENALOG  Apply 1 application topically daily as needed (for itching).     venlafaxine XR 150 MG 24 hr capsule  Commonly known as:  EFFEXOR-XR  Take 150 mg by mouth daily.        Diet and Activity recommendation: See Discharge Instructions above  Major procedures and Radiology Reports - PLEASE review detailed and final reports for all details, in brief -    Dg Chest 2 View  10/16/2014   CLINICAL DATA:  Fever 103. Acute kidney injury. No obvious source of infection.  EXAM: CHEST  2 VIEW  COMPARISON:  10/13/2014  FINDINGS: Right-sided PowerPort tip overlies the superior vena cava. Heart size is upper normal. No focal consolidations. There are small bilateral pleural effusions. Degenerative changes are seen in the mid thoracic spine.  IMPRESSION: Small bilateral pleural effusions.  No consolidations.   Electronically Signed   By: Nolon Nations M.D.   On: 10/16/2014 17:12   Ct Chest W Contrast  10/19/2014   CLINICAL DATA:  Subsequent evaluation for allergic reaction to chemotherapy, rash and fever at the time of reaction with persistent rash currently, personal history of bladder cancer, chest pain  EXAM: CT CHEST, ABDOMEN, AND PELVIS WITH CONTRAST  TECHNIQUE: Multidetector CT imaging of the chest, abdomen and pelvis was performed following the standard protocol during bolus administration of intravenous contrast.  CONTRAST:  112mL OMNIPAQUE IOHEXOL 300 MG/ML  SOLN  COMPARISON:  Chest CT 08/02/2013  FINDINGS: CT CHEST FINDINGS  Left anterior descending artery stent. Multifocal coronary arterial calcification. Right Port-A-Cath with tip to the cavoatrial junction.  Tiny bilateral pleural effusions with mild bibasilar atelectasis. Minimal pericardial thickening.  Numerous enlarged  mediastinal lymph nodes. Pretracheal lymph node shows short axis diameter of 20 mm, larger than it was previously. Numerous superior mediastinal lymph nodes new or enlarged when  compared to the prior study, measuring greater than 10 mm in short axis. Sub carinal adenopathy demonstrate short axis diameter of 16 mm. This is new or enlarged when compared to the prior study.  No abnormal pulmonary parenchymal opacities. No acute musculoskeletal abnormalities in the thorax.  CT ABDOMEN AND PELVIS FINDINGS  Multiple low-attenuation liver lesions involving both lobes. The largest measures about 2 cm. These are stable when compared to the prior study except for the most inferior, on image number 66 in the right lobe measuring 1 cm, which cannot be compared because the prior study did not images portion of anatomy. These appear consistent with cysts.  The spleen is normal. There is a rounded peritoneal nodule posterior to the inferior tip of the spleen on image number 64 measuring 14 mm. This is new. Gallbladder appears normal. The pancreas itself appears normal, but there is inflammatory change immediately posterior to the pancreatic head in the aortocaval region of the retroperitoneum. This appears related to the presence of aortocaval adenopathy and periaortic adenopathy, with numerous enlarged retroperitoneal lymph nodes in the peripancreatic and aortocaval region, measuring up to about 2 cm in short axis. There is adenopathy and retroperitoneal inflammation extending down to the level of the bifurcation with enlarged right iliac chain lymph nodes.  Below the level of the renal veins the inferior vena cava is difficult to identified. Iliac veins appear prominent bilaterally.  There is atherosclerotic calcification of the aortoiliac vessels. The bifurcation of the right common iliac artery appears to involve aneurysm at the origin of the internal iliac artery to a diameter of about 3 cm. Right adrenal gland is normal. Left adrenal gland demonstrates a mass measuring 3.1 x 2.2 cm. This mass is new from 2014. Both kidneys are somewhat atrophic with no acute renal abnormalities.  Bladder is normal.  Reproductive organs are normal. There is significant diverticulosis throughout the sigmoid colon and to a lesser degree in the more proximal colon. There is no diverticulitis identified. Bladder is normal. Small bowel and stomach are normal.  No acute musculoskeletal findings. Small periumbilical hernia contains loops of small bowel without evidence of strangulation or obstruction.  IMPRESSION: 1. Advanced thoracic adenopathy when compared to the prior study concerning for metastasis. 2. Interval development of left adrenal mass, concerning for metastasis. 3. Retroperitoneal adenopathy in the periaortic and aortocaval region new from prior study consistent with metastasis. This extends along the right iliac chain. 4. There is also a retroperitoneal inflammatory change in the abdomen and pelvis. Both iliac veins are somewhat prominent, but the inferior vena cava is in conspicuous below the level of the renal veins. Possibility of inferior vena cava thrombus not excluded. Consider further evaluation, possibly with lower extremity venous doppler. 5. 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 Peritoneal implant posterior to the inferior spleen concerning for metastatic disease. These results will be called to the ordering clinician or representative by the Radiologist Assistant, and communication documented in the PACS or zVision Dashboard.   Electronically Signed   By: Skipper Cliche M.D.   On: 10/19/2014 16:03   Ct Abdomen Pelvis W Contrast  10/19/2014   CLINICAL DATA:  Subsequent evaluation for allergic reaction to chemotherapy, rash and fever at the time of reaction with persistent rash currently, personal history of bladder cancer, chest pain  EXAM: CT CHEST,  ABDOMEN, AND PELVIS WITH CONTRAST  TECHNIQUE: Multidetector CT imaging of the chest, abdomen and pelvis was performed following the standard protocol during bolus administration of intravenous contrast.  CONTRAST:  141mL OMNIPAQUE IOHEXOL 300 MG/ML   SOLN  COMPARISON:  Chest CT 08/02/2013  FINDINGS: CT CHEST FINDINGS  Left anterior descending artery stent. Multifocal coronary arterial calcification. Right Port-A-Cath with tip to the cavoatrial junction.  Tiny bilateral pleural effusions with mild bibasilar atelectasis. Minimal pericardial thickening.  Numerous enlarged mediastinal lymph nodes. Pretracheal lymph node shows short axis diameter of 20 mm, larger than it was previously. Numerous superior mediastinal lymph nodes new or enlarged when compared to the prior study, measuring greater than 10 mm in short axis. Sub carinal adenopathy demonstrate short axis diameter of 16 mm. This is new or enlarged when compared to the prior study.  No abnormal pulmonary parenchymal opacities. No acute musculoskeletal abnormalities in the thorax.  CT ABDOMEN AND PELVIS FINDINGS  Multiple low-attenuation liver lesions involving both lobes. The largest measures about 2 cm. These are stable when compared to the prior study except for the most inferior, on image number 66 in the right lobe measuring 1 cm, which cannot be compared because the prior study did not images portion of anatomy. These appear consistent with cysts.  The spleen is normal. There is a rounded peritoneal nodule posterior to the inferior tip of the spleen on image number 64 measuring 14 mm. This is new. Gallbladder appears normal. The pancreas itself appears normal, but there is inflammatory change immediately posterior to the pancreatic head in the aortocaval region of the retroperitoneum. This appears related to the presence of aortocaval adenopathy and periaortic adenopathy, with numerous enlarged retroperitoneal lymph nodes in the peripancreatic and aortocaval region, measuring up to about 2 cm in short axis. There is adenopathy and retroperitoneal inflammation extending down to the level of the bifurcation with enlarged right iliac chain lymph nodes.  Below the level of the renal veins the inferior vena  cava is difficult to identified. Iliac veins appear prominent bilaterally.  There is atherosclerotic calcification of the aortoiliac vessels. The bifurcation of the right common iliac artery appears to involve aneurysm at the origin of the internal iliac artery to a diameter of about 3 cm. Right adrenal gland is normal. Left adrenal gland demonstrates a mass measuring 3.1 x 2.2 cm. This mass is new from 2014. Both kidneys are somewhat atrophic with no acute renal abnormalities.  Bladder is normal. Reproductive organs are normal. There is significant diverticulosis throughout the sigmoid colon and to a lesser degree in the more proximal colon. There is no diverticulitis identified. Bladder is normal. Small bowel and stomach are normal.  No acute musculoskeletal findings. Small periumbilical hernia contains loops of small bowel without evidence of strangulation or obstruction.  IMPRESSION: 1. Advanced thoracic adenopathy when compared to the prior study concerning for metastasis. 2. Interval development of left adrenal mass, concerning for metastasis. 3. Retroperitoneal adenopathy in the periaortic and aortocaval region new from prior study consistent with metastasis. This extends along the right iliac chain. 4. There is also a retroperitoneal inflammatory change in the abdomen and pelvis. Both iliac veins are somewhat prominent, but the inferior vena cava is in conspicuous below the level of the renal veins. Possibility of inferior vena cava thrombus not excluded. Consider further evaluation, possibly with lower extremity venous doppler. 5. 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 Peritoneal implant posterior to the inferior spleen concerning for metastatic disease. These results  will be called to the ordering clinician or representative by the Radiologist Assistant, and communication documented in the PACS or zVision Dashboard.   Electronically Signed   By: Skipper Cliche M.D.   On: 10/19/2014 16:03   Dg  Chest Port 1 View  10/13/2014   CLINICAL DATA:  Productive cough. Fever. Undergoing chemotherapy for bladder carcinoma.  EXAM: PORTABLE CHEST - 1 VIEW  COMPARISON:  08/28/2014  FINDINGS: The heart size and mediastinal contours are within normal limits. Both lungs are clear. Previously seen infiltrate in left lung base has resolved. No evidence of pleural effusion. Power port remains in appropriate position.  IMPRESSION: No active disease.   Electronically Signed   By: Earle Gell M.D.   On: 10/13/2014 19:42    Micro Results   Recent Results (from the past 240 hour(s))  Culture, blood (routine x 2)     Status: None   Collection Time: 10/13/14  6:03 PM  Result Value Ref Range Status   Specimen Description BLOOD RIGHT ARM  Final   Special Requests BOTTLES DRAWN AEROBIC AND ANAEROBIC 5CC  Final   Culture   Final    NO GROWTH 5 DAYS Performed at [facility]    Report Status 10/20/2014 FINAL  Final  Culture, blood (routine x 2)     Status: None   Collection Time: 10/13/14  6:23 PM  Result Value Ref Range Status   Specimen Description BLOOD  Final   Special Requests BOTTLES DRAWN AEROBIC ONLY  Final   Culture   Final    NO GROWTH 5 DAYS Performed at [facility]    Report Status 10/20/2014 FINAL  Final  Urine culture     Status: None   Collection Time: 10/13/14  8:53 PM  Result Value Ref Range Status   Specimen Description URINE, CLEAN CATCH  Final   Special Requests NONE  Final   Colony Count NO GROWTH Performed at [facility]   Final   Culture NO GROWTH Performed at [facility]   Final   Report Status 10/15/2014 FINAL  Final  Respiratory virus panel (routine influenza)     Status: None   Collection Time: 10/14/14  4:40 AM  Result Value Ref Range Status   Source - RVPAN NOSE  Corrected   Respiratory Syncytial Virus A Negative Negative Final   Respiratory Syncytial Virus B Negative Negative Final   Influenza A Negative Negative Final    Influenza B Negative Negative Final   Parainfluenza 1 Negative Negative Final   Parainfluenza 2 Negative Negative Final   Parainfluenza 3 Negative Negative Final   Metapneumovirus Negative Negative Final   Rhinovirus Negative Negative Final   Adenovirus Negative Negative Final    Comment: (NOTE) Performed At: [facility] [place], [place] [id number] Lindon Romp MD RJ:[id number]   Culture, expectorated sputum-assessment     Status: None   Collection Time: 10/14/14  1:54 PM  Result Value Ref Range Status   Specimen Description SPUTUM  Final   Special Requests Immunocompromised  Final   Sputum evaluation   Final    THIS SPECIMEN IS ACCEPTABLE. RESPIRATORY CULTURE REPORT TO FOLLOW.   Report Status 10/14/2014 FINAL  Final  Culture, respiratory (NON-Expectorated)     Status: None   Collection Time: 10/14/14  1:54 PM  Result Value Ref Range Status   Specimen Description SPUTUM  Final   Special Requests NONE  Final   Gram Stain   Final  RARE WBC PRESENT, PREDOMINANTLY PMN RARE SQUAMOUS EPITHELIAL CELLS PRESENT RARE YEAST RARE GRAM POSITIVE COCCI IN PAIRS RARE GRAM NEGATIVE RODS    Culture   Final    NORMAL OROPHARYNGEAL FLORA Performed at [facility]    Report Status 10/17/2014 FINAL  Final       Today   Subjective:   James [name] today has no headache,no chest abdominal pain,no new weakness tingling or numbness, feels much better wants to go home today.   Objective:   Blood pressure 119/65, pulse 67, temperature 98.3 F (36.8 C), temperature source Oral, resp. rate 20, height 5\' 9"  (1.753 m), weight 114.397 kg (252 lb 3.2 oz), SpO2 100 %.   Intake/Output Summary (Last 24 hours) at 10/20/14 1235 Last data filed at 10/20/14 1206  Gross per 24 hour  Intake   1090 ml  Output   2675 ml  Net  -1585 ml    Exam Awake Alert, Oriented x 3, No new F.N deficits, Normal affect [place].AT,PERRAL Supple Neck,No JVD, No cervical  lymphadenopathy appriciated.  Symmetrical Chest wall movement, Good air movement bilaterally, CTAB RRR,No Gallops,Rubs or new Murmurs, No Parasternal Heave +ve B.Sounds, Abd Soft, Non tender, No organomegaly appriciated, No rebound -guarding or rigidity. No Cyanosis, Clubbing or edema, No new Rash or bruise  Data Review   CBC w Diff: Lab Results  Component Value Date   WBC 1.9* 10/20/2014   HGB 9.7* 10/20/2014   HCT 29.4* 10/20/2014   PLT 150 10/20/2014   LYMPHOPCT 54* 10/20/2014   MONOPCT 19* 10/20/2014   EOSPCT 0 10/20/2014   BASOPCT 1 10/20/2014    CMP: Lab Results  Component Value Date   NA 135 10/20/2014   K 4.6 10/20/2014   CL 104 10/20/2014   CO2 25 10/20/2014   BUN 23 10/20/2014   CREATININE 1.31 10/20/2014   PROT 6.0 10/20/2014   ALBUMIN 3.0* 10/20/2014   BILITOT 1.1 10/20/2014   ALKPHOS 45 10/20/2014   AST 43* 10/20/2014   ALT 35 10/20/2014  .   Total Time in preparing paper work, data evaluation and todays exam - 35 minutes  James [name] M.D on 10/20/2014 at 12:35 PM  [facility] Office  [phone]

## 2014-10-20 NOTE — Progress Notes (Signed)
Physical Therapy Treatment Patient Details Name: James [name] MRN: [id number] DOB: [birth year] Today's Date: 10/20/2014    History of Present Illness 79 y.o. male with history of CAD status post stenting, atrial flutter status post ablation, metastatic bladder carcinoma, OSA on CPap, dyslipidemia admitted for SIRS, fever, and acute kidney injury    PT Comments    B LE Dopplers are neg.  Assisted pt OOB to amb a full unit 350 feet with RW at Supervision level.  No rest break and no c/o's.  "feeling stronger every day".  Pt plans to D/C to home today with family support.  Follow Up Recommendations  Home health PT     Equipment Recommendations  Other (comment) (Bariatric Rollator (4 [employer] with seat))    Recommendations for Other Services       Precautions / Restrictions Precautions Precautions: Fall Restrictions Weight Bearing Restrictions: No    Mobility  Bed Mobility Overal bed mobility: Modified Independent             General bed mobility comments: increased time  Transfers Overall transfer level: Needs assistance Equipment used: Rolling walker (2 wheeled) Transfers: Sit to/from Stand Sit to Stand: Supervision         General transfer comment: good safety cognition and tech  Ambulation/Gait Ambulation/Gait assistance: Supervision Ambulation Distance (Feet): 350 Feet (no breaks) Assistive device: Rolling walker (2 wheeled) Gait Pattern/deviations: Step-through pattern Gait velocity: WFL   General Gait Details: tolareated full lap with no rest breaks.     Stairs            Wheelchair Mobility    Modified Rankin (Stroke Patients Only)       Balance                                    Cognition Arousal/Alertness: Awake/alert Behavior During Therapy: WFL for tasks assessed/performed Overall Cognitive Status: Within Functional Limits for tasks assessed                      Exercises      General Comments         Pertinent Vitals/Pain Pain Assessment: No/denies pain    Home Living                      Prior Function            PT Goals (current goals can now be found in the care plan section) Progress towards PT goals: Progressing toward goals    Frequency  Min 3X/week    PT Plan      Co-evaluation             End of Session Equipment Utilized During Treatment: Gait belt Activity Tolerance: Patient tolerated treatment well Patient left: in chair;with call bell/phone within reach;with family/visitor present     Time: 1047-1105 PT Time Calculation (min) (ACUTE ONLY): 18 min  Charges:  $Gait Training: 8-22 mins                    G Codes:      Rica Koyanagi  PTA [facility]  Acute  Rehab Pager      [phone]

## 2014-10-20 NOTE — Progress Notes (Signed)
*  Preliminary Results* Bilateral lower extremity venous duplex completed. Bilateral lower extremities are negative for deep vein thrombosis. There is no evidence of Baker's cyst bilaterally.  10/20/2014  Maudry Mayhew, RVT, RDCS, RDMS

## 2014-10-24 ENCOUNTER — Telehealth: Payer: Self-pay | Admitting: Hematology and Oncology

## 2014-10-24 ENCOUNTER — Telehealth: Payer: Self-pay | Admitting: *Deleted

## 2014-10-24 ENCOUNTER — Other Ambulatory Visit: Payer: Self-pay | Admitting: *Deleted

## 2014-10-24 ENCOUNTER — Encounter: Payer: Self-pay | Admitting: *Deleted

## 2014-10-24 ENCOUNTER — Encounter: Payer: Self-pay | Admitting: Hematology and Oncology

## 2014-10-24 ENCOUNTER — Other Ambulatory Visit (HOSPITAL_BASED_OUTPATIENT_CLINIC_OR_DEPARTMENT_OTHER): Payer: Medicare Other

## 2014-10-24 DIAGNOSIS — C679 Malignant neoplasm of bladder, unspecified: Secondary | ICD-10-CM

## 2014-10-24 DIAGNOSIS — D709 Neutropenia, unspecified: Secondary | ICD-10-CM

## 2014-10-24 DIAGNOSIS — C779 Secondary and unspecified malignant neoplasm of lymph node, unspecified: Secondary | ICD-10-CM

## 2014-10-24 LAB — COMPREHENSIVE METABOLIC PANEL (CC13)
ALT: 55 U/L (ref 0–55)
ANION GAP: 9 meq/L (ref 3–11)
AST: 39 U/L — AB (ref 5–34)
Albumin: 3.2 g/dL — ABNORMAL LOW (ref 3.5–5.0)
Alkaline Phosphatase: 65 U/L (ref 40–150)
BUN: 16 mg/dL (ref 7.0–26.0)
CALCIUM: 8.4 mg/dL (ref 8.4–10.4)
CHLORIDE: 103 meq/L (ref 98–109)
CO2: 24 mEq/L (ref 22–29)
CREATININE: 1.1 mg/dL (ref 0.7–1.3)
EGFR: 64 mL/min/{1.73_m2} — ABNORMAL LOW (ref >90)
GLUCOSE: 109 mg/dL (ref 70–140)
Potassium: 4.3 mEq/L (ref 3.5–5.1)
Sodium: 137 mEq/L (ref 136–145)
Total Bilirubin: 0.48 mg/dL (ref 0.20–1.20)
Total Protein: 6.7 g/dL (ref 6.4–8.3)

## 2014-10-24 LAB — CBC WITH DIFFERENTIAL/PLATELET
BASO%: 0.5 % (ref 0.0–2.0)
Basophils Absolute: 0 10*3/uL (ref 0.0–0.1)
EOS%: 0 % (ref 0.0–7.0)
Eosinophils Absolute: 0 10*3/uL (ref 0.0–0.5)
HCT: 31.1 % — ABNORMAL LOW (ref 38.4–49.9)
HGB: 10.2 g/dL — ABNORMAL LOW (ref 13.0–17.1)
LYMPH%: 34.6 % (ref 14.0–49.0)
MCH: 31.7 pg (ref 27.2–33.4)
MCHC: 32.8 g/dL (ref 32.0–36.0)
MCV: 96.6 fL (ref 79.3–98.0)
MONO#: 0.7 10*3/uL (ref 0.1–0.9)
MONO%: 38.4 % — AB (ref 0.0–14.0)
NEUT#: 0.5 10*3/uL — CL (ref 1.5–6.5)
NEUT%: 26.5 % — ABNORMAL LOW (ref 39.0–75.0)
Platelets: 193 10*3/uL (ref 140–400)
RBC: 3.22 10*6/uL — AB (ref 4.20–5.82)
RDW: 16.6 % — AB (ref 11.0–14.6)
WBC: 1.9 10*3/uL — ABNORMAL LOW (ref 4.0–10.3)
lymph#: 0.6 10*3/uL — ABNORMAL LOW (ref 0.9–3.3)

## 2014-10-24 LAB — HOLD TUBE, BLOOD BANK

## 2014-10-24 LAB — TECHNOLOGIST REVIEW

## 2014-10-24 NOTE — Telephone Encounter (Signed)
Call received from Dr. Pennie Banter office about mutual patient. Patient's wife has called their office requesting lab work be sent to oncologist Dr. Lindi Adie.  On 10-23-2014 [place] = [phone].  Results faxed to [phone].  Patient here now for lab draw.

## 2014-10-24 NOTE — Telephone Encounter (Signed)
appts made and called pt with lab appt  anne

## 2014-10-24 NOTE — Progress Notes (Signed)
Patient here to check labs. Results discussed with patient and patient's wife. No need for any transfusion. Will recheck labs in one week.

## 2014-10-24 NOTE — Telephone Encounter (Signed)
Discussed with Dr. Lindi Adie. pof sent to schedule lab appt for today. Patient has appt for 3:15 today.

## 2014-10-24 NOTE — Telephone Encounter (Signed)
Patient's caregiver called to say she is concerned that he might need to have blood work done to see if he needs platelets or a blood transfusion.  James [name] received chemotherapy at [facility] on 10/11/14 and was hospitalized here from 10/13/14 to 10/20/14.  She said he is very weak and she is concerned.  Please advise.

## 2014-10-24 NOTE — Telephone Encounter (Signed)
per 3/15 pof added lab for today. left message for pt on both home/cell regarding appt.

## 2014-10-30 ENCOUNTER — Telehealth: Payer: Self-pay

## 2014-10-30 ENCOUNTER — Other Ambulatory Visit (HOSPITAL_BASED_OUTPATIENT_CLINIC_OR_DEPARTMENT_OTHER): Payer: Medicare Other

## 2014-10-30 DIAGNOSIS — C679 Malignant neoplasm of bladder, unspecified: Secondary | ICD-10-CM

## 2014-10-30 DIAGNOSIS — C799 Secondary malignant neoplasm of unspecified site: Secondary | ICD-10-CM

## 2014-10-30 LAB — CBC WITH DIFFERENTIAL/PLATELET
BASO%: 0.4 % (ref 0.0–2.0)
Basophils Absolute: 0 10*3/uL (ref 0.0–0.1)
EOS%: 0 % (ref 0.0–7.0)
Eosinophils Absolute: 0 10*3/uL (ref 0.0–0.5)
HCT: 33.3 % — ABNORMAL LOW (ref 38.4–49.9)
HGB: 10.6 g/dL — ABNORMAL LOW (ref 13.0–17.1)
LYMPH%: 15.1 % (ref 14.0–49.0)
MCH: 30.5 pg (ref 27.2–33.4)
MCHC: 32 g/dL (ref 32.0–36.0)
MCV: 95.6 fL (ref 79.3–98.0)
MONO#: 1 10*3/uL — ABNORMAL HIGH (ref 0.1–0.9)
MONO%: 16.2 % — AB (ref 0.0–14.0)
NEUT#: 4.4 10*3/uL (ref 1.5–6.5)
NEUT%: 68.3 % (ref 39.0–75.0)
PLATELETS: 255 10*3/uL (ref 140–400)
RBC: 3.48 10*6/uL — ABNORMAL LOW (ref 4.20–5.82)
RDW: 18.3 % — AB (ref 11.0–14.6)
WBC: 6.4 10*3/uL (ref 4.0–10.3)
lymph#: 1 10*3/uL (ref 0.9–3.3)

## 2014-10-30 LAB — COMPREHENSIVE METABOLIC PANEL (CC13)
ALK PHOS: 68 U/L (ref 40–150)
ALT: 37 U/L (ref 0–55)
AST: 26 U/L (ref 5–34)
Albumin: 3.2 g/dL — ABNORMAL LOW (ref 3.5–5.0)
Anion Gap: 10 mEq/L (ref 3–11)
BUN: 12.4 mg/dL (ref 7.0–26.0)
CO2: 22 mEq/L (ref 22–29)
CREATININE: 1.2 mg/dL (ref 0.7–1.3)
Calcium: 8.6 mg/dL (ref 8.4–10.4)
Chloride: 104 mEq/L (ref 98–109)
EGFR: 59 mL/min/{1.73_m2} — ABNORMAL LOW (ref >90)
Glucose: 111 mg/dl (ref 70–140)
Potassium: 4 mEq/L (ref 3.5–5.1)
Sodium: 137 mEq/L (ref 136–145)
Total Bilirubin: 0.58 mg/dL (ref 0.20–1.20)
Total Protein: 6.6 g/dL (ref 6.4–8.3)

## 2014-10-30 LAB — TECHNOLOGIST REVIEW

## 2014-10-30 NOTE — Telephone Encounter (Signed)
Returned call to patient's wife.  She requested change of lab appointment to today.  Lab appt moved to today at 12:15, verified lab orders are in.  Wife requested clarification in writing as to family visits, neutropenia/masking, and related issues as family is coming in to town this evening.  Let her know we would be happy to provide her with this information.  She voiced understanding.

## 2014-10-31 ENCOUNTER — Other Ambulatory Visit: Payer: Medicare Other

## 2014-12-20 ENCOUNTER — Telehealth: Payer: Self-pay | Admitting: *Deleted

## 2014-12-20 DIAGNOSIS — C679 Malignant neoplasm of bladder, unspecified: Secondary | ICD-10-CM

## 2014-12-20 NOTE — Telephone Encounter (Signed)
Patient's spouse Winifred called asking "to be seen sooner or talk with the nurse.  James [name] is changing and can't wait with these symptoms.  He used to just take tramadol at night and now he takes one during the day.  He also takes Tylenol arthritis.  He's motivated to walk but his left flank pain has increased.  He has decreased functionality or deconditioning.  It's harder for him to do things."  Will notify Dr. Lindi Adie and staff.  She denies need to be seen today but would like him seen before the weekend.  Verbalized frustration stating "all I want to do is curse" but very pleasant conversation.  Advised to take tramadol every four hours if pain has increased.  Return number [phone].

## 2014-12-20 NOTE — Telephone Encounter (Signed)
Called Winifred with instructions to bring him in tomorrow for [facility].  Asked for an 11:00 appointment.  P.O.F. Generated.

## 2014-12-20 NOTE — Telephone Encounter (Signed)
Can you have him be seen at symptom management clinic?

## 2014-12-21 ENCOUNTER — Ambulatory Visit (HOSPITAL_BASED_OUTPATIENT_CLINIC_OR_DEPARTMENT_OTHER): Payer: Medicare Other

## 2014-12-21 ENCOUNTER — Other Ambulatory Visit (HOSPITAL_BASED_OUTPATIENT_CLINIC_OR_DEPARTMENT_OTHER): Payer: Medicare Other

## 2014-12-21 ENCOUNTER — Other Ambulatory Visit: Payer: Self-pay | Admitting: Nurse Practitioner

## 2014-12-21 ENCOUNTER — Encounter: Payer: Self-pay | Admitting: Nurse Practitioner

## 2014-12-21 ENCOUNTER — Ambulatory Visit (HOSPITAL_BASED_OUTPATIENT_CLINIC_OR_DEPARTMENT_OTHER): Payer: Medicare Other | Admitting: Nurse Practitioner

## 2014-12-21 ENCOUNTER — Telehealth: Payer: Self-pay | Admitting: Nurse Practitioner

## 2014-12-21 VITALS — BP 108/68 | HR 66 | Temp 98.5°F | Resp 20

## 2014-12-21 DIAGNOSIS — C679 Malignant neoplasm of bladder, unspecified: Secondary | ICD-10-CM

## 2014-12-21 DIAGNOSIS — Z658 Other specified problems related to psychosocial circumstances: Secondary | ICD-10-CM | POA: Diagnosis not present

## 2014-12-21 DIAGNOSIS — G893 Neoplasm related pain (acute) (chronic): Secondary | ICD-10-CM

## 2014-12-21 DIAGNOSIS — R530 Neoplastic (malignant) related fatigue: Secondary | ICD-10-CM | POA: Insufficient documentation

## 2014-12-21 DIAGNOSIS — Z95828 Presence of other vascular implants and grafts: Secondary | ICD-10-CM

## 2014-12-21 LAB — COMPREHENSIVE METABOLIC PANEL (CC13)
ALT: 19 U/L (ref 0–55)
ANION GAP: 7 meq/L (ref 3–11)
AST: 23 U/L (ref 5–34)
Albumin: 3.4 g/dL — ABNORMAL LOW (ref 3.5–5.0)
Alkaline Phosphatase: 76 U/L (ref 40–150)
BILIRUBIN TOTAL: 0.47 mg/dL (ref 0.20–1.20)
BUN: 16.7 mg/dL (ref 7.0–26.0)
CO2: 23 meq/L (ref 22–29)
Calcium: 8.8 mg/dL (ref 8.4–10.4)
Chloride: 105 mEq/L (ref 98–109)
Creatinine: 1 mg/dL (ref 0.7–1.3)
EGFR: 73 mL/min/{1.73_m2} — ABNORMAL LOW (ref >90)
GLUCOSE: 114 mg/dL (ref 70–140)
POTASSIUM: 3.9 meq/L (ref 3.5–5.1)
Sodium: 135 mEq/L — ABNORMAL LOW (ref 136–145)
TOTAL PROTEIN: 6.8 g/dL (ref 6.4–8.3)

## 2014-12-21 LAB — MAGNESIUM (CC13): Magnesium: 2.1 mg/dl (ref 1.5–2.5)

## 2014-12-21 LAB — CBC WITH DIFFERENTIAL/PLATELET
BASO%: 0 % (ref 0.0–2.0)
Basophils Absolute: 0 10*3/uL (ref 0.0–0.1)
EOS ABS: 0.1 10*3/uL (ref 0.0–0.5)
EOS%: 0.9 % (ref 0.0–7.0)
HCT: 34.6 % — ABNORMAL LOW (ref 38.4–49.9)
HGB: 11.3 g/dL — ABNORMAL LOW (ref 13.0–17.1)
LYMPH#: 1.1 10*3/uL (ref 0.9–3.3)
LYMPH%: 19.2 % (ref 14.0–49.0)
MCH: 30.5 pg (ref 27.2–33.4)
MCHC: 32.7 g/dL (ref 32.0–36.0)
MCV: 93.3 fL (ref 79.3–98.0)
MONO#: 0.6 10*3/uL (ref 0.1–0.9)
MONO%: 10.1 % (ref 0.0–14.0)
NEUT%: 69.8 % (ref 39.0–75.0)
NEUTROS ABS: 3.8 10*3/uL (ref 1.5–6.5)
PLATELETS: 134 10*3/uL — AB (ref 140–400)
RBC: 3.71 10*6/uL — AB (ref 4.20–5.82)
RDW: 16.7 % — ABNORMAL HIGH (ref 11.0–14.6)
WBC: 5.5 10*3/uL (ref 4.0–10.3)

## 2014-12-21 MED ORDER — HEPARIN SOD (PORK) LOCK FLUSH 100 UNIT/ML IV SOLN
500.0000 [IU] | Freq: Once | INTRAVENOUS | Status: AC
  Administered 2014-12-21: 500 [IU] via INTRAVENOUS
  Filled 2014-12-21: qty 5

## 2014-12-21 MED ORDER — TRAMADOL HCL 50 MG PO TABS: 50.0000 mg | ORAL_TABLET | Freq: Four times a day (QID) | ORAL | Status: DC | PRN

## 2014-12-21 MED ORDER — SODIUM CHLORIDE 0.9 % IJ SOLN
10.0000 mL | INTRAMUSCULAR | Status: DC | PRN
  Filled 2014-12-21: qty 10

## 2014-12-21 MED ORDER — SODIUM CHLORIDE 0.9 % IJ SOLN
10.0000 mL | INTRAMUSCULAR | Status: DC | PRN
  Administered 2014-12-21 (×2): 10 mL via INTRAVENOUS
  Filled 2014-12-21: qty 10

## 2014-12-21 NOTE — Progress Notes (Signed)
SYMPTOM MANAGEMENT CLINIC   HPI: James [name] 79 y.o. male diagnosed with bladder cancer.  Patient is status post Taxol chemotherapy last received on 10/20/2014. Currently undergoing observation only.  Patient presented to the [place] today with complaint of mild progression of his chronic pain to his entire chest/trunk area, left flank area, and bilateral upper thighs.  He states he was able to get by with just taking Tylenol occasionally; but is now taking some leftover tramadol for his pain.  Patient states that he ate has a history of alcoholism 30 years ago; and spared concern regarding opiate addiction.  Patient denies any other new symptoms whatsoever.  He denies any recent fevers or chills.  Patient is also complaining of some chronic fatigue/deconditioning; despite working with both physical therapy and occupational therapy.  He admits to feeling fairly depressed and overwhelmed with his cancer diagnosis.  He is questioning if his continued fatigue and deconditioning is secondary to worsening progression of his disease; versus generalized deconditioning only.  Patient states that he is meeting with a counselor later this afternoon to discuss these stressors more.  He continues to take both of his antidepressants as previously directed.  HPI  ROS  Past Medical History  Diagnosis Date  . Atrial flutter 05/2009    STATUS POST CARDIOVERSION;  s/p RFCA in 2012 (Dr. Caryl Comes)  . Depression   . History of immunotherapy 2014    "had many times before and it worked; had a severe reaction 03-06-13; they coded me, I had sepsis; almost died" (August 31, 2013)  . OSA on CPAP   . Pneumonia   . CAD (coronary artery disease)     a. NSTEMI 07/2013:  LHC (08/03/13): mLAD 95% assoc w/ thrombus, dLAD 50%, pRCA 80%, mRCA 50%, normal LVF. => PCI:  Rebel (4 x 12 mm) BMS to the mLAD and Rebel (4 x 15 mm) BMS to the pRCA.  . Osteoarthritis   . Dyslipidemia     Lipitor started at time of NSTEMI  07/2013  . Bladder cancer     'started chemo 07/2013 " (August 31, 2013)    Past Surgical History  Procedure Laterality Date  . Carpal tunnel release Bilateral ~ 2012  . Atrial flutter ablation  2012  . Cardioversion  05/2009  . Transurethral resection of bladder tumor      "several" (08-31-2013)  . Cystoscopy  4-12/2012    "biopsies; removal of tissue" (2013-08-31)  . Renal mass excision  03/2013    "I've had 2 of these; percutaneous" (08-31-2013)  . Lymph node biopsy  06/2013    "in the area of the kidney" (08-31-2013)  . Tonsillectomy and adenoidectomy  1940's  . Left heart catheterization with coronary angiogram N/A 08/03/2013    Procedure: LEFT HEART CATHETERIZATION WITH CORONARY ANGIOGRAM;  Surgeon: Ramond Dial, MD;  Location[facility] CATH LAB;  Service: Cardiovascular;  Laterality: N/A;  . Percutaneous coronary stent intervention (pci-s)  08/03/2013    Procedure: PERCUTANEOUS CORONARY STENT INTERVENTION (PCI-S);  Surgeon: Ramond Dial, MD;  Location: [facility] CATH LAB;  Service: Cardiovascular;;    has Atrial flutter; Bladder cancer; Sleep apnea; Depression; Dyspnea; Fever; Hyponatremia; Atelectasis; Chest pain; Coronary atherosclerosis of native coronary artery; Dyslipidemia; Acute encephalopathy; Thrombocytopenia; Normocytic anemia; Obstructive sleep apnea; Metastasis from bladder cancer; Drug-induced skin rash; Cutaneous lupus erythematosus; Steroid-induced hyperglycemia; FUO (fever of unknown origin); Leukopenia; Palliative care encounter; DNR (do not resuscitate) discussion; Pain, generalized; Leg pain; Cancer associated pain; Neoplastic malignant related fatigue; and Psychosocial  stressors on his problem list.    is allergic to morphine; other; adhesive; bupropion; flomax; pantoprazole sodium; and tamsulosin.    Medication List       This list is accurate as of: 12/21/14  4:04 PM.  Always use your most recent med list.               acetaminophen 500 MG tablet  Commonly  known as:  TYLENOL  Take 500 mg by mouth every 6 (six) hours as needed for fever.     antiseptic oral rinse Liqd  15 mLs by Mouth Rinse route as needed for dry mouth.     aspirin 81 MG tablet  Take 81 mg by mouth daily.     atorvastatin 40 MG tablet  Commonly known as:  LIPITOR  TAKE 1 TABLET (40 MG TOTAL) BY MOUTH DAILY.     BENADRYL IJ  Inject 1 each as directed once.     calcium carbonate 1250 (500 CA) MG tablet  Commonly known as:  OS-CAL - dosed in mg of elemental calcium  Take 1 tablet (500 mg of elemental calcium total) by mouth 2 (two) times daily with a meal.     carvedilol 6.25 MG tablet  Commonly known as:  COREG  TAKE 1 TABLET (6.25 MG TOTAL) BY MOUTH 2 (TWO) TIMES DAILY.     DECADRON PO  Take 1 tablet by mouth daily as needed (after chemo treatment).     ferrous sulfate 325 (65 FE) MG tablet  Take 325 mg by mouth daily.     Garlic 572 MG Caps  Take 300 mg by mouth daily.     HYDROcodone-acetaminophen 5-325 MG per tablet  Commonly known as:  NORCO/VICODIN  Take 2 tablets by mouth every 4 (four) hours as needed for moderate pain.     lidocaine-prilocaine cream  Commonly known as:  EMLA  Apply topically as needed for port     loperamide 2 MG tablet  Commonly known as:  IMODIUM A-D  Take 2 mg by mouth 3 (three) times daily as needed for diarrhea or loose stools.     LORazepam 1 MG tablet  Commonly known as:  ATIVAN  Take 1 tablet (1 mg total) by mouth every 12 (twelve) hours as needed for anxiety.     morphine 15 MG 12 hr tablet  Commonly known as:  MS CONTIN  Take 1 tablet (15 mg total) by mouth every 12 (twelve) hours.     MULTIPLE VITAMIN PO  Take by mouth daily.     NETTLE LEAF PO  Take 300 mg by mouth daily.     nitroGLYCERIN 0.4 MG SL tablet  Commonly known as:  NITROSTAT  Place 0.4 mg under the tongue every 5 (five) minutes as needed for chest pain.     nystatin cream  Commonly known as:  MYCOSTATIN  Apply 1 application topically daily  as needed for dry skin.     OMEGA 3 PO  Take 1 capsule by mouth 2 (two) times daily. [phone] mg     predniSONE 5 MG tablet  Commonly known as:  DELTASONE  Take 1 tablet (5 mg total) by mouth daily with breakfast.     PROBIOTIC & ACIDOPHILUS EX ST PO  Take by mouth daily.     prochlorperazine 10 MG tablet  Commonly known as:  COMPAZINE  Take 10 mg by mouth every 6 (six) hours as needed for nausea or vomiting.     sertraline 100  MG tablet  Commonly known as:  ZOLOFT  Take 150 mg by mouth daily.     traMADol 50 MG tablet  Commonly known as:  ULTRAM  Take 1 tablet (50 mg total) by mouth every 6 (six) hours as needed for moderate pain.     triamcinolone cream 0.1 %  Commonly known as:  KENALOG  Apply 1 application topically daily as needed (for itching).     venlafaxine XR 150 MG 24 hr capsule  Commonly known as:  EFFEXOR-XR  Take 150 mg by mouth daily.         PHYSICAL EXAMINATION  Oncology Vitals 12/21/2014 10/20/2014 10/19/2014 10/19/2014 10/19/2014 10/19/2014 10/19/2014  Height - - - - - - -  Weight - - - - - - -  Weight (lbs) - - - - - - -  BMI (kg/m2) - - - - - - -  Temp 98.5 98.3 - - 97.8 97.9 97.7  Pulse 66 67 95 80 83 78 73  Resp '20 20 18 ' - '20 18 18  ' SpO2 100 100 - - 94 98 98  BSA (m2) - - - - - - -   BP Readings from Last 3 Encounters:  12/21/14 108/68  10/20/14 119/65  09/13/14 122/73    Physical Exam  Constitutional: He is oriented to person, place, and time and well-developed, well-nourished, and in no distress.  HENT:  Head: Normocephalic and atraumatic.  Eyes: Conjunctivae and EOM are normal. Pupils are equal, round, and reactive to light. Right eye exhibits no discharge. Left eye exhibits no discharge. No scleral icterus.  Neck: Normal range of motion.  Pulmonary/Chest: Effort normal. No respiratory distress.  Musculoskeletal: Normal range of motion.  Neurological: He is alert and oriented to person, place, and time. Gait normal.  Psychiatric:  Affect normal.  Nursing note and vitals reviewed.   LABORATORY DATA:. Appointment on 12/21/2014  Component Date Value Ref Range Status  . WBC 12/21/2014 5.5  4.0 - 10.3 10e3/uL Final  . NEUT# 12/21/2014 3.8  1.5 - 6.5 10e3/uL Final  . HGB 12/21/2014 11.3* 13.0 - 17.1 g/dL Final  . HCT 12/21/2014 34.6* 38.4 - 49.9 % Final  . Platelets 12/21/2014 134* 140 - 400 10e3/uL Final  . MCV 12/21/2014 93.3  79.3 - 98.0 fL Final  . MCH 12/21/2014 30.5  27.2 - 33.4 pg Final  . MCHC 12/21/2014 32.7  32.0 - 36.0 g/dL Final  . RBC 12/21/2014 3.71* 4.20 - 5.82 10e6/uL Final  . RDW 12/21/2014 16.7* 11.0 - 14.6 % Final  . lymph# 12/21/2014 1.1  0.9 - 3.3 10e3/uL Final  . MONO# 12/21/2014 0.6  0.1 - 0.9 10e3/uL Final  . Eosinophils Absolute 12/21/2014 0.1  0.0 - 0.5 10e3/uL Final  . Basophils Absolute 12/21/2014 0.0  0.0 - 0.1 10e3/uL Final  . NEUT% 12/21/2014 69.8  39.0 - 75.0 % Final  . LYMPH% 12/21/2014 19.2  14.0 - 49.0 % Final  . MONO% 12/21/2014 10.1  0.0 - 14.0 % Final  . EOS% 12/21/2014 0.9  0.0 - 7.0 % Final  . BASO% 12/21/2014 0.0  0.0 - 2.0 % Final  . Magnesium 12/21/2014 2.1  1.5 - 2.5 mg/dl Final  . Sodium 12/21/2014 135* 136 - 145 mEq/L Final  . Potassium 12/21/2014 3.9  3.5 - 5.1 mEq/L Final  . Chloride 12/21/2014 105  98 - 109 mEq/L Final  . CO2 12/21/2014 23  22 - 29 mEq/L Final  . Glucose 12/21/2014 114  70 - 140  mg/dl Final  . BUN 12/21/2014 16.7  7.0 - 26.0 mg/dL Final  . Creatinine 12/21/2014 1.0  0.7 - 1.3 mg/dL Final  . Total Bilirubin 12/21/2014 0.47  0.20 - 1.20 mg/dL Final  . Alkaline Phosphatase 12/21/2014 76  40 - 150 U/L Final  . AST 12/21/2014 23  5 - 34 U/L Final  . ALT 12/21/2014 19  0 - 55 U/L Final  . Total Protein 12/21/2014 6.8  6.4 - 8.3 g/dL Final  . Albumin 12/21/2014 3.4* 3.5 - 5.0 g/dL Final  . Calcium 12/21/2014 8.8  8.4 - 10.4 mg/dL Final  . Anion Gap 12/21/2014 7  3 - 11 mEq/L Final  . EGFR 12/21/2014 73* >90 ml/min/1.73 m2 Final   eGFR is calculated  using the CKD-EPI Creatinine Equation (2009)     RADIOGRAPHIC STUDIES: No results found.  ASSESSMENT/PLAN:    Bladder cancer Patient's last restaging CT obtained on 09/21/2014 revealed progression.  Patient underwent his last Taxol chemotherapy on 10/20/2014.  CBC obtained today was essentially stable/improved.  He is scheduled for labs and a restaging CT of the chest/abdomen/pelvis on 12/28/2014.  Patient is scheduled to return to the [place] on 01/04/2015 for follow-up visit to review scan results.  Patient is questioning if possible to move up his restaging scan and follow up visit to review the scan results.  He is very eager to know if there is continued progression of his cancer; versus simple deconditioning following cancer treatment.  Advised patient would consult with Dr. Lindi Adie; and call them back to let him know one way or another.     Cancer associated pain Patient is complaining of some mild, progressive pain to his entire trunk region, left flank, and aching to his bilateral upper thighs.  He states he typically takes only Tylenol; but has been taking some of his tramadol he had leftover for the pain.  Patient states that he has a history of alcoholism 30 years ago; and is very concerned regarding addiction to opiates.  Patient was in agreement to receive a refill of his tramadol to take only on an as-needed basis.     Neoplastic malignant related fatigue Patient has been working with both physical therapy and occupational therapy; but continues to complain of fatigue and probable deconditioning.  Patient is questioning if possible to move up his restaging scan and follow up visit to review the scan results.  He is very eager to know if there is continued progression of his cancer; versus simple deconditioning following cancer treatment.  Advised patient would consult with Dr. Lindi Adie; and call them back to let him know one way or another.    Psychosocial  stressors Long conversation with the patient regarding his continued fatigue and deconditioning following his recent cancer treatment.  Patient states that he typically has multiple goals he would like to achieve in life; but at this time.  He feels fairly overwhelmed with his cancer.  He states that his cancer diagnosis has become a full-time job for him.  Patient continues to take both of his antidepressant medications as previously prescribed.  Patient has an appointment later this afternoon with a counselor who is also a Education officer, museum.  Advised patient to keep his appointment with a social worker this afternoon; she'll let us know if we can be of any further service.  Also reminded patient that the [place] worker's are available on an as-needed basis as well.  Also, patient requested that handicap sticker paperwork be  prepared for him to pickup when he returns for his CT scan.   Patient stated understanding of all instructions; and was in agreement with this plan of care. The patient knows to call the clinic with any problems, questions or concerns.   Review/collaboration with Dr. Lindi Adie regarding all aspects of patient's visit today.   Total time spent with patient was 40 minutes;  with greater than 75 percent of that time spent in face to face counseling regarding patient's symptoms,  and coordination of care and follow up.  Disclaimer: This note was dictated with voice recognition software. Similar sounding words can inadvertently be transcribed and may not be corrected upon review.   Drue Second, NP 12/21/2014

## 2014-12-21 NOTE — Assessment & Plan Note (Addendum)
Long conversation with the patient regarding his continued fatigue and deconditioning following his recent cancer treatment.  Patient states that he typically has multiple goals he would like to achieve in life; but at this time.  He feels fairly overwhelmed with his cancer.  He states that his cancer diagnosis has become a full-time job for him.  Patient continues to take both of his antidepressant medications as previously prescribed.  Patient has an appointment later this afternoon with a counselor who is also a Education officer, museum.  Advised patient to keep his appointment with a social worker this afternoon; she'll let us know if we can be of any further service.  Also reminded patient that the [place] worker's are available on an as-needed basis as well.  Also, patient requested that handicap sticker paperwork be prepared for him to pickup when he returns for his CT scan.

## 2014-12-21 NOTE — Assessment & Plan Note (Signed)
Patient has been working with both physical therapy and occupational therapy; but continues to complain of fatigue and probable deconditioning.  Patient is questioning if possible to move up his restaging scan and follow up visit to review the scan results.  He is very eager to know if there is continued progression of his cancer; versus simple deconditioning following cancer treatment.  Advised patient would consult with Dr. Lindi Adie; and call them back to let him know one way or another.

## 2014-12-21 NOTE — Assessment & Plan Note (Addendum)
Patient's last restaging CT obtained on 09/21/2014 revealed progression.  Patient underwent his last Taxol chemotherapy on 10/20/2014.  CBC obtained today was essentially stable/improved.  He is scheduled for labs and a restaging CT of the chest/abdomen/pelvis on 12/28/2014.  Patient is scheduled to return to the [place] on 01/04/2015 for follow-up visit to review scan results.  Patient is questioning if possible to move up his restaging scan and follow up visit to review the scan results.  He is very eager to know if there is continued progression of his cancer; versus simple deconditioning following cancer treatment.  Advised patient would consult with Dr. Lindi Adie; and call them back to let him know one way or another.

## 2014-12-21 NOTE — Assessment & Plan Note (Signed)
Patient is complaining of some mild, progressive pain to his entire trunk region, left flank, and aching to his bilateral upper thighs.  He states he typically takes only Tylenol; but has been taking some of his tramadol he had leftover for the pain.  Patient states that he has a history of alcoholism 30 years ago; and is very concerned regarding addiction to opiates.  Patient was in agreement to receive a refill of his tramadol to take only on an as-needed basis.

## 2014-12-21 NOTE — Patient Instructions (Signed)

## 2014-12-21 NOTE — Telephone Encounter (Signed)
Advised patient would try to reschedule.  Restaging CT to an earlier date if at all possible.  Also, advised patient that Dr. Geralyn Flash nurse would have new handicapped sticker paperwork ready for him sometime next week.

## 2014-12-22 ENCOUNTER — Telehealth: Payer: Self-pay | Admitting: Hematology and Oncology

## 2014-12-22 NOTE — Telephone Encounter (Signed)
Spoke with patient wife to confirm Appointment for 05/17 & 05/19

## 2014-12-25 ENCOUNTER — Other Ambulatory Visit: Payer: Self-pay

## 2014-12-25 DIAGNOSIS — C799 Secondary malignant neoplasm of unspecified site: Secondary | ICD-10-CM

## 2014-12-25 DIAGNOSIS — C679 Malignant neoplasm of bladder, unspecified: Secondary | ICD-10-CM

## 2014-12-26 ENCOUNTER — Ambulatory Visit (HOSPITAL_COMMUNITY)
Admission: RE | Admit: 2014-12-26 | Discharge: 2014-12-26 | Disposition: A | Discharge status: Home / Self Care | Payer: Medicare Other | Source: Ambulatory Visit | Attending: Hematology and Oncology | Admitting: Hematology and Oncology

## 2014-12-26 ENCOUNTER — Encounter (HOSPITAL_COMMUNITY): Payer: Self-pay

## 2014-12-26 ENCOUNTER — Ambulatory Visit (HOSPITAL_BASED_OUTPATIENT_CLINIC_OR_DEPARTMENT_OTHER): Payer: Medicare Other

## 2014-12-26 ENCOUNTER — Ambulatory Visit (HOSPITAL_COMMUNITY): Payer: Medicare Other

## 2014-12-26 ENCOUNTER — Other Ambulatory Visit (HOSPITAL_BASED_OUTPATIENT_CLINIC_OR_DEPARTMENT_OTHER): Payer: Medicare Other

## 2014-12-26 VITALS — BP 141/75 | HR 73 | Resp 14

## 2014-12-26 DIAGNOSIS — R531 Weakness: Secondary | ICD-10-CM | POA: Diagnosis not present

## 2014-12-26 DIAGNOSIS — M479 Spondylosis, unspecified: Secondary | ICD-10-CM | POA: Insufficient documentation

## 2014-12-26 DIAGNOSIS — C679 Malignant neoplasm of bladder, unspecified: Secondary | ICD-10-CM

## 2014-12-26 DIAGNOSIS — I7090 Unspecified atherosclerosis: Secondary | ICD-10-CM | POA: Insufficient documentation

## 2014-12-26 DIAGNOSIS — N62 Hypertrophy of breast: Secondary | ICD-10-CM | POA: Insufficient documentation

## 2014-12-26 DIAGNOSIS — C787 Secondary malignant neoplasm of liver and intrahepatic bile duct: Secondary | ICD-10-CM | POA: Diagnosis not present

## 2014-12-26 DIAGNOSIS — C799 Secondary malignant neoplasm of unspecified site: Secondary | ICD-10-CM

## 2014-12-26 DIAGNOSIS — E279 Disorder of adrenal gland, unspecified: Secondary | ICD-10-CM | POA: Diagnosis not present

## 2014-12-26 DIAGNOSIS — R591 Generalized enlarged lymph nodes: Secondary | ICD-10-CM | POA: Diagnosis not present

## 2014-12-26 DIAGNOSIS — R110 Nausea: Secondary | ICD-10-CM | POA: Diagnosis not present

## 2014-12-26 DIAGNOSIS — C786 Secondary malignant neoplasm of retroperitoneum and peritoneum: Secondary | ICD-10-CM | POA: Diagnosis not present

## 2014-12-26 LAB — COMPREHENSIVE METABOLIC PANEL (CC13)
ALT: 19 U/L (ref 0–55)
AST: 23 U/L (ref 5–34)
Albumin: 3.5 g/dL (ref 3.5–5.0)
Alkaline Phosphatase: 75 U/L (ref 40–150)
Anion Gap: 11 mEq/L (ref 3–11)
BILIRUBIN TOTAL: 0.32 mg/dL (ref 0.20–1.20)
BUN: 18.5 mg/dL (ref 7.0–26.0)
CALCIUM: 9.1 mg/dL (ref 8.4–10.4)
CHLORIDE: 99 meq/L (ref 98–109)
CO2: 25 meq/L (ref 22–29)
Creatinine: 1 mg/dL (ref 0.7–1.3)
EGFR: 77 mL/min/{1.73_m2} — ABNORMAL LOW (ref >90)
Glucose: 103 mg/dl (ref 70–140)
Potassium: 4.2 mEq/L (ref 3.5–5.1)
SODIUM: 135 meq/L — AB (ref 136–145)
TOTAL PROTEIN: 7.1 g/dL (ref 6.4–8.3)

## 2014-12-26 LAB — CBC WITH DIFFERENTIAL/PLATELET
BASO%: 0.2 % (ref 0.0–2.0)
BASOS ABS: 0 10*3/uL (ref 0.0–0.1)
EOS%: 1 % (ref 0.0–7.0)
Eosinophils Absolute: 0.1 10*3/uL (ref 0.0–0.5)
HEMATOCRIT: 34.7 % — AB (ref 38.4–49.9)
HEMOGLOBIN: 11.4 g/dL — AB (ref 13.0–17.1)
LYMPH#: 0.8 10*3/uL — AB (ref 0.9–3.3)
LYMPH%: 13.2 % — ABNORMAL LOW (ref 14.0–49.0)
MCH: 30.3 pg (ref 27.2–33.4)
MCHC: 32.8 g/dL (ref 32.0–36.0)
MCV: 92.2 fL (ref 79.3–98.0)
MONO#: 0.7 10*3/uL (ref 0.1–0.9)
MONO%: 11.2 % (ref 0.0–14.0)
NEUT#: 4.5 10*3/uL (ref 1.5–6.5)
NEUT%: 74.4 % (ref 39.0–75.0)
Platelets: 181 10*3/uL (ref 140–400)
RBC: 3.77 10*6/uL — ABNORMAL LOW (ref 4.20–5.82)
RDW: 17.6 % — ABNORMAL HIGH (ref 11.0–14.6)
WBC: 6 10*3/uL (ref 4.0–10.3)

## 2014-12-26 MED ORDER — SODIUM CHLORIDE 0.9 % IJ SOLN
10.0000 mL | INTRAMUSCULAR | Status: DC | PRN
  Administered 2014-12-26: 10 mL via INTRAVENOUS
  Filled 2014-12-26: qty 10

## 2014-12-26 MED ORDER — HEPARIN SOD (PORK) LOCK FLUSH 100 UNIT/ML IV SOLN
500.0000 [IU] | Freq: Once | INTRAVENOUS | Status: AC
  Administered 2014-12-26: 500 [IU] via INTRAVENOUS
  Filled 2014-12-26: qty 5

## 2014-12-26 MED ORDER — IOHEXOL 300 MG/ML  SOLN
100.0000 mL | Freq: Once | INTRAMUSCULAR | Status: AC | PRN
  Administered 2014-12-26: 80 mL via INTRAVENOUS

## 2014-12-26 NOTE — Patient Instructions (Signed)

## 2014-12-26 NOTE — Progress Notes (Signed)
Patient advised to come back and have power port needle removed if CT does not remove it.  Told to walk back to chemo room if it is after 4:30.  Walked patient down the hall to make sure he knows where the chemo room is.  Patient voiced understanding.  Called CT, spoke with Thayer Headings.  Asked her to send pt back if they do not remove needle.  She said they had someone who would take care of it.

## 2014-12-28 ENCOUNTER — Ambulatory Visit (HOSPITAL_COMMUNITY): Payer: Medicare Other

## 2014-12-28 ENCOUNTER — Other Ambulatory Visit: Payer: Medicare Other

## 2014-12-28 ENCOUNTER — Ambulatory Visit (HOSPITAL_BASED_OUTPATIENT_CLINIC_OR_DEPARTMENT_OTHER): Payer: Medicare Other | Admitting: Hematology and Oncology

## 2014-12-28 VITALS — BP 131/66 | HR 72 | Temp 97.5°F | Resp 18 | Ht 69.0 in | Wt 244.2 lb

## 2014-12-28 DIAGNOSIS — C787 Secondary malignant neoplasm of liver and intrahepatic bile duct: Secondary | ICD-10-CM

## 2014-12-28 DIAGNOSIS — C781 Secondary malignant neoplasm of mediastinum: Secondary | ICD-10-CM

## 2014-12-28 DIAGNOSIS — C679 Malignant neoplasm of bladder, unspecified: Secondary | ICD-10-CM | POA: Diagnosis not present

## 2014-12-28 NOTE — Assessment & Plan Note (Signed)
Metastatic bladder cancer: Originally diagnosed in 1996 and superficial bladder cancer, diagnosed with metastatic disease 06/20/2013, received gemcitabine and cisplatin 07/14/2013 (had MI) 5 cycles, progression 01/26/2014, continued to progress 03/16/2014,ACP-196 + pembrolizumab on 05/18/2014, Taxol February 2016 1 caused severe pain in extremities.  CT scan review 12/26/2014: Compared to March 2016, progression of malignancy with enlargement of mediastinal lymph nodes, numerous new liver lesions largest 4.6 x 2.9 cm +11 other lesions, slightly worse center a concurrent retroperitoneal lymph nodes.  Recommendation: 1. I recommended hospice care and no further chemotherapy.

## 2014-12-28 NOTE — Progress Notes (Signed)
Patient Care Team: Dione Plover, MD as PCP - General (Internal Medicine)  DIAGNOSIS: No matching staging information was found for the patient.  SUMMARY OF ONCOLOGIC HISTORY:   Bladder cancer metastasized to liver  - 1996-2002: History of recurrent superficial urothelial carcinoma. Followed by Dr. Lawerance Bach. - 12/01/2012: L renal pelvis papillary urothelial carcinoma, low grade w/o evidence of invasive component but biopsy superficial in nature. - 04/05/2013: L renal collecting duct fragment low grade urothelial papillary carcinoma (muscularis present and uninvolved). - treated with mitomycin C (after above 2 percutaneous excisions). Had required temporary L PCN tube. - 06/06/2013: CT a/p enlarging paraaortic LNs (2.2 x 1.7 cm- > 3.8 x 2.6 cm), possible thrombus R internal iliac artery, urothelial thickening L upper pole stable - 06/20/2013: FNA RP LN - metastatic urothelial Ca - 07/08/2013 CT chest multiple mediastinal LN - 07/14/2013: Gemcitabine/cisplatin x 1 cycle. Had MI after 1st cycle, require 2 stents. - 08/12/2013- switched to gemcitabine/carboplatin 2/2 to renal concern - 09/01/2013: CT c/a/p Decreased mediastinal and RP adenopathy, stable liver lesions (thought to be cysts), stable subcarinal lesion - 11/01/2013: similar/slightly decreased RP LNs, similar mediastinal LN. - 11/29/2013: completed total 5 cycles of platinum/gemcitabine (had several holds 2/2 to anemia, best overall response PR), then observation - 01/26/2014: CT c/a/p enlarging posterior mediastinal and RP adenopathy (peri aortic 2.1 x 2 cm -> 2.4 x 2.4 cm; 8 mm -> 14 mm) and abnormal enhancement in L upper pole - 03/16/2014: Progressive mediastinal (LNs in 1-2 cm range, index 13->14 mm change) / increased RP adenopathy (aortocaval 13 mm-> 37mm, L para-aortic 11 mm-> 16 mm, 15 mm-> 17 mm) - 04/12/14 Cr 1.22 - 81/19/14 [id number] [place] Bladder randomized to ACP-196 + pembrolizumab - 06/28/14 C2D1 pembrolizumab infusion held 2/2  to worsening skin lesions. Continue with ACP-196 - 07/05/14 C2D1 infusion pembro - 07/19/14 Pembro + ACP-196 due to grade 3 maculo-papular rash and grade 2 transaminitis Feb 2016: Taxol x 1: severe pain in extremities and fever  CHIEF COMPLIANT: Follow-up to discuss CT scans  INTERVAL HISTORY: James [name] is a 79 year old, with above-mentioned history metastatic bladder cancer who had seen in the hospital and the recommended discontinuing Taxol treatment because he was in severe pain in his extremities along with fever from Taxol. For the past 2 months he is enjoying his time by fishing and spending time with his grandchildren and he definitely appreciates the quality of life improvement not being on any treatment. Unfortunately his CT scans done today showed progression of disease especially in the liver also progression mediastinal lymph nodes. He does have some fatigue and mild abdominal pain but his quality of life and symptoms are not much worse. He currently takes Tylenol and Ultram which appeared to be doing the job and he does not take more than 1 or 2 TIMES a day.   REVIEW OF SYSTEMS:   Constitutional: Denies fevers, chills or abnormal weight loss Eyes: Denies blurriness of vision Ears, nose, mouth, throat, and face: Denies mucositis or sore throat Respiratory: Denies cough, dyspnea or wheezes Cardiovascular: Denies palpitation, chest discomfort or lower extremity swelling Gastrointestinal:  Lower abdominal pain Skin: Denies abnormal skin rashes Lymphatics: Denies new lymphadenopathy or easy bruising Neurological:Denies numbness, tingling or new weaknesses Behavioral/Psych: Mood is stable, no new changes  All other systems were reviewed with the patient and are negative.  I have reviewed the past medical history, past surgical history, social history and family history with the patient and they are unchanged from previous  note.  ALLERGIES:  is allergic to morphine; other; adhesive;  bupropion; flomax; pantoprazole sodium; and tamsulosin.  MEDICATIONS:  Current Outpatient Prescriptions  Medication Sig Dispense Refill  . acetaminophen (TYLENOL) 500 MG tablet Take 500 mg by mouth every 6 (six) hours as needed for fever.    Marland Kitchen antiseptic oral rinse (BIOTENE) LIQD 15 mLs by Mouth Rinse route as needed for dry mouth.    Marland Kitchen aspirin 81 MG tablet Take 81 mg by mouth daily.    Marland Kitchen atorvastatin (LIPITOR) 40 MG tablet TAKE 1 TABLET (40 MG TOTAL) BY MOUTH DAILY. 90 tablet 3  . calcium carbonate (OS-CAL - DOSED IN MG OF ELEMENTAL CALCIUM) 1250 (500 CA) MG tablet Take 1 tablet (500 mg of elemental calcium total) by mouth 2 (two) times daily with a meal. 14 tablet 0  . carvedilol (COREG) 6.25 MG tablet TAKE 1 TABLET (6.25 MG TOTAL) BY MOUTH 2 (TWO) TIMES DAILY. 180 tablet 3  . Dexamethasone (DECADRON PO) Take 1 tablet by mouth daily as needed (after chemo treatment).    . DiphenhydrAMINE HCl (BENADRYL IJ) Inject 1 each as directed once.    . ferrous sulfate 325 (65 FE) MG tablet Take 325 mg by mouth daily.     . Garlic 335 MG CAPS Take 300 mg by mouth daily.    Marland Kitchen HYDROcodone-acetaminophen (NORCO/VICODIN) 5-325 MG per tablet Take 2 tablets by mouth every 4 (four) hours as needed for moderate pain. 60 tablet 0  . lidocaine-prilocaine (EMLA) cream Apply topically as needed for port    . loperamide (IMODIUM A-D) 2 MG tablet Take 2 mg by mouth 3 (three) times daily as needed for diarrhea or loose stools.    Marland Kitchen LORazepam (ATIVAN) 1 MG tablet Take 1 tablet (1 mg total) by mouth every 12 (twelve) hours as needed for anxiety. 30 tablet 0  . morphine (MS CONTIN) 15 MG 12 hr tablet Take 1 tablet (15 mg total) by mouth every 12 (twelve) hours. 60 tablet 0  . MULTIPLE VITAMIN PO Take by mouth daily.    . Nettle, Urtica Dioica, (NETTLE LEAF PO) Take 300 mg by mouth daily.    . nitroGLYCERIN (NITROSTAT) 0.4 MG SL tablet Place 0.4 mg under the tongue every 5 (five) minutes as needed for chest pain.     Marland Kitchen  nystatin cream (MYCOSTATIN) Apply 1 application topically daily as needed for dry skin.    . Omega-3 Fatty Acids (OMEGA 3 PO) Take 1 capsule by mouth 2 (two) times daily. [id number] mg    . predniSONE (DELTASONE) 5 MG tablet Take 1 tablet (5 mg total) by mouth daily with breakfast. (Patient taking differently: Take 10 mg by mouth daily with breakfast. ) 5 tablet 0  . Probiotic Product (PROBIOTIC & ACIDOPHILUS EX ST PO) Take by mouth daily.    . prochlorperazine (COMPAZINE) 10 MG tablet Take 10 mg by mouth every 6 (six) hours as needed for nausea or vomiting.    . sertraline (ZOLOFT) 100 MG tablet Take 150 mg by mouth daily.     . traMADol (ULTRAM) 50 MG tablet Take 1 tablet (50 mg total) by mouth every 6 (six) hours as needed for moderate pain. 45 tablet 0  . triamcinolone cream (KENALOG) 0.1 % Apply 1 application topically daily as needed (for itching).    . venlafaxine XR (EFFEXOR-XR) 150 MG 24 hr capsule Take 150 mg by mouth daily.      No current facility-administered medications for this visit.    PHYSICAL  EXAMINATION: ECOG PERFORMANCE STATUS: 2 - Symptomatic, <50% confined to bed  Filed Vitals:   12/28/14 0928  BP: 131/66  Pulse: 72  Temp: 97.5 F (36.4 C)  Resp: 18   Filed Weights   12/28/14 0928  Weight: 244 lb 3.2 oz (110.768 kg)    GENERAL:alert, no distress and comfortable SKIN: skin color, texture, turgor are normal, no rashes or significant lesions EYES: normal, Conjunctiva are pink and non-injected, sclera clear OROPHARYNX:no exudate, no erythema and lips, buccal mucosa, and tongue normal  NECK: supple, thyroid normal size, non-tender, without nodularity LYMPH:  no palpable lymphadenopathy in the cervical, axillary or inguinal LUNGS: clear to auscultation and percussion with normal breathing effort HEART: regular rate & rhythm and no murmurs and no lower extremity edema ABDOMEN: Abdominal tenderness Musculoskeletal:no cyanosis of digits and no clubbing  NEURO: alert  & oriented x 3 with fluent speech, no focal motor/sensory deficits  LABORATORY DATA:  I have reviewed the data as listed   Chemistry      Component Value Date/Time   NA 135* 12/26/2014 1502   NA 135 10/20/2014 0813   K 4.2 12/26/2014 1502   K 4.6 10/20/2014 0813   CL 104 10/20/2014 0813   CO2 25 12/26/2014 1502   CO2 25 10/20/2014 0813   BUN 18.5 12/26/2014 1502   BUN 23 10/20/2014 0813   CREATININE 1.0 12/26/2014 1502   CREATININE 1.31 10/20/2014 0813      Component Value Date/Time   CALCIUM 9.1 12/26/2014 1502   CALCIUM 8.2* 10/20/2014 0813   ALKPHOS 75 12/26/2014 1502   ALKPHOS 45 10/20/2014 0813   AST 23 12/26/2014 1502   AST 43* 10/20/2014 0813   ALT 19 12/26/2014 1502   ALT 35 10/20/2014 0813   BILITOT 0.32 12/26/2014 1502   BILITOT 1.1 10/20/2014 0813       Lab Results  Component Value Date   WBC 6.0 12/26/2014   HGB 11.4* 12/26/2014   HCT 34.7* 12/26/2014   MCV 92.2 12/26/2014   PLT 181 12/26/2014   NEUTROABS 4.5 12/26/2014     RADIOGRAPHIC STUDIES: I have personally reviewed the radiology reports and agreed with their findings. Ct Chest W Contrast  12/26/2014   CLINICAL DATA:  Restaging metastatic bladder cancer. Increased weakness. Nausea. Last chemotherapy 10/20/2014.  EXAM: CT CHEST AND ABDOMEN WITH CONTRAST  TECHNIQUE: Multidetector CT imaging of the chest and abdomen was performed following the standard protocol during bolus administration of intravenous contrast.  CONTRAST:  62mL OMNIPAQUE IOHEXOL 300 MG/ML  SOLN  COMPARISON:  10/19/2014  FINDINGS: CT CHEST FINDINGS  Mediastinum/Nodes: Clustered right supraclavicular nodes, 1 measuring 1 cm in short axis on image 3 series 2, new. Increased size of some right upper paratracheal lymph nodes, including a right upper paratracheal node measuring 1.5 cm in short axis on image 7 series 2 (formerly 0.6 cm). Numerous additional paratracheal lymph nodes are present, with an index node measuring 2.1 cm on image 23  series 2, formerly 2.0 cm. Subcarinal node 1.9 cm on image 30 series 2, formerly 1.6 cm. Prevascular node 1.3 cm on image 13 series 2, formerly 1.0 cm.  Coronary and aortic atherosclerotic calcification noted. Lower thoracic periaortic adenopathy is again noted. Gynecomastia noted, left greater than right.  Lungs/Pleura: No pulmonary metastatic lesions are identified.  Musculoskeletal: Thoracic spondylosis  CT ABDOMEN FINDINGS  Hepatobiliary: In addition to several hepatic cysts seen on the prior exam, there are numerous new hepatic metastatic lesions. One a largest cyst posteriorly in  segment 7 the liver, or a subcapsular lesion measures 4.6 by 2.9 cm. About 11 other metastatic lesions are visible in the liver.  Contracted gallbladder.  Pancreas: There is stranding along the root of the mesentery which may be from tumor congestion given the adenopathy, although pancreatitis is not completely excluded.  Spleen: Unremarkable  Adrenals/Urinary Tract: The left adrenal mass measures 3.2 by 1.7 cm (formerly 3.0 by 1.9 cm). Metastatic deposit in the left lateral perirenal space 1.5 by 1.6 cm, formerly 1.4 by 1.4 cm. This was previously noted to be below the spleen but is retroperitoneal and position.  Stomach/Bowel: Unremarkable  Vascular/Lymphatic: Adenopathy in the porta hepatis, gastrohepatic ligament, root of the mesentery, and retroperitoneum appears worsened and is indistinctly marginated. An index aortocaval node is stable in size by my measurements at 1.7 cm. Aortoiliac atherosclerotic vascular disease.  Other: Trace fluid inferiorly in the left paracolic gutter.  Musculoskeletal: Lumbar spondylosis and degenerative disc disease.  IMPRESSION: 1. Progression of malignancy with enlargement of mediastinal lymph nodes; numerous new hepatic masses compatible with metastatic disease; slight worsening of the mesenteric and retroperitoneal adenopathy; and enlargement of a tumor implant in the left perirenal space. 2.  Trace fluid in the left paracolic gutter, incompletely visualized. 3. Essentially stable size of left adrenal mass. 4. Atherosclerosis.   Electronically Signed   By: Van Clines M.D.   On: 12/26/2014 18:46   Ct Abdomen W Contrast  12/26/2014   CLINICAL DATA:  Restaging metastatic bladder cancer. Increased weakness. Nausea. Last chemotherapy 10/20/2014.  EXAM: CT CHEST AND ABDOMEN WITH CONTRAST  TECHNIQUE: Multidetector CT imaging of the chest and abdomen was performed following the standard protocol during bolus administration of intravenous contrast.  CONTRAST:  27mL OMNIPAQUE IOHEXOL 300 MG/ML  SOLN  COMPARISON:  10/19/2014  FINDINGS: CT CHEST FINDINGS  Mediastinum/Nodes: Clustered right supraclavicular nodes, 1 measuring 1 cm in short axis on image 3 series 2, new. Increased size of some right upper paratracheal lymph nodes, including a right upper paratracheal node measuring 1.5 cm in short axis on image 7 series 2 (formerly 0.6 cm). Numerous additional paratracheal lymph nodes are present, with an index node measuring 2.1 cm on image 23 series 2, formerly 2.0 cm. Subcarinal node 1.9 cm on image 30 series 2, formerly 1.6 cm. Prevascular node 1.3 cm on image 13 series 2, formerly 1.0 cm.  Coronary and aortic atherosclerotic calcification noted. Lower thoracic periaortic adenopathy is again noted. Gynecomastia noted, left greater than right.  Lungs/Pleura: No pulmonary metastatic lesions are identified.  Musculoskeletal: Thoracic spondylosis  CT ABDOMEN FINDINGS  Hepatobiliary: In addition to several hepatic cysts seen on the prior exam, there are numerous new hepatic metastatic lesions. One a largest cyst posteriorly in segment 7 the liver, or a subcapsular lesion measures 4.6 by 2.9 cm. About 11 other metastatic lesions are visible in the liver.  Contracted gallbladder.  Pancreas: There is stranding along the root of the mesentery which may be from tumor congestion given the adenopathy, although  pancreatitis is not completely excluded.  Spleen: Unremarkable  Adrenals/Urinary Tract: The left adrenal mass measures 3.2 by 1.7 cm (formerly 3.0 by 1.9 cm). Metastatic deposit in the left lateral perirenal space 1.5 by 1.6 cm, formerly 1.4 by 1.4 cm. This was previously noted to be below the spleen but is retroperitoneal and position.  Stomach/Bowel: Unremarkable  Vascular/Lymphatic: Adenopathy in the porta hepatis, gastrohepatic ligament, root of the mesentery, and retroperitoneum appears worsened and is indistinctly marginated. An index aortocaval node is  stable in size by my measurements at 1.7 cm. Aortoiliac atherosclerotic vascular disease.  Other: Trace fluid inferiorly in the left paracolic gutter.  Musculoskeletal: Lumbar spondylosis and degenerative disc disease.  IMPRESSION: 1. Progression of malignancy with enlargement of mediastinal lymph nodes; numerous new hepatic masses compatible with metastatic disease; slight worsening of the mesenteric and retroperitoneal adenopathy; and enlargement of a tumor implant in the left perirenal space. 2. Trace fluid in the left paracolic gutter, incompletely visualized. 3. Essentially stable size of left adrenal mass. 4. Atherosclerosis.   Electronically Signed   By: Van Clines M.D.   On: 12/26/2014 18:46     ASSESSMENT & PLAN:  Bladder cancer metastasized to liver Metastatic bladder cancer: Originally diagnosed in 1996 and superficial bladder cancer, diagnosed with metastatic disease 06/20/2013, received gemcitabine and cisplatin 07/14/2013 (had MI) 5 cycles, progression 01/26/2014, continued to progress 03/16/2014,ACP-196 + pembrolizumab on 05/18/2014, Taxol February 2016 1 caused severe pain in extremities.  CT scan review 12/26/2014: Compared to March 2016, progression of malignancy with enlargement of mediastinal lymph nodes, numerous new liver lesions largest 4.6 x 2.9 cm +11 other lesions, slightly worse center a concurrent retroperitoneal  lymph nodes.  Recommendation: 1. I recommended hospice care and no further chemotherapy. 2. I will discuss with Dr. Aline Brochure if he would like to see him back again to discuss the results of foundation one testing and if any biologic agents might be of benefit. He will contact Dr. Aline Brochure to discuss the results of the foundation 1 test. If there were any promising treatment options that the patient would like to receive, he can certainly get treated at [facility] or if necessary we would be happy to assist him in any way we can.  Further follow-ups would be on an as-needed basis. If he does not want to go on any further treatments are recommended that he should sign up with hospice care. They would be able to assist him when things get worse in terms of either pain or suffering. I believe patient's wife is on board with this plan. However he would like to discuss with Dr. Aline Brochure about his options and will make a final decision. We will then make the referral to hospice care once he has that evaluation.   No orders of the defined types were placed in this encounter.   The patient has a good understanding of the overall plan. he agrees with it. he will call with any problems that may develop before the next visit here.   Rulon Eisenmenger, MD

## 2015-01-02 ENCOUNTER — Encounter: Payer: Self-pay | Admitting: Hematology and Oncology

## 2015-01-02 ENCOUNTER — Telehealth: Payer: Self-pay | Admitting: *Deleted

## 2015-01-02 NOTE — Telephone Encounter (Signed)
Received call from [facility] from [place]; pt has decided to pursue hospice; James [name] would like to know if Dr. Lindi Adie would be attending and symptom mgmt. She can be reached at [phone]

## 2015-01-02 NOTE — Telephone Encounter (Signed)
Spoke with Jocelyn Lamer today. Dr. Lindi Adie to be attending and hospice to do symptom management.

## 2015-01-03 ENCOUNTER — Encounter: Payer: Self-pay | Admitting: *Deleted

## 2015-01-03 NOTE — Progress Notes (Signed)
Received [facility] One report from [facility], sent to scan.

## 2015-01-04 ENCOUNTER — Ambulatory Visit: Payer: Medicare Other | Admitting: Hematology and Oncology

## 2015-01-08 ENCOUNTER — Encounter: Payer: Self-pay | Admitting: Hematology and Oncology

## 2015-01-09 NOTE — Progress Notes (Signed)
    Dr Lindi Adie will call call patient to discuss.     Message     Dear Dr. Lindi Adie,        I'm sorry I did not make it to your presentation last Thurs. My wife, James [name], thought it was very informative.         I am writing to inquire about meeting with you. My family has requested a number of commitments for visits (they to see me and vice-versa), and [name] and I have to make some decisions about whether to defer selling our house and moving.  We would be helped greatly if your communication with Dr. Aline Brochure, as well as the [facility] report, has yielded any news or new options. Would it be possible to have a consult with you this week (May 31-June 3)?         Hope you got a day off for the holiday.         James [name]      [place] Size     Small Medium Large Extra Extra Large    James [name]  01/08/2015  Patient Email  MRN:  [id number]   Description: 79 year old male  Provider: Nicholas Lose, MD  Department: [facility]          Call Documentation     No notes of this type exist for this encounter.     Encounter MyChart Messages     Read Composed From To Subject    Y 01/08/2015 5:43 PM Shanon Ace, MD Visit Follow-Up Question

## 2015-01-10 ENCOUNTER — Other Ambulatory Visit: Payer: Self-pay | Admitting: Hematology and Oncology

## 2015-01-10 DIAGNOSIS — C679 Malignant neoplasm of bladder, unspecified: Secondary | ICD-10-CM

## 2015-01-10 DIAGNOSIS — C787 Secondary malignant neoplasm of liver and intrahepatic bile duct: Principal | ICD-10-CM

## 2015-01-10 MED ORDER — TRAMADOL HCL 50 MG PO TABS: 50.0000 mg | ORAL_TABLET | Freq: Four times a day (QID) | ORAL | Status: DC | PRN

## 2015-01-10 MED ORDER — TRAMADOL HCL 50 MG PO TABS
50.0000 mg | ORAL_TABLET | Freq: Four times a day (QID) | ORAL | Status: DC | PRN
Start: 2015-01-10 — End: 2015-04-27

## 2015-01-11 ENCOUNTER — Encounter: Payer: Self-pay | Admitting: Hematology and Oncology

## 2015-01-18 ENCOUNTER — Other Ambulatory Visit: Payer: Self-pay | Admitting: Nurse Practitioner

## 2015-01-18 ENCOUNTER — Telehealth: Payer: Self-pay

## 2015-01-18 NOTE — Telephone Encounter (Signed)
Order for palliative care faxed to [facility].  Sent to scan.

## 2015-01-27 ENCOUNTER — Ambulatory Visit (INDEPENDENT_AMBULATORY_CARE_PROVIDER_SITE_OTHER): Payer: Medicare Other | Admitting: Family Medicine

## 2015-01-27 VITALS — BP 114/68 | HR 71 | Temp 98.6°F | Resp 16 | Ht 69.0 in | Wt 238.0 lb

## 2015-01-27 DIAGNOSIS — H01006 Unspecified blepharitis left eye, unspecified eyelid: Secondary | ICD-10-CM

## 2015-01-27 MED ORDER — ERYTHROMYCIN 5 MG/GM OP OINT: 1.0000 "application " | TOPICAL_OINTMENT | Freq: Four times a day (QID) | OPHTHALMIC | Status: DC

## 2015-01-27 NOTE — Patient Instructions (Signed)
Blepharitis Blepharitis is redness, soreness, and swelling (inflammation) of one or both eyelids. It may be caused by an allergic reaction or a bacterial infection. Blepharitis may also be associated with reddened, scaly skin (seborrhea) of the scalp and eyebrows. While you sleep, eye discharge may cause your eyelashes to stick together. Your eyelids may itch, burn, swell, and may lose their lashes. These will grow back. Your eyes may become sensitive. Blepharitis may recur and need repeated treatment. If this is the case, you may require further evaluation by an eye specialist (ophthalmologist). HOME CARE INSTRUCTIONS   Keep your hands clean.  Use a clean towel each time you dry your eyelids. Do not use this towel to clean other areas. Do not share a towel or makeup with anyone.  Wash your eyelids with warm water or warm water mixed with a small amount of baby shampoo. Do this twice a day or as often as needed.  Wash your face and eyebrows at least once a day.  Use warm compresses 2 times a day for 10 minutes at a time, or as directed by your caregiver.  Apply antibiotic ointment as directed by your caregiver.  Avoid rubbing your eyes.  Avoid wearing makeup until you get better.  Follow up with your caregiver as directed. SEEK IMMEDIATE MEDICAL CARE IF:   You have pain, redness, or swelling that gets worse or spreads to other parts of your face.  Your vision changes, or you have pain when looking at lights or moving objects.  You have a fever.  Your symptoms continue for longer than 2 to 4 days or become worse. MAKE SURE YOU:   Understand these instructions.  Will watch your condition.  Will get help right away if you are not doing well or get worse. Document Released: 07/25/2000 Document Revised: 10/20/2011 Document Reviewed: 09/04/2010 Exit[employer] Patient Information 2015 [employer]. This information is not intended to replace advice given to you by your health care  provider. Make sure you discuss any questions you have with your health care provider.  
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
## 2015-01-29 ENCOUNTER — Telehealth: Payer: Self-pay

## 2015-01-29 NOTE — Telephone Encounter (Signed)
Call report rcvd from [facility].  Sent to scan.

## 2015-02-09 ENCOUNTER — Telehealth: Payer: Self-pay

## 2015-02-09 NOTE — Telephone Encounter (Signed)
Consult report dtd 01/18/15 rcvd from [facility].  Reviewed by Dr. Lindi Adie. Sent to scan.

## 2015-02-13 ENCOUNTER — Encounter: Payer: Self-pay | Admitting: Hematology and Oncology

## 2015-02-18 ENCOUNTER — Encounter: Payer: Self-pay | Admitting: Hematology and Oncology

## 2015-03-16 ENCOUNTER — Encounter: Payer: Self-pay | Admitting: Hematology and Oncology

## 2015-03-19 ENCOUNTER — Ambulatory Visit (HOSPITAL_BASED_OUTPATIENT_CLINIC_OR_DEPARTMENT_OTHER): Payer: Medicare Other

## 2015-03-19 ENCOUNTER — Other Ambulatory Visit: Payer: Self-pay | Admitting: *Deleted

## 2015-03-19 ENCOUNTER — Telehealth: Payer: Self-pay | Admitting: *Deleted

## 2015-03-19 ENCOUNTER — Other Ambulatory Visit: Payer: Self-pay

## 2015-03-19 ENCOUNTER — Telehealth: Payer: Self-pay | Admitting: Hematology and Oncology

## 2015-03-19 DIAGNOSIS — C787 Secondary malignant neoplasm of liver and intrahepatic bile duct: Secondary | ICD-10-CM

## 2015-03-19 DIAGNOSIS — C679 Malignant neoplasm of bladder, unspecified: Secondary | ICD-10-CM

## 2015-03-19 DIAGNOSIS — C799 Secondary malignant neoplasm of unspecified site: Secondary | ICD-10-CM

## 2015-03-19 LAB — CBC WITH DIFFERENTIAL/PLATELET
BASO%: 0.2 % (ref 0.0–2.0)
Basophils Absolute: 0 10*3/uL (ref 0.0–0.1)
EOS ABS: 0.2 10*3/uL (ref 0.0–0.5)
EOS%: 3.5 % (ref 0.0–7.0)
HCT: 29.2 % — ABNORMAL LOW (ref 38.4–49.9)
HGB: 9.7 g/dL — ABNORMAL LOW (ref 13.0–17.1)
LYMPH#: 0.8 10*3/uL — AB (ref 0.9–3.3)
LYMPH%: 12.7 % — ABNORMAL LOW (ref 14.0–49.0)
MCH: 29.5 pg (ref 27.2–33.4)
MCHC: 33.2 g/dL (ref 32.0–36.0)
MCV: 88.8 fL (ref 79.3–98.0)
MONO#: 0.8 10*3/uL (ref 0.1–0.9)
MONO%: 13.7 % (ref 0.0–14.0)
NEUT%: 69.9 % (ref 39.0–75.0)
NEUTROS ABS: 4.2 10*3/uL (ref 1.5–6.5)
PLATELETS: 143 10*3/uL (ref 140–400)
RBC: 3.29 10*6/uL — AB (ref 4.20–5.82)
RDW: 15.2 % — AB (ref 11.0–14.6)
WBC: 6 10*3/uL (ref 4.0–10.3)

## 2015-03-19 LAB — COMPREHENSIVE METABOLIC PANEL (CC13)
ALK PHOS: 89 U/L (ref 40–150)
ALT: 13 U/L (ref 0–55)
ANION GAP: 9 meq/L (ref 3–11)
AST: 25 U/L (ref 5–34)
Albumin: 3.2 g/dL — ABNORMAL LOW (ref 3.5–5.0)
BILIRUBIN TOTAL: 0.67 mg/dL (ref 0.20–1.20)
BUN: 14.4 mg/dL (ref 7.0–26.0)
CO2: 24 mEq/L (ref 22–29)
CREATININE: 1.3 mg/dL (ref 0.7–1.3)
Calcium: 8.5 mg/dL (ref 8.4–10.4)
Chloride: 98 mEq/L (ref 98–109)
EGFR: 52 mL/min/{1.73_m2} — ABNORMAL LOW (ref >90)
Glucose: 101 mg/dl (ref 70–140)
POTASSIUM: 4 meq/L (ref 3.5–5.1)
Sodium: 131 mEq/L — ABNORMAL LOW (ref 136–145)
Total Protein: 6.8 g/dL (ref 6.4–8.3)

## 2015-03-19 LAB — PHOSPHORUS: PHOSPHORUS: 3.2 mg/dL (ref 2.1–4.3)

## 2015-03-19 NOTE — Telephone Encounter (Signed)
VM message received from pt regarding having labs done here today. Pt currently receiving treatment @ [facility] but needs Phosporus level done today. Has prescription for it. POF and lab order entered.  TC back to patient to inform him that he can come in this am for labs. Needs results faxed to [phone] to Dr. Ruthe Mannan attention

## 2015-03-19 NOTE — Telephone Encounter (Signed)
Spoke with patient and he is on his way for lab only

## 2015-03-20 ENCOUNTER — Telehealth: Payer: Self-pay

## 2015-03-20 NOTE — Telephone Encounter (Signed)
Consult note [facility]cvd from [facility].  Reviewed by Dr. Lindi Adie.  Sent to scan.

## 2015-03-20 NOTE — Telephone Encounter (Signed)
Phosphorus results faxed to Dr Aline Brochure at [facility].  Sent to scan.

## 2015-03-27 ENCOUNTER — Other Ambulatory Visit: Payer: Self-pay | Admitting: *Deleted

## 2015-03-27 ENCOUNTER — Telehealth: Payer: Self-pay | Admitting: Hematology and Oncology

## 2015-03-27 DIAGNOSIS — C787 Secondary malignant neoplasm of liver and intrahepatic bile duct: Principal | ICD-10-CM

## 2015-03-27 DIAGNOSIS — C679 Malignant neoplasm of bladder, unspecified: Secondary | ICD-10-CM

## 2015-03-27 NOTE — Telephone Encounter (Signed)
Lft msg for pt confirming labs added on per 08/16 POF for 08/17.Marland KitchenMarland KitchenKJ

## 2015-03-28 ENCOUNTER — Other Ambulatory Visit (HOSPITAL_COMMUNITY)
Admission: RE | Admit: 2015-03-28 | Discharge: 2015-03-28 | Disposition: A | Discharge status: Home / Self Care | Payer: Medicare Other | Source: Ambulatory Visit | Attending: Hematology and Oncology | Admitting: Hematology and Oncology

## 2015-03-28 ENCOUNTER — Other Ambulatory Visit (HOSPITAL_BASED_OUTPATIENT_CLINIC_OR_DEPARTMENT_OTHER): Payer: Medicare Other

## 2015-03-28 DIAGNOSIS — C787 Secondary malignant neoplasm of liver and intrahepatic bile duct: Secondary | ICD-10-CM | POA: Diagnosis not present

## 2015-03-28 DIAGNOSIS — C679 Malignant neoplasm of bladder, unspecified: Secondary | ICD-10-CM | POA: Insufficient documentation

## 2015-03-28 LAB — COMPREHENSIVE METABOLIC PANEL (CC13)
ALBUMIN: 3.2 g/dL — AB (ref 3.5–5.0)
ALK PHOS: 96 U/L (ref 40–150)
ALT: 19 U/L (ref 0–55)
ANION GAP: 9 meq/L (ref 3–11)
AST: 25 U/L (ref 5–34)
BILIRUBIN TOTAL: 0.54 mg/dL (ref 0.20–1.20)
BUN: 13.4 mg/dL (ref 7.0–26.0)
CALCIUM: 8.9 mg/dL (ref 8.4–10.4)
CO2: 24 meq/L (ref 22–29)
CREATININE: 1.4 mg/dL — AB (ref 0.7–1.3)
Chloride: 99 mEq/L (ref 98–109)
EGFR: 48 mL/min/{1.73_m2} — AB (ref >90)
Glucose: 105 mg/dl (ref 70–140)
Potassium: 4.4 mEq/L (ref 3.5–5.1)
Sodium: 132 mEq/L — ABNORMAL LOW (ref 136–145)
TOTAL PROTEIN: 6.9 g/dL (ref 6.4–8.3)

## 2015-03-28 LAB — CBC WITH DIFFERENTIAL/PLATELET
BASO%: 0.3 % (ref 0.0–2.0)
Basophils Absolute: 0 10*3/uL (ref 0.0–0.1)
EOS ABS: 0.3 10*3/uL (ref 0.0–0.5)
EOS%: 7.1 % — ABNORMAL HIGH (ref 0.0–7.0)
HCT: 28.8 % — ABNORMAL LOW (ref 38.4–49.9)
HEMOGLOBIN: 9.6 g/dL — AB (ref 13.0–17.1)
LYMPH%: 17.8 % (ref 14.0–49.0)
MCH: 29.4 pg (ref 27.2–33.4)
MCHC: 33.2 g/dL (ref 32.0–36.0)
MCV: 88.5 fL (ref 79.3–98.0)
MONO#: 0.6 10*3/uL (ref 0.1–0.9)
MONO%: 14.3 % — ABNORMAL HIGH (ref 0.0–14.0)
NEUT#: 2.6 10*3/uL (ref 1.5–6.5)
NEUT%: 60.5 % (ref 39.0–75.0)
PLATELETS: 262 10*3/uL (ref 140–400)
RBC: 3.26 10*6/uL — ABNORMAL LOW (ref 4.20–5.82)
RDW: 16.6 % — ABNORMAL HIGH (ref 11.0–14.6)
WBC: 4.3 10*3/uL (ref 4.0–10.3)
lymph#: 0.8 10*3/uL — ABNORMAL LOW (ref 0.9–3.3)

## 2015-03-28 LAB — PHOSPHORUS: PHOSPHORUS: 5.9 mg/dL — AB (ref 2.5–4.6)

## 2015-04-04 ENCOUNTER — Telehealth: Payer: Self-pay | Admitting: Hematology and Oncology

## 2015-04-04 ENCOUNTER — Other Ambulatory Visit (HOSPITAL_BASED_OUTPATIENT_CLINIC_OR_DEPARTMENT_OTHER): Payer: Medicare Other

## 2015-04-04 ENCOUNTER — Other Ambulatory Visit: Payer: Self-pay | Admitting: Adult Health

## 2015-04-04 ENCOUNTER — Other Ambulatory Visit: Payer: Self-pay | Admitting: *Deleted

## 2015-04-04 ENCOUNTER — Other Ambulatory Visit (HOSPITAL_COMMUNITY)
Admission: RE | Admit: 2015-04-04 | Discharge: 2015-04-04 | Disposition: A | Discharge status: Home / Self Care | Payer: Medicare Other | Source: Ambulatory Visit | Attending: Hematology and Oncology | Admitting: Hematology and Oncology

## 2015-04-04 DIAGNOSIS — C799 Secondary malignant neoplasm of unspecified site: Secondary | ICD-10-CM

## 2015-04-04 DIAGNOSIS — C679 Malignant neoplasm of bladder, unspecified: Secondary | ICD-10-CM

## 2015-04-04 DIAGNOSIS — C787 Secondary malignant neoplasm of liver and intrahepatic bile duct: Secondary | ICD-10-CM | POA: Diagnosis not present

## 2015-04-04 LAB — CBC WITH DIFFERENTIAL/PLATELET
BASO%: 0.3 % (ref 0.0–2.0)
Basophils Absolute: 0 10*3/uL (ref 0.0–0.1)
EOS%: 6.5 % (ref 0.0–7.0)
Eosinophils Absolute: 0.3 10*3/uL (ref 0.0–0.5)
HCT: 28.6 % — ABNORMAL LOW (ref 38.4–49.9)
HGB: 9.3 g/dL — ABNORMAL LOW (ref 13.0–17.1)
LYMPH%: 16.8 % (ref 14.0–49.0)
MCH: 29 pg (ref 27.2–33.4)
MCHC: 32.4 g/dL (ref 32.0–36.0)
MCV: 89.5 fL (ref 79.3–98.0)
MONO#: 0.4 10*3/uL (ref 0.1–0.9)
MONO%: 11.1 % (ref 0.0–14.0)
NEUT%: 65.3 % (ref 39.0–75.0)
NEUTROS ABS: 2.6 10*3/uL (ref 1.5–6.5)
Platelets: 206 10*3/uL (ref 140–400)
RBC: 3.2 10*6/uL — AB (ref 4.20–5.82)
RDW: 16.8 % — ABNORMAL HIGH (ref 11.0–14.6)
WBC: 4 10*3/uL (ref 4.0–10.3)
lymph#: 0.7 10*3/uL — ABNORMAL LOW (ref 0.9–3.3)

## 2015-04-04 LAB — COMPREHENSIVE METABOLIC PANEL (CC13)
ALT: 15 U/L (ref 0–55)
ANION GAP: 8 meq/L (ref 3–11)
AST: 20 U/L (ref 5–34)
Albumin: 3.2 g/dL — ABNORMAL LOW (ref 3.5–5.0)
Alkaline Phosphatase: 105 U/L (ref 40–150)
BILIRUBIN TOTAL: 0.43 mg/dL (ref 0.20–1.20)
BUN: 15.1 mg/dL (ref 7.0–26.0)
CHLORIDE: 99 meq/L (ref 98–109)
CO2: 26 meq/L (ref 22–29)
CREATININE: 1.4 mg/dL — AB (ref 0.7–1.3)
Calcium: 9 mg/dL (ref 8.4–10.4)
EGFR: 48 mL/min/{1.73_m2} — AB (ref >90)
GLUCOSE: 118 mg/dL (ref 70–140)
Potassium: 4.5 mEq/L (ref 3.5–5.1)
SODIUM: 133 meq/L — AB (ref 136–145)
TOTAL PROTEIN: 6.7 g/dL (ref 6.4–8.3)

## 2015-04-04 LAB — PHOSPHORUS: Phosphorus: 5.9 mg/dL — ABNORMAL HIGH (ref 2.5–4.6)

## 2015-04-04 NOTE — Telephone Encounter (Signed)
s.w pt and advised on todays appt...ok and aware °

## 2015-04-17 ENCOUNTER — Other Ambulatory Visit: Payer: Self-pay

## 2015-04-17 DIAGNOSIS — C679 Malignant neoplasm of bladder, unspecified: Secondary | ICD-10-CM

## 2015-04-17 DIAGNOSIS — C787 Secondary malignant neoplasm of liver and intrahepatic bile duct: Principal | ICD-10-CM

## 2015-04-17 NOTE — Progress Notes (Signed)
Received request from [facility] to order a one time lab- phosphorus.  Lab order entered and a request for lab result to be faxed to [facility] at [phone].  Order sent to scanning.

## 2015-04-18 ENCOUNTER — Other Ambulatory Visit (HOSPITAL_BASED_OUTPATIENT_CLINIC_OR_DEPARTMENT_OTHER): Payer: Medicare Other

## 2015-04-18 ENCOUNTER — Telehealth: Payer: Self-pay

## 2015-04-18 DIAGNOSIS — C787 Secondary malignant neoplasm of liver and intrahepatic bile duct: Secondary | ICD-10-CM | POA: Diagnosis not present

## 2015-04-18 DIAGNOSIS — C679 Malignant neoplasm of bladder, unspecified: Secondary | ICD-10-CM | POA: Diagnosis not present

## 2015-04-18 DIAGNOSIS — C799 Secondary malignant neoplasm of unspecified site: Secondary | ICD-10-CM

## 2015-04-18 LAB — CBC WITH DIFFERENTIAL/PLATELET
BASO%: 0.3 % (ref 0.0–2.0)
Basophils Absolute: 0 10*3/uL (ref 0.0–0.1)
EOS ABS: 0.2 10*3/uL (ref 0.0–0.5)
EOS%: 3.9 % (ref 0.0–7.0)
HEMATOCRIT: 28.8 % — AB (ref 38.4–49.9)
HEMOGLOBIN: 9.5 g/dL — AB (ref 13.0–17.1)
LYMPH#: 0.6 10*3/uL — AB (ref 0.9–3.3)
LYMPH%: 12.5 % — ABNORMAL LOW (ref 14.0–49.0)
MCH: 29 pg (ref 27.2–33.4)
MCHC: 33 g/dL (ref 32.0–36.0)
MCV: 87.9 fL (ref 79.3–98.0)
MONO#: 0.7 10*3/uL (ref 0.1–0.9)
MONO%: 15.2 % — AB (ref 0.0–14.0)
NEUT%: 68.1 % (ref 39.0–75.0)
NEUTROS ABS: 3.3 10*3/uL (ref 1.5–6.5)
Platelets: 240 10*3/uL (ref 140–400)
RBC: 3.28 10*6/uL — ABNORMAL LOW (ref 4.20–5.82)
RDW: 16.9 % — AB (ref 11.0–14.6)
WBC: 4.9 10*3/uL (ref 4.0–10.3)

## 2015-04-18 LAB — COMPREHENSIVE METABOLIC PANEL (CC13)
ALBUMIN: 3.2 g/dL — AB (ref 3.5–5.0)
ALT: 14 U/L (ref 0–55)
AST: 24 U/L (ref 5–34)
Alkaline Phosphatase: 92 U/L (ref 40–150)
Anion Gap: 8 mEq/L (ref 3–11)
BILIRUBIN TOTAL: 0.5 mg/dL (ref 0.20–1.20)
BUN: 16.9 mg/dL (ref 7.0–26.0)
CO2: 26 meq/L (ref 22–29)
CREATININE: 1.3 mg/dL (ref 0.7–1.3)
Calcium: 9.1 mg/dL (ref 8.4–10.4)
Chloride: 98 mEq/L (ref 98–109)
EGFR: 51 mL/min/{1.73_m2} — ABNORMAL LOW (ref >90)
GLUCOSE: 97 mg/dL (ref 70–140)
Potassium: 4.5 mEq/L (ref 3.5–5.1)
SODIUM: 132 meq/L — AB (ref 136–145)
TOTAL PROTEIN: 7.1 g/dL (ref 6.4–8.3)

## 2015-04-18 LAB — PHOSPHORUS: PHOSPHORUS: 5.6 mg/dL — AB (ref 2.1–4.3)

## 2015-04-18 NOTE — Telephone Encounter (Signed)
Appt made for lab draw - phos for [facility].  Pt voiced understanding.  Lab notified.

## 2015-04-20 ENCOUNTER — Telehealth: Payer: Self-pay

## 2015-04-20 NOTE — Telephone Encounter (Signed)
Phosphorus results faxed to Dr. Ethelle Lyon at [facility] [phone].  Sent to scan.

## 2015-04-26 ENCOUNTER — Inpatient Hospital Stay (HOSPITAL_COMMUNITY)
Admission: EM | Admit: 2015-04-26 | Discharge: 2015-05-04 | DRG: 864 | Disposition: A | Discharge status: Hospice | Payer: Medicare Other | Attending: Internal Medicine | Admitting: Internal Medicine

## 2015-04-26 ENCOUNTER — Emergency Department (HOSPITAL_COMMUNITY): Payer: Medicare Other

## 2015-04-26 ENCOUNTER — Encounter (HOSPITAL_COMMUNITY): Payer: Self-pay | Admitting: Emergency Medicine

## 2015-04-26 DIAGNOSIS — F329 Major depressive disorder, single episode, unspecified: Secondary | ICD-10-CM | POA: Diagnosis present

## 2015-04-26 DIAGNOSIS — J019 Acute sinusitis, unspecified: Secondary | ICD-10-CM | POA: Diagnosis present

## 2015-04-26 DIAGNOSIS — Z955 Presence of coronary angioplasty implant and graft: Secondary | ICD-10-CM

## 2015-04-26 DIAGNOSIS — R502 Drug induced fever: Principal | ICD-10-CM | POA: Diagnosis present

## 2015-04-26 DIAGNOSIS — T368X5A Adverse effect of other systemic antibiotics, initial encounter: Secondary | ICD-10-CM | POA: Diagnosis not present

## 2015-04-26 DIAGNOSIS — R509 Fever, unspecified: Secondary | ICD-10-CM | POA: Diagnosis not present

## 2015-04-26 DIAGNOSIS — K1231 Oral mucositis (ulcerative) due to antineoplastic therapy: Secondary | ICD-10-CM

## 2015-04-26 DIAGNOSIS — C787 Secondary malignant neoplasm of liver and intrahepatic bile duct: Secondary | ICD-10-CM | POA: Diagnosis present

## 2015-04-26 DIAGNOSIS — Z515 Encounter for palliative care: Secondary | ICD-10-CM | POA: Insufficient documentation

## 2015-04-26 DIAGNOSIS — L270 Generalized skin eruption due to drugs and medicaments taken internally: Secondary | ICD-10-CM | POA: Diagnosis not present

## 2015-04-26 DIAGNOSIS — L930 Discoid lupus erythematosus: Secondary | ICD-10-CM | POA: Diagnosis present

## 2015-04-26 DIAGNOSIS — Z66 Do not resuscitate: Secondary | ICD-10-CM | POA: Diagnosis present

## 2015-04-26 DIAGNOSIS — M1990 Unspecified osteoarthritis, unspecified site: Secondary | ICD-10-CM | POA: Diagnosis present

## 2015-04-26 DIAGNOSIS — G4730 Sleep apnea, unspecified: Secondary | ICD-10-CM | POA: Diagnosis present

## 2015-04-26 DIAGNOSIS — R531 Weakness: Secondary | ICD-10-CM | POA: Diagnosis present

## 2015-04-26 DIAGNOSIS — E871 Hypo-osmolality and hyponatremia: Secondary | ICD-10-CM | POA: Diagnosis present

## 2015-04-26 DIAGNOSIS — D638 Anemia in other chronic diseases classified elsewhere: Secondary | ICD-10-CM | POA: Diagnosis present

## 2015-04-26 DIAGNOSIS — R530 Neoplastic (malignant) related fatigue: Secondary | ICD-10-CM | POA: Diagnosis present

## 2015-04-26 DIAGNOSIS — F319 Bipolar disorder, unspecified: Secondary | ICD-10-CM | POA: Diagnosis present

## 2015-04-26 DIAGNOSIS — Z79891 Long term (current) use of opiate analgesic: Secondary | ICD-10-CM

## 2015-04-26 DIAGNOSIS — I252 Old myocardial infarction: Secondary | ICD-10-CM

## 2015-04-26 DIAGNOSIS — I4892 Unspecified atrial flutter: Secondary | ICD-10-CM | POA: Diagnosis present

## 2015-04-26 DIAGNOSIS — Z885 Allergy status to narcotic agent status: Secondary | ICD-10-CM

## 2015-04-26 DIAGNOSIS — Z91048 Other nonmedicinal substance allergy status: Secondary | ICD-10-CM

## 2015-04-26 DIAGNOSIS — Z79899 Other long term (current) drug therapy: Secondary | ICD-10-CM

## 2015-04-26 DIAGNOSIS — C7951 Secondary malignant neoplasm of bone: Secondary | ICD-10-CM | POA: Diagnosis present

## 2015-04-26 DIAGNOSIS — D649 Anemia, unspecified: Secondary | ICD-10-CM | POA: Diagnosis present

## 2015-04-26 DIAGNOSIS — C7972 Secondary malignant neoplasm of left adrenal gland: Secondary | ICD-10-CM | POA: Diagnosis present

## 2015-04-26 DIAGNOSIS — Z888 Allergy status to other drugs, medicaments and biological substances status: Secondary | ICD-10-CM

## 2015-04-26 DIAGNOSIS — I2510 Atherosclerotic heart disease of native coronary artery without angina pectoris: Secondary | ICD-10-CM | POA: Diagnosis present

## 2015-04-26 DIAGNOSIS — L932 Other local lupus erythematosus: Secondary | ICD-10-CM | POA: Diagnosis present

## 2015-04-26 DIAGNOSIS — C679 Malignant neoplasm of bladder, unspecified: Secondary | ICD-10-CM | POA: Diagnosis present

## 2015-04-26 DIAGNOSIS — N179 Acute kidney failure, unspecified: Secondary | ICD-10-CM

## 2015-04-26 DIAGNOSIS — E43 Unspecified severe protein-calorie malnutrition: Secondary | ICD-10-CM

## 2015-04-26 DIAGNOSIS — G893 Neoplasm related pain (acute) (chronic): Secondary | ICD-10-CM | POA: Diagnosis present

## 2015-04-26 DIAGNOSIS — C799 Secondary malignant neoplasm of unspecified site: Secondary | ICD-10-CM

## 2015-04-26 DIAGNOSIS — G4733 Obstructive sleep apnea (adult) (pediatric): Secondary | ICD-10-CM | POA: Diagnosis present

## 2015-04-26 DIAGNOSIS — R6510 Systemic inflammatory response syndrome (SIRS) of non-infectious origin without acute organ dysfunction: Secondary | ICD-10-CM

## 2015-04-26 DIAGNOSIS — E785 Hyperlipidemia, unspecified: Secondary | ICD-10-CM | POA: Diagnosis present

## 2015-04-26 DIAGNOSIS — Z8249 Family history of ischemic heart disease and other diseases of the circulatory system: Secondary | ICD-10-CM

## 2015-04-26 DIAGNOSIS — F32A Depression, unspecified: Secondary | ICD-10-CM | POA: Diagnosis present

## 2015-04-26 DIAGNOSIS — T451X5A Adverse effect of antineoplastic and immunosuppressive drugs, initial encounter: Secondary | ICD-10-CM | POA: Diagnosis present

## 2015-04-26 DIAGNOSIS — Z87891 Personal history of nicotine dependence: Secondary | ICD-10-CM

## 2015-04-26 DIAGNOSIS — Z6833 Body mass index (BMI) 33.0-33.9, adult: Secondary | ICD-10-CM

## 2015-04-26 DIAGNOSIS — A419 Sepsis, unspecified organism: Secondary | ICD-10-CM | POA: Diagnosis present

## 2015-04-26 LAB — COMPREHENSIVE METABOLIC PANEL
ALT: 13 U/L — AB (ref 17–63)
AST: 24 U/L (ref 15–41)
Albumin: 3.4 g/dL — ABNORMAL LOW (ref 3.5–5.0)
Alkaline Phosphatase: 96 U/L (ref 38–126)
Anion gap: 8 (ref 5–15)
BILIRUBIN TOTAL: 0.6 mg/dL (ref 0.3–1.2)
BUN: 15 mg/dL (ref 6–20)
CALCIUM: 9 mg/dL (ref 8.9–10.3)
CHLORIDE: 99 mmol/L — AB (ref 101–111)
CO2: 24 mmol/L (ref 22–32)
Creatinine, Ser: 1.16 mg/dL (ref 0.61–1.24)
GFR, EST NON AFRICAN AMERICAN: 59 mL/min — AB (ref >60)
GLUCOSE: 117 mg/dL — AB (ref 65–99)
Potassium: 4.2 mmol/L (ref 3.5–5.1)
Sodium: 131 mmol/L — ABNORMAL LOW (ref 135–145)
Total Protein: 7.7 g/dL (ref 6.5–8.1)

## 2015-04-26 LAB — CBC WITH DIFFERENTIAL/PLATELET
Basophils Absolute: 0 10*3/uL (ref 0.0–0.1)
Basophils Relative: 0 %
EOS PCT: 1 %
Eosinophils Absolute: 0.1 10*3/uL (ref 0.0–0.7)
HEMATOCRIT: 32.6 % — AB (ref 39.0–52.0)
Hemoglobin: 10.3 g/dL — ABNORMAL LOW (ref 13.0–17.0)
LYMPHS ABS: 0.7 10*3/uL (ref 0.7–4.0)
LYMPHS PCT: 10 %
MCH: 27.7 pg (ref 26.0–34.0)
MCHC: 31.6 g/dL (ref 30.0–36.0)
MCV: 87.6 fL (ref 78.0–100.0)
MONO ABS: 0.7 10*3/uL (ref 0.1–1.0)
Monocytes Relative: 10 %
NEUTROS ABS: 5.6 10*3/uL (ref 1.7–7.7)
Neutrophils Relative %: 79 %
PLATELETS: 238 10*3/uL (ref 150–400)
RBC: 3.72 MIL/uL — AB (ref 4.22–5.81)
RDW: 15.7 % — AB (ref 11.5–15.5)
WBC: 7.2 10*3/uL (ref 4.0–10.5)

## 2015-04-26 LAB — I-STAT CG4 LACTIC ACID, ED: Lactic Acid, Venous: 1.12 mmol/L (ref 0.5–2.0)

## 2015-04-26 MED ORDER — PIPERACILLIN-TAZOBACTAM 3.375 G IVPB 30 MIN
3.3750 g | Freq: Once | INTRAVENOUS | Status: AC
  Administered 2015-04-27: 3.375 g via INTRAVENOUS
  Filled 2015-04-26: qty 50

## 2015-04-26 MED ORDER — ACETAMINOPHEN 325 MG PO TABS
650.0000 mg | ORAL_TABLET | Freq: Once | ORAL | Status: AC
  Administered 2015-04-26: 650 mg via ORAL
  Filled 2015-04-26: qty 2

## 2015-04-26 MED ORDER — SODIUM CHLORIDE 0.9 % IV BOLUS (SEPSIS)
500.0000 mL | Freq: Once | INTRAVENOUS | Status: AC
Start: 2015-04-26 — End: 2015-04-27
  Administered 2015-04-27: 500 mL via INTRAVENOUS

## 2015-04-26 MED ORDER — VANCOMYCIN HCL IN DEXTROSE 1-5 GM/200ML-% IV SOLN
1000.0000 mg | Freq: Once | INTRAVENOUS | Status: AC
  Administered 2015-04-27: 1000 mg via INTRAVENOUS
  Filled 2015-04-26: qty 200

## 2015-04-26 NOTE — ED Notes (Signed)
Pt c/o elevated temp since this am.  C/o pain in mid back. Productive cough. Pt is currently receiving Chemo
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
## 2015-04-27 ENCOUNTER — Encounter (HOSPITAL_COMMUNITY): Payer: Self-pay | Admitting: Family Medicine

## 2015-04-27 DIAGNOSIS — T451X5A Adverse effect of antineoplastic and immunosuppressive drugs, initial encounter: Secondary | ICD-10-CM | POA: Diagnosis present

## 2015-04-27 DIAGNOSIS — C7951 Secondary malignant neoplasm of bone: Secondary | ICD-10-CM | POA: Diagnosis present

## 2015-04-27 DIAGNOSIS — I483 Typical atrial flutter: Secondary | ICD-10-CM | POA: Diagnosis not present

## 2015-04-27 DIAGNOSIS — I484 Atypical atrial flutter: Secondary | ICD-10-CM | POA: Diagnosis not present

## 2015-04-27 DIAGNOSIS — C787 Secondary malignant neoplasm of liver and intrahepatic bile duct: Secondary | ICD-10-CM | POA: Diagnosis present

## 2015-04-27 DIAGNOSIS — Z91048 Other nonmedicinal substance allergy status: Secondary | ICD-10-CM | POA: Diagnosis not present

## 2015-04-27 DIAGNOSIS — Z79899 Other long term (current) drug therapy: Secondary | ICD-10-CM | POA: Diagnosis not present

## 2015-04-27 DIAGNOSIS — E43 Unspecified severe protein-calorie malnutrition: Secondary | ICD-10-CM | POA: Diagnosis present

## 2015-04-27 DIAGNOSIS — L932 Other local lupus erythematosus: Secondary | ICD-10-CM | POA: Diagnosis not present

## 2015-04-27 DIAGNOSIS — Z79891 Long term (current) use of opiate analgesic: Secondary | ICD-10-CM | POA: Diagnosis not present

## 2015-04-27 DIAGNOSIS — Z515 Encounter for palliative care: Secondary | ICD-10-CM | POA: Diagnosis not present

## 2015-04-27 DIAGNOSIS — K1230 Oral mucositis (ulcerative), unspecified: Secondary | ICD-10-CM | POA: Diagnosis not present

## 2015-04-27 DIAGNOSIS — C679 Malignant neoplasm of bladder, unspecified: Secondary | ICD-10-CM | POA: Diagnosis present

## 2015-04-27 DIAGNOSIS — C799 Secondary malignant neoplasm of unspecified site: Secondary | ICD-10-CM | POA: Diagnosis not present

## 2015-04-27 DIAGNOSIS — G893 Neoplasm related pain (acute) (chronic): Secondary | ICD-10-CM | POA: Diagnosis not present

## 2015-04-27 DIAGNOSIS — L270 Generalized skin eruption due to drugs and medicaments taken internally: Secondary | ICD-10-CM | POA: Diagnosis not present

## 2015-04-27 DIAGNOSIS — R502 Drug induced fever: Secondary | ICD-10-CM | POA: Diagnosis present

## 2015-04-27 DIAGNOSIS — F329 Major depressive disorder, single episode, unspecified: Secondary | ICD-10-CM | POA: Diagnosis not present

## 2015-04-27 DIAGNOSIS — Z66 Do not resuscitate: Secondary | ICD-10-CM | POA: Diagnosis present

## 2015-04-27 DIAGNOSIS — I2510 Atherosclerotic heart disease of native coronary artery without angina pectoris: Secondary | ICD-10-CM | POA: Diagnosis present

## 2015-04-27 DIAGNOSIS — F319 Bipolar disorder, unspecified: Secondary | ICD-10-CM | POA: Diagnosis present

## 2015-04-27 DIAGNOSIS — T368X5A Adverse effect of other systemic antibiotics, initial encounter: Secondary | ICD-10-CM | POA: Diagnosis not present

## 2015-04-27 DIAGNOSIS — Z6833 Body mass index (BMI) 33.0-33.9, adult: Secondary | ICD-10-CM | POA: Diagnosis not present

## 2015-04-27 DIAGNOSIS — R531 Weakness: Secondary | ICD-10-CM | POA: Diagnosis present

## 2015-04-27 DIAGNOSIS — N179 Acute kidney failure, unspecified: Secondary | ICD-10-CM | POA: Diagnosis not present

## 2015-04-27 DIAGNOSIS — C7800 Secondary malignant neoplasm of unspecified lung: Secondary | ICD-10-CM | POA: Diagnosis not present

## 2015-04-27 DIAGNOSIS — J019 Acute sinusitis, unspecified: Secondary | ICD-10-CM | POA: Diagnosis present

## 2015-04-27 DIAGNOSIS — I4892 Unspecified atrial flutter: Secondary | ICD-10-CM | POA: Diagnosis present

## 2015-04-27 DIAGNOSIS — R509 Fever, unspecified: Secondary | ICD-10-CM | POA: Diagnosis present

## 2015-04-27 DIAGNOSIS — I252 Old myocardial infarction: Secondary | ICD-10-CM | POA: Diagnosis not present

## 2015-04-27 DIAGNOSIS — A419 Sepsis, unspecified organism: Secondary | ICD-10-CM | POA: Diagnosis not present

## 2015-04-27 DIAGNOSIS — K1231 Oral mucositis (ulcerative) due to antineoplastic therapy: Secondary | ICD-10-CM | POA: Diagnosis not present

## 2015-04-27 DIAGNOSIS — E871 Hypo-osmolality and hyponatremia: Secondary | ICD-10-CM | POA: Diagnosis present

## 2015-04-27 DIAGNOSIS — Z8249 Family history of ischemic heart disease and other diseases of the circulatory system: Secondary | ICD-10-CM | POA: Diagnosis not present

## 2015-04-27 DIAGNOSIS — D638 Anemia in other chronic diseases classified elsewhere: Secondary | ICD-10-CM | POA: Diagnosis present

## 2015-04-27 DIAGNOSIS — E785 Hyperlipidemia, unspecified: Secondary | ICD-10-CM | POA: Diagnosis present

## 2015-04-27 DIAGNOSIS — L930 Discoid lupus erythematosus: Secondary | ICD-10-CM | POA: Diagnosis present

## 2015-04-27 DIAGNOSIS — Z87891 Personal history of nicotine dependence: Secondary | ICD-10-CM | POA: Diagnosis not present

## 2015-04-27 DIAGNOSIS — M1990 Unspecified osteoarthritis, unspecified site: Secondary | ICD-10-CM | POA: Diagnosis present

## 2015-04-27 DIAGNOSIS — C7972 Secondary malignant neoplasm of left adrenal gland: Secondary | ICD-10-CM | POA: Diagnosis present

## 2015-04-27 DIAGNOSIS — Z885 Allergy status to narcotic agent status: Secondary | ICD-10-CM | POA: Diagnosis not present

## 2015-04-27 DIAGNOSIS — Z960 Presence of urogenital implants: Secondary | ICD-10-CM | POA: Diagnosis not present

## 2015-04-27 DIAGNOSIS — G4733 Obstructive sleep apnea (adult) (pediatric): Secondary | ICD-10-CM | POA: Diagnosis present

## 2015-04-27 DIAGNOSIS — Z888 Allergy status to other drugs, medicaments and biological substances status: Secondary | ICD-10-CM | POA: Diagnosis not present

## 2015-04-27 DIAGNOSIS — Z955 Presence of coronary angioplasty implant and graft: Secondary | ICD-10-CM | POA: Diagnosis not present

## 2015-04-27 LAB — URINE MICROSCOPIC-ADD ON

## 2015-04-27 LAB — URINALYSIS, ROUTINE W REFLEX MICROSCOPIC
Bilirubin Urine: NEGATIVE
GLUCOSE, UA: NEGATIVE mg/dL
Ketones, ur: NEGATIVE mg/dL
LEUKOCYTES UA: NEGATIVE
NITRITE: NEGATIVE
PH: 7 (ref 5.0–8.0)
Protein, ur: NEGATIVE mg/dL
SPECIFIC GRAVITY, URINE: 1.015 (ref 1.005–1.030)
Urobilinogen, UA: 1 mg/dL (ref 0.0–1.0)

## 2015-04-27 LAB — CBC
HEMATOCRIT: 29.2 % — AB (ref 39.0–52.0)
HEMOGLOBIN: 9.4 g/dL — AB (ref 13.0–17.0)
MCH: 27.9 pg (ref 26.0–34.0)
MCHC: 32.2 g/dL (ref 30.0–36.0)
MCV: 86.6 fL (ref 78.0–100.0)
Platelets: 200 10*3/uL (ref 150–400)
RBC: 3.37 MIL/uL — ABNORMAL LOW (ref 4.22–5.81)
RDW: 15.8 % — AB (ref 11.5–15.5)
WBC: 6.1 10*3/uL (ref 4.0–10.5)

## 2015-04-27 LAB — BASIC METABOLIC PANEL
Anion gap: 9 (ref 5–15)
BUN: 16 mg/dL (ref 6–20)
CHLORIDE: 96 mmol/L — AB (ref 101–111)
CO2: 23 mmol/L (ref 22–32)
CREATININE: 1.19 mg/dL (ref 0.61–1.24)
Calcium: 8.3 mg/dL — ABNORMAL LOW (ref 8.9–10.3)
GFR calc non Af Amer: 57 mL/min — ABNORMAL LOW (ref >60)
GLUCOSE: 122 mg/dL — AB (ref 65–99)
Potassium: 4 mmol/L (ref 3.5–5.1)
Sodium: 128 mmol/L — ABNORMAL LOW (ref 135–145)

## 2015-04-27 MED ORDER — BISACODYL 10 MG RE SUPP: 10.0000 mg | Freq: Every day | RECTAL | Status: DC | PRN

## 2015-04-27 MED ORDER — VANCOMYCIN HCL IN DEXTROSE 750-5 MG/150ML-% IV SOLN
750.0000 mg | Freq: Two times a day (BID) | INTRAVENOUS | Status: DC
  Administered 2015-04-27 – 2015-04-28 (×3): 750 mg via INTRAVENOUS
  Filled 2015-04-27 (×4): qty 150

## 2015-04-27 MED ORDER — OXYCODONE HCL 5 MG PO TABS
5.0000 mg | ORAL_TABLET | Freq: Four times a day (QID) | ORAL | Status: DC | PRN
  Administered 2015-04-28 – 2015-05-01 (×6): 5 mg via ORAL
  Filled 2015-04-27 (×6): qty 1

## 2015-04-27 MED ORDER — MORPHINE SULFATE ER 15 MG PO TBCR
15.0000 mg | EXTENDED_RELEASE_TABLET | Freq: Two times a day (BID) | ORAL | Status: DC
  Administered 2015-04-27 – 2015-05-02 (×11): 15 mg via ORAL
  Filled 2015-04-27 (×11): qty 1

## 2015-04-27 MED ORDER — OXYCODONE HCL 5 MG PO TABS
5.0000 mg | ORAL_TABLET | Freq: Two times a day (BID) | ORAL | Status: DC | PRN
  Administered 2015-04-27: 5 mg via ORAL
  Filled 2015-04-27 (×2): qty 1

## 2015-04-27 MED ORDER — ENSURE ENLIVE PO LIQD
237.0000 mL | Freq: Two times a day (BID) | ORAL | Status: DC
  Administered 2015-04-28 – 2015-05-01 (×6): 237 mL via ORAL

## 2015-04-27 MED ORDER — CARVEDILOL 6.25 MG PO TABS
6.2500 mg | ORAL_TABLET | Freq: Two times a day (BID) | ORAL | Status: DC
  Administered 2015-04-27 – 2015-05-04 (×14): 6.25 mg via ORAL
  Filled 2015-04-27 (×14): qty 1

## 2015-04-27 MED ORDER — OXYCODONE HCL 5 MG PO TABS
5.0000 mg | ORAL_TABLET | Freq: Four times a day (QID) | ORAL | Status: DC | PRN
  Administered 2015-04-27: 5 mg via ORAL
  Filled 2015-04-27: qty 1

## 2015-04-27 MED ORDER — PIPERACILLIN-TAZOBACTAM 3.375 G IVPB
3.3750 g | Freq: Three times a day (TID) | INTRAVENOUS | Status: DC
  Administered 2015-04-27 – 2015-04-28 (×5): 3.375 g via INTRAVENOUS
  Filled 2015-04-27 (×7): qty 50

## 2015-04-27 MED ORDER — BIOTENE DRY MOUTH MT LIQD: 15.0000 mL | OROMUCOSAL | Status: DC | PRN

## 2015-04-27 MED ORDER — PROCHLORPERAZINE MALEATE 10 MG PO TABS
10.0000 mg | ORAL_TABLET | Freq: Four times a day (QID) | ORAL | Status: DC | PRN
  Administered 2015-04-27 – 2015-05-03 (×6): 10 mg via ORAL
  Filled 2015-04-27 (×16): qty 1

## 2015-04-27 MED ORDER — ENOXAPARIN SODIUM 40 MG/0.4ML ~~LOC~~ SOLN
40.0000 mg | Freq: Every day | SUBCUTANEOUS | Status: DC
  Administered 2015-04-27 – 2015-05-04 (×8): 40 mg via SUBCUTANEOUS
  Filled 2015-04-27 (×8): qty 0.4

## 2015-04-27 MED ORDER — OXYCODONE HCL 5 MG PO TABS: 5.0000 mg | ORAL_TABLET | ORAL | Status: DC | PRN

## 2015-04-27 MED ORDER — ACETAMINOPHEN 325 MG PO TABS
650.0000 mg | ORAL_TABLET | Freq: Four times a day (QID) | ORAL | Status: AC | PRN
Start: 2015-04-27 — End: 2015-04-28
  Administered 2015-04-27 – 2015-04-28 (×2): 650 mg via ORAL
  Filled 2015-04-27 (×2): qty 2

## 2015-04-27 MED ORDER — OXYCODONE HCL 5 MG PO TABS
5.0000 mg | ORAL_TABLET | Freq: Once | ORAL | Status: AC
  Administered 2015-04-27: 5 mg via ORAL
  Filled 2015-04-27: qty 1

## 2015-04-27 MED ORDER — MAGIC MOUTHWASH W/LIDOCAINE
5.0000 mL | Freq: Three times a day (TID) | ORAL | Status: DC | PRN
  Filled 2015-04-27: qty 5

## 2015-04-27 MED ORDER — SODIUM CHLORIDE 0.9 % IJ SOLN
10.0000 mL | INTRAMUSCULAR | Status: DC | PRN
  Administered 2015-04-27 – 2015-05-04 (×6): 10 mL
  Filled 2015-04-27 (×6): qty 40

## 2015-04-27 MED ORDER — RISAQUAD PO CAPS
1.0000 | ORAL_CAPSULE | Freq: Every day | ORAL | Status: DC
Start: 2015-04-27 — End: 2015-05-04
  Administered 2015-04-27 – 2015-05-04 (×8): 1 via ORAL
  Filled 2015-04-27 (×8): qty 1

## 2015-04-27 MED ORDER — BOOST / RESOURCE BREEZE PO LIQD
1.0000 | Freq: Two times a day (BID) | ORAL | Status: DC
  Administered 2015-05-02: 1 via ORAL

## 2015-04-27 MED ORDER — SERTRALINE HCL 100 MG PO TABS
150.0000 mg | ORAL_TABLET | Freq: Every day | ORAL | Status: DC
Start: 2015-04-27 — End: 2015-05-04
  Administered 2015-04-27 – 2015-05-04 (×8): 150 mg via ORAL
  Filled 2015-04-27 (×16): qty 1

## 2015-04-27 MED ORDER — POLYETHYLENE GLYCOL 3350 17 G PO PACK
17.0000 g | PACK | Freq: Every day | ORAL | Status: DC
  Administered 2015-04-28: 17 g via ORAL
  Administered 2015-04-29: 4 g via ORAL
  Administered 2015-04-30: 17 g via ORAL
  Filled 2015-04-27 (×5): qty 1

## 2015-04-27 MED ORDER — VENLAFAXINE HCL ER 75 MG PO CP24
75.0000 mg | ORAL_CAPSULE | Freq: Every day | ORAL | Status: DC
  Administered 2015-04-27 – 2015-05-04 (×8): 75 mg via ORAL
  Filled 2015-04-27 (×8): qty 1

## 2015-04-27 MED ORDER — SODIUM CHLORIDE 0.9 % IV SOLN
INTRAVENOUS | Status: AC
  Administered 2015-04-27: 03:00:00 via INTRAVENOUS

## 2015-04-27 NOTE — Progress Notes (Signed)
Initial Nutrition Assessment  DOCUMENTATION CODES:   Obesity unspecified  INTERVENTION:   -Ensure Enlive po BID, each supplement provides 350 kcal and 20 grams of protein -Boost Breeze po daily, each supplement provides 250 kcal and 9 grams of protein  NUTRITION DIAGNOSIS:   Inadequate oral intake related to poor appetite as evidenced by per patient/family report, meal completion < 50%.  GOAL:   Patient will meet greater than or equal to 90% of their needs  MONITOR:   PO intake, Supplement acceptance, Labs, Weight trends, Skin, I & O's  REASON FOR ASSESSMENT:   Malnutrition Screening Tool    ASSESSMENT:   James [name] is a 79 y.o. male with a past medical history significant for metastatic bladder cancer enrolled since July in Phase 1 trial at [facility] of FGFR inhibitor, ASCVD s/p PCI to LAD and RCA in 2014, OSA on CPAP who presents with fever and fatigue.  Pt admitted with sepsis on unknown source.   Spoke with pt, daughter, and son at bedside. All confirm a general decline in health since January. Pt estimates he has lost approximately 35# in the past 9 months, due to poor oral intake, as a result from chemotherapy side effects (dry mouth and mucositis). This statement is confirmed by wt hx, which reveals a 33# (12.9%) wt loss over the past 8-9 months.   Children report that intake has progressively declined over the past several months. Pt complains that it is difficulty to chew and swallow foods due to mouth pain. He consumed 100% of his yogurt, 50% of his coffee, 1/2 of his bread, and a few bites of oatmeal this AM. Pt reports that he has been recommended he drink Ensure supplements, due does not like the flavor. Reviewed supplement options and discussed importance of nutritional adequacy to promote healing. Pt amenable to try Ensure supplement and Boost Breeze supplement; pt would like to alternate supplements for variety and improved acceptance. Provided sample of Boost Breeze  and Ensure to pt. Plan discussed with RN.  Nutrition-focused physical exam deferred due to pain and pt with layers of thick clothing on.   Labs reviewed: Na: 128 (on IV supplementation).  Diet Order:  Diet regular Room service appropriate?: Yes; Fluid consistency:: Thin  Skin:  Reviewed, no issues  Last BM:  04/27/15  Height:   Ht Readings from Last 1 Encounters:  04/26/15 5\' 8"  (1.727 m)    Weight:   Wt Readings from Last 1 Encounters:  04/26/15 222 lb (100.699 kg)    Ideal Body Weight:  70 kg  BMI:  Body mass index is 33.76 kg/(m^2).  Estimated Nutritional Needs:   Kcal:  2100-2300  Protein:  105-120 grams  Fluid:  >2.1 L  EDUCATION NEEDS:   Education needs addressed  Jenifer A. Jimmye Norman, RD, LDN, CDE Pager: [phone] After hours Pager: [phone]

## 2015-04-27 NOTE — Evaluation (Signed)
Physical Therapy Evaluation Patient Details Name: James [name] MRN: [id number] DOB: February 27, 1936 Today's Date: 04/27/2015   History of Present Illness  Patient is a 79 y/o male with hx of CAD s/p stenting, A-flutter s/p ablation, metastatic bladder carcinoma enrolled since July in Phase 1 trial at [facility] of FGFR inhibitor, OSA on Cpap, dyslipidemia admitted for fever and fatigue.  Clinical Impression  Patient presents with pain, generalized weakness, fatigue and deconditioning due to hx of metastatic bladder ca impacting safe mobility. Increased time to perform all mobility. Pt lethargic at times falling asleep mid sentence. Tolerated ambulation with Min A for balance/safety. Encouraged daily ambulation with tech. Will follow acutely to maximize independence and mobility prior to return home.    Follow Up Recommendations Home health PT;Supervision for mobility/OOB    Equipment Recommendations  None recommended by PT    Recommendations for Other Services OT consult     Precautions / Restrictions Precautions Precautions: Fall Restrictions Weight Bearing Restrictions: No      Mobility  Bed Mobility Overal bed mobility: Needs Assistance Bed Mobility: Supine to Sit;Sit to Supine     Supine to sit: Supervision;HOB elevated Sit to supine: Supervision;HOB elevated   General bed mobility comments: Use of rails for support.   Transfers Overall transfer level: Needs assistance Equipment used: None Transfers: Sit to/from Stand Sit to Stand: Min guard         General transfer comment: Min guard for safety. Has to stand for a few mins due to hx of postural hypotension. Holding onto IV pole for support.   Ambulation/Gait Ambulation/Gait assistance: Min assist Ambulation Distance (Feet): 150 Feet Assistive device:  (IV pole) Gait Pattern/deviations: Step-through pattern;Decreased stride length;Trunk flexed;Drifts right/left   Gait velocity interpretation: <1.8 ft/sec, indicative of  risk for recurrent falls General Gait Details: Slow, mildly unsteady gait with multiple standing rest breaks due to fatigue. Holding onto IV pole for support and rail/wall with other UE.   Stairs            Wheelchair Mobility    Modified Rankin (Stroke Patients Only)       Balance Overall balance assessment: Needs assistance Sitting-balance support: Feet supported;No upper extremity supported Sitting balance-Leahy Scale: Fair     Standing balance support: During functional activity Standing balance-Leahy Scale: Poor Standing balance comment: Relient on UE support for balance/safety.                              Pertinent Vitals/Pain Pain Assessment: Faces Faces Pain Scale: Hurts little more Pain Location: flanks, right hip Pain Descriptors / Indicators: Sore Pain Intervention(s): Monitored during session;Repositioned;Premedicated before session    Home Living Family/patient expects to be discharged to:: Private residence Living Arrangements: Spouse/significant other Available Help at Discharge: Family;Available 24 hours/day Type of Home: House Home Access: Stairs to enter   [employer] of Steps: 3   Home Equipment: Cane - single point;Walker - 4 wheels;Tub bench;Bedside commode      Prior Function Level of Independence: Independent with assistive device(s)         Comments: Pt reports using SPC and furniture walking within home. Does not like RW.      Hand Dominance        Extremity/Trunk Assessment   Upper Extremity Assessment: Defer to OT evaluation           Lower Extremity Assessment: Generalized weakness         Communication  Communication: No difficulties  Cognition Arousal/Alertness: Lethargic Behavior During Therapy: WFL for tasks assessed/performed Overall Cognitive Status: Within Functional Limits for tasks assessed                      General Comments General comments (skin integrity,  edema, etc.): Family present in room during PT evaluation.    Exercises        Assessment/Plan    PT Assessment Patient needs continued PT services  PT Diagnosis Difficulty walking;Generalized weakness;Acute pain   PT Problem List Decreased strength;Pain;Decreased balance;Decreased mobility;Decreased activity tolerance  PT Treatment Interventions Balance training;Gait training;Functional mobility training;Therapeutic activities;Therapeutic exercise;Patient/family education;Stair training   PT Goals (Current goals can be found in the Care Plan section) Acute Rehab PT Goals Patient Stated Goal: to feel better PT Goal Formulation: With patient Time For Goal Achievement: 05/11/15 Potential to Achieve Goals: Fair    Frequency Min 4X/week   Barriers to discharge        Co-evaluation               End of Session Equipment Utilized During Treatment: Gait belt Activity Tolerance: Patient limited by fatigue;Patient limited by lethargy Patient left: in bed;with call bell/phone within reach;with family/visitor present Nurse Communication: Mobility status;Other (comment) (Encouraged tech to ambulate with pt.)         Time: 1683-7290 PT Time Calculation (min) (ACUTE ONLY): 33 min   Charges:   PT Evaluation $Initial PT Evaluation Tier I: 1 Procedure PT Treatments $Gait Training: 8-22 mins   PT G Codes:        Shauna A Hartshorne 04/27/2015, 11:42 AM  Wray Kearns, PT, DPT [phone]

## 2015-04-27 NOTE — Progress Notes (Signed)
Paged attending regarding pt's arrival to unit from ED.  Pt alert and oriented.  VSS. Pt oriented to room and unit.  Will carry out MD orders and continue to monitor pt closely. James [name]

## 2015-04-27 NOTE — Progress Notes (Addendum)
James [name] ZSW:[id number] DOB: January 09, 1936 DOA: 04/26/2015 PCP: Dione Plover, MD  Brief narrative: 79 y/o ? Metastatic bladder cancer diagnosed 1996 with superficial bladder cancer -Became metastatic 06/2013 See progress note 12/28/14  followed at [facility]-recently seen by his oncologist Dr. Siri Cole (on phase I study F GFR inhibitor for restaging as well as clinical trial medications) -He has been recommended hospice in the past if not wanting any specific therapy CAD status post stent 08/04/13 LAD and proximal RCA A flutter status post ablation 2012-not apparently on any anticoagulation currently OSA on CPAP Hyperlipidemia  Admitted 9/16 with temperature 101.6, tachycardia tachypnea and a 2 day history of feeling this way He enrolled at [facility] in phase I trial July 2016  Workup revealed no pneumonia and only nasal congestion    Past medical history-As per Problem list Chart reviewed as below- nad  Consultants:  None yet  Procedures:  None  Antibiotics:  Vancomycin, Zosyn 9/16   Subjective     Objective     Objective: Filed Vitals:   04/26/15 2043 04/26/15 2252 04/27/15 0243 04/27/15 0507  BP: 142/70 116/68 120/60 123/63  Pulse: 98 98 91 73  Temp: 99.3 F (37.4 C) 101.6 F (38.7 C) 99.1 F (37.3 C) 98 F (36.7 C)  TempSrc: Oral Oral Oral Oral  Resp: 22 18 20 20   Height: 5\' 8"  (1.727 m)     Weight: 100.699 kg (222 lb)     SpO2: 100% 98% 96% 100%   No intake or output data in the 24 hours ending 04/27/15 1023  Exam:  General: EOMI NCAT Cardiovascular: S1-S2 no murmur rub or gallop Respiratory: Clinically clear no added sound Abdomen: Soft nontender nondistended Skin no lower extremity edema although slight warmth Neuro intact  Data Reviewed: Basic Metabolic Panel:  Recent Labs Lab 04/26/15 2134 04/27/15 0605  NA 131* 128*  K 4.2 4.0  CL 99* 96*  CO2 24 23  GLUCOSE 117* 122*  BUN 15 16  CREATININE 1.16 1.19    CALCIUM 9.0 8.3*   Liver Function Tests:  Recent Labs Lab 04/26/15 2134  AST 24  ALT 13*  ALKPHOS 96  BILITOT 0.6  PROT 7.7  ALBUMIN 3.4*   No results for input(s): LIPASE, AMYLASE in the last 168 hours. No results for input(s): AMMONIA in the last 168 hours. CBC:  Recent Labs Lab 04/26/15 2134 04/27/15 0605  WBC 7.2 6.1  NEUTROABS 5.6  --   HGB 10.3* 9.4*  HCT 32.6* 29.2*  MCV 87.6 86.6  PLT 238 200   Cardiac Enzymes: No results for input(s): CKTOTAL, CKMB, CKMBINDEX, TROPONINI in the last 168 hours. BNP: Invalid input(s): POCBNP CBG: No results for input(s): GLUCAP in the last 168 hours.  Recent Results (from the past 240 hour(s))  Culture, blood (routine x 2)     Status: None (Preliminary result)   Collection Time: 04/26/15  9:27 PM  Result Value Ref Range Status   Specimen Description BLOOD LEFT ARM  Final   Special Requests BOTTLES DRAWN AEROBIC AND ANAEROBIC 10CC  Final   Culture PENDING  Incomplete   Report Status PENDING  Incomplete     Studies:              All Imaging reviewed and is as per above notation   Scheduled Meds: . sodium chloride   Intravenous STAT  . acidophilus  1 capsule Oral Daily  . carvedilol  6.25 mg Oral BID WC  . enoxaparin (LOVENOX)  injection  40 mg Subcutaneous Daily  . morphine  15 mg Oral BID  . piperacillin-tazobactam (ZOSYN)  IV  3.375 g Intravenous Q8H  . polyethylene glycol  17 g Oral Daily  . sertraline  150 mg Oral Daily  . vancomycin  750 mg Intravenous Q12H  . venlafaxine XR  75 mg Oral Q breakfast   Continuous Infusions:    Assessment/Plan:  Agree with plan of care as per Dr. Loleta Books No specific source of weight cultures and urine culture Apparently had cellulitis in lower extremities treated about 10 days ago with ten-day course of antibiotics Had nausea vomiting on 9/12 and has had diarrhea although specifics of predicted MiraLAX DDX? Viral illness We will continue empiric antibiotics although we can  probably narrow them down in the next 24-48 hours if no source is found on cultures We will continue IV fluids--he has a sodium 128 that is likely secondary to T toast potomania and he will need some solute We will recheck labs again in the morning I've increased his pain management to every 4 when necessary at his request Updated family at the bedside   Verneita Griffes, MD  [facility] Pager [phone] 04/27/2015, 10:23 AM    LOS: 0 days

## 2015-04-27 NOTE — H&P (Signed)
History and Physical  James [name]:[id number] DOB: 04/17/1936 DOA: 04/26/2015  Referring physician: Hanley Seamen, MD PCP: Dione Plover, MD   Chief Complaint: "I was so tired today I couldn't even stay up to talk to my son who is visiting from out of town."  HPI: [name] is a 79 y.o. male with a past medical history significant for metastatic bladder cancer enrolled since July in Phase 1 trial at [facility] of FGFR inhibitor, ASCVD s/p PCI to LAD and RCA in 2014, OSA on CPAP who presents with fever and fatigue.  The patient exhausted conventional treatment options for his metastatic bladder cancer earlier this year.  He has enrolled in several phase 1 trials at [facility] since then, initially noted progression of his metastases until July of this year when he entered a FGFR inhibitor trial at [facility] and has stalled the cancer.   He is currently in the off week of a three week cycle as of yesterday.  Two nights ago he had a CT abdomen with oral contrast, and yesterday had frequent formed stools, then subsequently was very tired, and then started to feel feverish and so presented to the ER.  In the ED, the patient was febrile to 101.27F, tachycardic, tachypneic.  A CXR was unremarkable, blood and urine cultures were collected, and he was started on empiric broad spectrum antibiotics for presumed sepsis.   Review of Systems:  Patient seen 0145 on 04/27/2015. Pt complains of fatigue, fever. Pt denies any cough, sputum.  Denies pain at port. Denies dysuria, rashes, boils.  Denies neck stiffness, confusion.  Otherwise, twelve systems were reviewed and were negative except as noted above in the history of present illness.  Past Medical History  Diagnosis Date  . Atrial flutter 05/2009    STATUS POST CARDIOVERSION;  s/p RFCA in 2012 (Dr. Caryl Comes)  . Depression   . History of immunotherapy 2014    "had many times before and it worked; had a severe reaction 01-24-2013; they coded me, I had sepsis;  almost died" (08-17-13)  . OSA on CPAP   . Pneumonia   . CAD (coronary artery disease)     a. NSTEMI 07/2013:  LHC (08/03/13): mLAD 95% assoc w/ thrombus, dLAD 50%, pRCA 80%, mRCA 50%, normal LVF. => PCI:  Rebel (4 x 12 mm) BMS to the mLAD and Rebel (4 x 15 mm) BMS to the pRCA.  . Osteoarthritis   . Dyslipidemia     Lipitor started at time of NSTEMI 07/2013  . Bladder cancer     'started chemo 07/2013 " (August 17, 2013)   Past Surgical History  Procedure Laterality Date  . Carpal tunnel release Bilateral ~ 2012  . Atrial flutter ablation  2012  . Cardioversion  05/2009  . Transurethral resection of bladder tumor      "several" (08/17/13)  . Cystoscopy  4-12/2012    "biopsies; removal of tissue" (Aug 17, 2013)  . Renal mass excision  03/2013    "I've had 2 of these; percutaneous" (08-17-13)  . Lymph node biopsy  06/2013    "in the area of the kidney" (08-17-2013)  . Tonsillectomy and adenoidectomy  1940's  . Left heart catheterization with coronary angiogram N/A 08/03/2013    Procedure: LEFT HEART CATHETERIZATION WITH CORONARY ANGIOGRAM;  Surgeon: Ramond Dial, MD;  Location: [facility] CATH LAB;  Service: Cardiovascular;  Laterality: N/A;  . Percutaneous coronary stent intervention (pci-s)  08/03/2013    Procedure: PERCUTANEOUS CORONARY STENT INTERVENTION (PCI-S);  Surgeon:  Ramond Dial, MD;  Location: [facility] CATH LAB;  Service: Cardiovascular;;   Social History:  reports that he has quit smoking. His smoking use included Cigarettes. He has a 22.5 pack-year smoking history. He has never used smokeless tobacco. He reports that he does not drink alcohol or use illicit drugs. Patient lives with wife.  He is a former smoker.  He is a former Optician, dispensing at [employer].  He currently has difficulty ambulating because of weakness/fatigue.  Allergies  Allergen Reactions  . Benadryl [Diphenhydramine Hcl] Other (See Comments)    Pt becomes very disoriented, extremely tired, memory loss  . Morphine  Nausea Only    OK if given slowly.  . Other Other (See Comments)    Patient states when he takes flomax and protonix together makes him break out in a rash  . Adhesive [Tape] Itching and Rash    Blisters, tears skin  . Bupropion Hives and Rash  . Flomax [Tamsulosin Hcl] Rash    When taken with protonix only patient can take this alone  . Pantoprazole Sodium Rash    When taken with flomax only patient can take this alone  . Tamsulosin Rash    When taken with protonix only patient can take this alone    Family History  Problem Relation Age of Onset  . Heart failure Father   . Stroke Father   . Kidney failure Mother   . Coronary artery disease      Prior to Admission medications   Medication Sig Start Date End Date Taking? Authorizing Provider  acetaminophen (TYLENOL) 500 MG tablet Take 500 mg by mouth every 6 (six) hours as needed for fever.   Yes Historical Provider, MD  antiseptic oral rinse (BIOTENE) LIQD 15 mLs by Mouth Rinse route as needed for dry mouth.   Yes Historical Provider, MD  carvedilol (COREG) 6.25 MG tablet TAKE 1 TABLET (6.25 MG TOTAL) BY MOUTH 2 (TWO) TIMES DAILY. 10/16/14  Yes Thayer Headings, MD  Investigational - Study Medication Take 13.5 mg by mouth See admin instructions. Additional Study Details: [place] [id number] Delfin Edis INCB [phone]) -eIRB # [phone] chemo tablet  14 days on, 7 days off. Take with water on an empty stomach.   Yes Historical Provider, MD  lidocaine-prilocaine (EMLA) cream Apply topically as needed for port   Yes Historical Provider, MD  loperamide (IMODIUM A-D) 2 MG tablet Take 2 mg by mouth 3 (three) times daily as needed for diarrhea or loose stools.   Yes Historical Provider, MD  magic mouthwash w/lidocaine SOLN Take 5 mLs by mouth 3 (three) times daily as needed for mouth pain. NO BENADRYL   Yes Historical Provider, MD  morphine (MS CONTIN) 15 MG 12 hr tablet Take 1 tablet (15 mg total) by mouth every 12 (twelve) hours. 10/20/14  Yes Simbiso  Ranga, MD  MULTIPLE VITAMIN PO Take by mouth daily.   Yes Historical Provider, MD  NITROSTAT 0.4 MG SL tablet PLACE 1 TABLET UNDER THE TONGUE EVERY 5 MINUTES AS NEEDED FOR CHEST PAIN 04/05/15  Yes Thayer Headings, MD  Omega-3 Fatty Acids (OMEGA 3 PO) Take 1 capsule by mouth 2 (two) times daily. [phone] mg   Yes Historical Provider, MD  oxyCODONE (OXY IR/ROXICODONE) 5 MG immediate release tablet Take 5 mg by mouth 2 (two) times daily as needed for breakthrough pain.   Yes Historical Provider, MD  polyethylene glycol (MIRALAX / GLYCOLAX) packet Take 17 g by mouth daily.   Yes  Historical Provider, MD  Probiotic Product (PROBIOTIC & ACIDOPHILUS EX ST PO) Take by mouth daily.   Yes Historical Provider, MD  prochlorperazine (COMPAZINE) 10 MG tablet Take 10 mg by mouth every 6 (six) hours as needed for nausea or vomiting.   Yes Historical Provider, MD  sertraline (ZOLOFT) 100 MG tablet Take 150 mg by mouth daily.    Yes Historical Provider, MD  sevelamer carbonate (RENVELA) 800 MG tablet Take 800 mg by mouth See admin instructions. Three times daily with meals, only on days with Study Drug   Yes Historical Provider, MD  venlafaxine XR (EFFEXOR-XR) 75 MG 24 hr capsule Take 75 mg by mouth daily with breakfast.   Yes Historical Provider, MD    Physical Exam: BP 120/60 mmHg  Pulse 91  Temp(Src) 99.1 F (37.3 C) (Oral)  Resp 20  Ht 5\' 8"  (1.727 m)  Wt 100.699 kg (222 lb)  BMI 33.76 kg/m2  SpO2 96% General: Elderly white male, flushed, lying flat in bed. Tired appearing.  In no acute distress.  Responds appropriately to questions.   HEENT: Corneas clear, conjunctivae and sclerae normal without injection or icterus, lids and lashes normal.  PERRL and EOMI.  Nose with crusted brown discharge at edge of nares.  The nasal mucosa is dry, the septum has some visible vessels that appear to be the only possible source of bleeding, there are no nasal masses and no purulence.  There is no sinus tenderness over  maxilla, frontal sinuses.  OP tacky, exudates, cobblestoning, or ulcers.  No airway deformities.  Neck supple.  No cervical or axillary lymphadenopathy. Cardiac: Tachycardic, regular, nl S1-S2, no murmurs, rubs, gallops.  Capillary refill is less than 2 seconds.  There is lower extremity edema bilaterally, without pitting.  There is mild scrotal edema.  No JVD. Respiratory: Normal respiratory rate and rhythm.  CTAB without rales or wheezes apreciated. Abdomen: BS present.  Abdomen soft.  No TTP or rebound all quadrants.  No masses or organomegaly.  No ascites, distension. Skin: Flushed, warm.  Dry.  No suspicious rashes or abscess appreciated on face, neck, back, chest, abdomen, axillae, legs, or feet.  The port site is clean and dry and not inflamed. Extremities: No deformities/injuries.  5/5 grip strength and upper extremity flexion/extension, symmetrically.   Neuro: Sensorium intact.  Cranial nerves 3-12 intact.  Speech is fluent.  Naming is grossly intact, and the patient's recall, recent and remote, as well as general fund of knowledge seem within normal limits.  Muscle tone diminshed globally.  Psych: Appropriate affect.  Normal rate and rhythm of speech.  Thought content appropriate, and thought process linear.  No aural or visual hallucinations or delusions. Attention and concentration are normal.           Labs on Admission:  Basic Metabolic Panel:  Recent Labs Lab 04/26/15 2134  NA 131*  K 4.2  CL 99*  CO2 24  GLUCOSE 117*  BUN 15  CREATININE 1.16  CALCIUM 9.0   Liver Function Tests:  Recent Labs Lab 04/26/15 2134  AST 24  ALT 13*  ALKPHOS 96  BILITOT 0.6  PROT 7.7  ALBUMIN 3.4*   CBC:  Recent Labs Lab 04/26/15 2134  WBC 7.2  NEUTROABS 5.6  HGB 10.3*  HCT 32.6*  MCV 87.6  PLT 238    Radiological Exams on Admission: Dg Chest 2 View Personally reviewed. 04/26/2015    Small bilateral effusions without substantial parenchymal  opacity.     Assessment/Plan Present on  Admission:  . Atrial flutter . Bladder cancer metastasized to liver . Sleep apnea . Fever . Cutaneous lupus erythematosus . Neoplastic malignant related fatigue . Sepsis . Hyponatremia . Normocytic anemia    1. Presumed sepsis, unknown source: Patient on chronic low-dose prednisone, currently in Phase 1 trial of FGF receptor therapy, now with fever, tachycardia, tachypnea, increased weakness without evidence of hypoperfusion or end-organ damage.  Has indwelling line and most recent hospitalization was in March.  Blood and urine cultures pending.  Chest x-ray and history not convincing for pneumonia.  Abdomen benign.  The patient reports recent nasal discharge, but the sinuses are not tender and there is no mass or purulence by otoscopic evaluation at the bedside.  - Vancomycin 750 mg q12 - piperacillin-tazobactam 3.375 g q8hrs - NS at 75 cc/hr - Follow blood culture - Follow urine culture - Hold stress dose steroids, given adequate BP  2. Atrial flutter: - Continue home carvedilol - ECG in morning  3. Cutaneous lupus: - Continue home prednisone 5 mg  4. Hyponatremia: Chronic. Stable - Fluid resuscitation - Trend BMP  5. Chronic normocytic anemia: Stable  6. Chronic medical conditions: Mood: Continue home venlafaxine and sertraline Bowels: Continue home Miralax daily, Compazine for nausea PRN, bisacodyl PRN, acidophilus and Biotene rinse. Pain: Continue morphine SR BID and oxycodone PRN OSA: Continue CPAP  DVT PPx: Lovenox  Diet: Regular    Code Status: DNR  Family Communication: Wife, son and daughter present at the bedside and in agreement with plan of care outlined above.   Disposition Plan:  The appropriate admission status for this patient is INPATIENT. Inpatient status is judged to be reasonable and necessary in order to provide the required intensity of service to ensure the patient's safety. The patient's  presenting symptoms, physical exam findings, and initial radiographic and laboratory data in the context of their chronic comorbidities is felt to place them at high risk for further clinical deterioration. Furthermore, it is not anticipated that the patient will be medically stable for discharge from the hospital within 2 midnights of admission. The following factors support the admission status of inpatient.   A. The patient's presenting symptoms include fatigue, fever. B. The worrisome physical exam findings include fever, tachycardia, tachypnea. C. The initial radiographic and laboratory data are reassuring. D. The chronic co-morbidities include metastatic bladder cancer, atrial flutter, cutaneous LE on prednisone, and coronary disease. E. Patient requires inpatient status due to high intensity of service, high risk for further deterioration and high frequency of surveillance required. F. I certify that at the point of admission it is my clinical judgment that the patient will require inpatient hospital care spanning beyond 2 midnights from the point of admission.     Edwin Dada [facility] Pager [phone]

## 2015-04-27 NOTE — Care Management Note (Signed)
Case Management Note  Patient Details  Name: James [name] MRN: [id number] Date of Birth: [birth year]  Subjective/Objective:                    Action/Plan:  UR completed  Expected Discharge Date:                  Expected Discharge Plan:  Home/Self Care  In-House Referral:     Discharge planning Services     Post Acute Care Choice:    Choice offered to:     DME Arranged:    DME Agency:     HH Arranged:    HH Agency:     Status of Service:  In process, will continue to follow  Medicare Important Message Given:    Date Medicare IM Given:    Medicare IM give by:    Date Additional Medicare IM Given:    Additional Medicare Important Message give by:     If discussed at [place] of Stay Meetings, dates discussed:    Additional Comments:  Marilu Favre, RN 04/27/2015, 8:02 AM

## 2015-04-27 NOTE — Progress Notes (Signed)
ANTIBIOTIC CONSULT NOTE - INITIAL  Pharmacy Consult for Vancomycin and Zosyn Indication: rule out sepsis  Allergies  Allergen Reactions  . Benadryl [Diphenhydramine Hcl] Other (See Comments)    Pt becomes very disoriented, extremely tired, memory loss  . Morphine Nausea Only    OK if given slowly.  . Other Other (See Comments)    Patient states when he takes flomax and protonix together makes him break out in a rash  . Adhesive [Tape] Itching and Rash    Blisters, tears skin  . Bupropion Hives and Rash  . Flomax [Tamsulosin Hcl] Rash    When taken with protonix only patient can take this alone  . Pantoprazole Sodium Rash    When taken with flomax only patient can take this alone  . Tamsulosin Rash    When taken with protonix only patient can take this alone    Patient Measurements: Height: 5\' 8"  (172.7 cm) Weight: 222 lb (100.699 kg) IBW/kg (Calculated) : 68.4 Adjusted Body Weight:   Vital Signs: Temp: 99.1 F (37.3 C) (09/16 0243) Temp Source: Oral (09/16 0243) BP: 120/60 mmHg (09/16 0243) Pulse Rate: 91 (09/16 0243) Intake/Output from previous day:   Intake/Output from this shift:    Labs:  Recent Labs  04/26/15 2134  WBC 7.2  HGB 10.3*  PLT 238  CREATININE 1.16   Estimated Creatinine Clearance: 61.3 mL/min (by C-G formula based on Cr of 1.16). No results for input(s): VANCOTROUGH, VANCOPEAK, VANCORANDOM, GENTTROUGH, GENTPEAK, GENTRANDOM, TOBRATROUGH, TOBRAPEAK, TOBRARND, AMIKACINPEAK, AMIKACINTROU, AMIKACIN in the last 72 hours.   Microbiology: Recent Results (from the past 720 hour(s))  Culture, blood (routine x 2)     Status: None (Preliminary result)   Collection Time: 04/26/15  9:27 PM  Result Value Ref Range Status   Specimen Description BLOOD LEFT ARM  Final   Special Requests BOTTLES DRAWN AEROBIC AND ANAEROBIC 10CC  Final   Culture PENDING  Incomplete   Report Status PENDING  Incomplete    Medical History: Past Medical History  Diagnosis  Date  . Atrial flutter 05/2009    STATUS POST CARDIOVERSION;  s/p RFCA in 2012 (Dr. Caryl Comes)  . Depression   . History of immunotherapy 2014    "had many times before and it worked; had a severe reaction 01/31/2013; they coded me, I had sepsis; almost died" (Aug 24, 2013)  . OSA on CPAP   . Pneumonia   . CAD (coronary artery disease)     a. NSTEMI 07/2013:  LHC (08/03/13): mLAD 95% assoc w/ thrombus, dLAD 50%, pRCA 80%, mRCA 50%, normal LVF. => PCI:  Rebel (4 x 12 mm) BMS to the mLAD and Rebel (4 x 15 mm) BMS to the pRCA.  . Osteoarthritis   . Dyslipidemia     Lipitor started at time of NSTEMI 07/2013  . Bladder cancer     'started chemo 07/2013 " (24-Aug-2013)    Medications:  Prescriptions prior to admission  Medication Sig Dispense Refill Last Dose  . acetaminophen (TYLENOL) 500 MG tablet Take 500 mg by mouth every 6 (six) hours as needed for fever.   Past Month at Unknown time  . antiseptic oral rinse (BIOTENE) LIQD 15 mLs by Mouth Rinse route as needed for dry mouth.   04/27/2015 at Unknown time  . carvedilol (COREG) 6.25 MG tablet TAKE 1 TABLET (6.25 MG TOTAL) BY MOUTH 2 (TWO) TIMES DAILY. 180 tablet 3 04/25/2015 at 2000  . Investigational - Study Medication Take 13.5 mg by mouth See admin instructions.  Additional Study Details: [place] [id number] Delfin Edis INCB [phone]) -eIRB # [phone] chemo tablet  14 days on, 7 days off. Take with water on an empty stomach.   04/25/2015  . lidocaine-prilocaine (EMLA) cream Apply topically as needed for port   Past Week at Unknown time  . loperamide (IMODIUM A-D) 2 MG tablet Take 2 mg by mouth 3 (three) times daily as needed for diarrhea or loose stools.   Past Month at Unknown time  . magic mouthwash w/lidocaine SOLN Take 5 mLs by mouth 3 (three) times daily as needed for mouth pain. NO BENADRYL   Past Week at Unknown time  . morphine (MS CONTIN) 15 MG 12 hr tablet Take 1 tablet (15 mg total) by mouth every 12 (twelve) hours. 60 tablet 0 04/26/2015 at Unknown  time  . MULTIPLE VITAMIN PO Take by mouth daily.   04/25/2015  . NITROSTAT 0.4 MG SL tablet PLACE 1 TABLET UNDER THE TONGUE EVERY 5 MINUTES AS NEEDED FOR CHEST PAIN 25 tablet 0 unknown  . Omega-3 Fatty Acids (OMEGA 3 PO) Take 1 capsule by mouth 2 (two) times daily. [phone] mg   Past Month at Unknown time  . oxyCODONE (OXY IR/ROXICODONE) 5 MG immediate release tablet Take 5 mg by mouth 2 (two) times daily as needed for breakthrough pain.   04/25/2015  . polyethylene glycol (MIRALAX / GLYCOLAX) packet Take 17 g by mouth daily.   04/25/2015  . Probiotic Product (PROBIOTIC & ACIDOPHILUS EX ST PO) Take by mouth daily.   04/25/2015  . prochlorperazine (COMPAZINE) 10 MG tablet Take 10 mg by mouth every 6 (six) hours as needed for nausea or vomiting.   04/26/2015 at Unknown time  . sertraline (ZOLOFT) 100 MG tablet Take 150 mg by mouth daily.    04/25/2015  . sevelamer carbonate (RENVELA) 800 MG tablet Take 800 mg by mouth See admin instructions. Three times daily with meals, only on days with Study Drug   04/25/2015  . venlafaxine XR (EFFEXOR-XR) 75 MG 24 hr capsule Take 75 mg by mouth daily with breakfast.   04/25/2015  . [DISCONTINUED] atorvastatin (LIPITOR) 40 MG tablet TAKE 1 TABLET (40 MG TOTAL) BY MOUTH DAILY. (Patient not taking: Reported on 04/27/2015) 90 tablet 3 Not Taking at Unknown time  . [DISCONTINUED] calcium carbonate (OS-CAL - DOSED IN MG OF ELEMENTAL CALCIUM) 1250 (500 CA) MG tablet Take 1 tablet (500 mg of elemental calcium total) by mouth 2 (two) times daily with a meal. (Patient not taking: Reported on 04/27/2015) 14 tablet 0 Not Taking at Unknown time  . [DISCONTINUED] erythromycin [facility]) ophthalmic ointment Place 1 application into the left eye 4 (four) times daily. (Patient not taking: Reported on 04/27/2015) 3.5 g 0 Not Taking at Unknown time  . [DISCONTINUED] LORazepam (ATIVAN) 1 MG tablet Take 1 tablet (1 mg total) by mouth every 12 (twelve) hours as needed for anxiety. (Patient not  taking: Reported on 04/27/2015) 30 tablet 0 Not Taking at Unknown time   Scheduled:  . sodium chloride   Intravenous STAT  . carvedilol  6.25 mg Oral BID WC  . enoxaparin (LOVENOX) injection  40 mg Subcutaneous Q24H  . morphine  15 mg Oral Q12H  . polyethylene glycol  17 g Oral Daily  . PROBIOTIC & ACIDOPHILUS EX ST   Oral Daily  . sertraline  150 mg Oral Daily  . venlafaxine XR  75 mg Oral Q breakfast   Infusions:   Assessment: 79yo male with history of Aflutter, CAD,  HLD and bladder CA presents with fever. Pharmacy is consulted to dose vancomycin and zosyn for suspected sepsis. Tmax is 99.1, WBC 7.2, sCr 1.16.  Pt received zosyn 3.375g and vancomycin 1g in the ED. Will add vancomycin 750mg  to fully load ~ 25 mg/kg.  Goal of Therapy:  Vancomycin trough level 15-20 mcg/ml  Plan:  Vancomycin 1750mg  IV load followed by 750mg  q12h Zosyn 3.375g IV q8h Measure antibiotic drug levels at steady state Follow up culture results, renal function and clinical course  Andrey Cota. Diona Foley, PharmD Clinical Pharmacist Pager [phone] 04/27/2015,3:01 AM

## 2015-04-28 DIAGNOSIS — C7800 Secondary malignant neoplasm of unspecified lung: Secondary | ICD-10-CM

## 2015-04-28 DIAGNOSIS — C787 Secondary malignant neoplasm of liver and intrahepatic bile duct: Secondary | ICD-10-CM

## 2015-04-28 DIAGNOSIS — D649 Anemia, unspecified: Secondary | ICD-10-CM

## 2015-04-28 DIAGNOSIS — K1231 Oral mucositis (ulcerative) due to antineoplastic therapy: Secondary | ICD-10-CM

## 2015-04-28 DIAGNOSIS — J90 Pleural effusion, not elsewhere classified: Secondary | ICD-10-CM

## 2015-04-28 DIAGNOSIS — Z960 Presence of urogenital implants: Secondary | ICD-10-CM

## 2015-04-28 DIAGNOSIS — C679 Malignant neoplasm of bladder, unspecified: Secondary | ICD-10-CM

## 2015-04-28 DIAGNOSIS — C7951 Secondary malignant neoplasm of bone: Secondary | ICD-10-CM

## 2015-04-28 DIAGNOSIS — E43 Unspecified severe protein-calorie malnutrition: Secondary | ICD-10-CM

## 2015-04-28 DIAGNOSIS — I484 Atypical atrial flutter: Secondary | ICD-10-CM

## 2015-04-28 DIAGNOSIS — R509 Fever, unspecified: Secondary | ICD-10-CM

## 2015-04-28 LAB — BASIC METABOLIC PANEL
ANION GAP: 7 (ref 5–15)
BUN: 18 mg/dL (ref 6–20)
CHLORIDE: 98 mmol/L — AB (ref 101–111)
CO2: 23 mmol/L (ref 22–32)
Calcium: 8.2 mg/dL — ABNORMAL LOW (ref 8.9–10.3)
Creatinine, Ser: 1.44 mg/dL — ABNORMAL HIGH (ref 0.61–1.24)
GFR, EST AFRICAN AMERICAN: 53 mL/min — AB (ref >60)
GFR, EST NON AFRICAN AMERICAN: 45 mL/min — AB (ref >60)
Glucose, Bld: 102 mg/dL — ABNORMAL HIGH (ref 65–99)
POTASSIUM: 3.9 mmol/L (ref 3.5–5.1)
SODIUM: 128 mmol/L — AB (ref 135–145)

## 2015-04-28 LAB — CBC
HCT: 26.9 % — ABNORMAL LOW (ref 39.0–52.0)
HEMOGLOBIN: 8.5 g/dL — AB (ref 13.0–17.0)
MCH: 27.5 pg (ref 26.0–34.0)
MCHC: 31.6 g/dL (ref 30.0–36.0)
MCV: 87.1 fL (ref 78.0–100.0)
PLATELETS: 195 10*3/uL (ref 150–400)
RBC: 3.09 MIL/uL — AB (ref 4.22–5.81)
RDW: 16 % — ABNORMAL HIGH (ref 11.5–15.5)
WBC: 5.1 10*3/uL (ref 4.0–10.5)

## 2015-04-28 LAB — C-REACTIVE PROTEIN: CRP: 9 mg/dL — AB (ref <1.0)

## 2015-04-28 LAB — SEDIMENTATION RATE: SED RATE: 79 mm/h — AB (ref 0–16)

## 2015-04-28 MED ORDER — MAGIC MOUTHWASH W/LIDOCAINE: 5.0000 mL | Freq: Four times a day (QID) | ORAL | Status: DC | PRN

## 2015-04-28 MED ORDER — DEXTROSE 5 % IV SOLN
2.0000 g | Freq: Two times a day (BID) | INTRAVENOUS | Status: DC
  Administered 2015-04-28 – 2015-05-03 (×10): 2 g via INTRAVENOUS
  Filled 2015-04-28 (×12): qty 2

## 2015-04-28 MED ORDER — VANCOMYCIN HCL 10 G IV SOLR
1250.0000 mg | INTRAVENOUS | Status: DC
  Administered 2015-04-28 – 2015-04-30 (×3): 1250 mg via INTRAVENOUS
  Filled 2015-04-28 (×4): qty 1250

## 2015-04-28 MED ORDER — SODIUM CHLORIDE 0.9 % IV SOLN
INTRAVENOUS | Status: DC
  Administered 2015-04-28 – 2015-05-01 (×5): via INTRAVENOUS

## 2015-04-28 NOTE — Consult Note (Addendum)
[facility]    Date of Admission:  04/26/2015           Day 3 vancomycin        Day 3 piperacillin tazobactam       Reason for Consult: Fever    Referring Physician: Dr. Verneita Griffes Primary Care Physician: Dr. Deland Pretty  Principal Problem:   Fever Active Problems:   Sepsis   Metastasis from bladder cancer   Atrial flutter   Sleep apnea   Depression   Hyponatremia   Normocytic anemia   Cutaneous lupus erythematosus   Cancer associated pain   Neoplastic malignant related fatigue   Generalized weakness   Anemia   Protein-calorie malnutrition, severe   . acidophilus  1 capsule Oral Daily  . carvedilol  6.25 mg Oral BID WC  . enoxaparin (LOVENOX) injection  40 mg Subcutaneous Daily  . feeding supplement  1 Container Oral BID BM  . feeding supplement (ENSURE ENLIVE)  237 mL Oral BID BM  . morphine  15 mg Oral BID  . piperacillin-tazobactam (ZOSYN)  IV  3.375 g Intravenous Q8H  . polyethylene glycol  17 g Oral Daily  . sertraline  150 mg Oral Daily  . vancomycin  1,250 mg Intravenous Q24H  . venlafaxine XR  75 mg Oral Q breakfast    Recommendations: 1. Continue vancomycin 2. Narrow piperacillin tazobactam to ceftazidime 3. Await results of blood cultures   Assessment: James [name] has recurrent fever in the setting of widely metastatic bladder cancer and recent phase I therapy with a fibroblast growth factor receptor inhibitor. He has mucositis related to his therapy. The source of his fever is not entirely clear, just as it was not clear on his 2 previous admissions this year. So far his blood cultures are negative and there is no other convincing evidence of Port-A-Cath infection. There was no evidence of infection seen on his recent CT scan and his chest x-ray shows only mild stable bilateral pleural effusions. His urinalysis is unremarkable. A brief search of the literature does not suggest that fever is a common adverse reaction  to FGFR inhibitors. His recent CT scan was somewhat of a mixed picture. Some areas of metastases were stable, some worse and so, a little better. I doubt that his fever is due to massive tumor lysis. He seems to be a little bit better than he was on admission. He does not need the broad anaerobic coverage afforded by piperacillin tazobactam. I will change it to ceftazidime and continue vancomycin. I will follow-up tomorrow.    HPI: James [name] is a 79 y.o. male long-standing bladder cancer metastatic to lung, liver and bone. He has a remote history of being treated with BCG bladder instillations. Last year while taking a newer, experimental chemotherapy at [facility] he developed a severe generalized rash. He stopped the chemotherapeutic agent was started on high-dose steroid-induced which were gradually tapered. He appeared to develop a super imposed impetigo and had to be treated with antibiotics last December. He was hospitalized here in January with fever and confusion. No obvious cause for the fever was found and the fever resolved over 7 days while on empiric vancomycin and cefepime. He was rehospitalized in March with fever and confusion. 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 His fevers resolved after 3 days on empiric vancomycin, piperacillin tazobactam and oseltamivir. No obvious source for the fever was found.  He was seen at the [facility] in May. Repeat CT showed further progression of his metastases and he was enrolled in outpatient hospice/palliative care. He was seen back at [facility] in early July and enrolled in a phase I study of a fibroblast growth factor receptor inhibitor. He just recently completed his third every week cycle of therapy. Restaging CT scan of his chest, abdomen  and pelvis done on 04/25/2015 showed:   "Impression: 1. Worsening osseous metastatic lesions, most notably in the right ilium and L5 vertebral body.  2. Hepatic metastases, some of which appear mildly decreased in size, and some of which appear mildly increased in size. No definite new hepatic lesions.  3. Intervally decrease in size of some of the soft tissue nodules noted in the bilateral perirenal fat, and in the abdominal lymphadenopathy, to include the gastrohepatic, portacaval and retroperitoneal lymph nodes.  4. Stable low-attenuation lesion in the superior pole of the left kidney as well as urothelial thickening involving the superior calyces.  5. 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  Electronically Reviewed by: Ignacia Palma, MD Electronically Reviewed on: 04/25/2015 10:28 AM  I have reviewed the images and concur with the above findings.  Electronically Signed by: Mali Miller, MD Electronically Signed on: 04/25/2015 7:05 PM"  That evening he developed acute flushing of his face associated with high fever and generalized weakness leading to readmission here the following day. He had one episode of diarrhea shortly after admission but has had normal bowel movements since. He was started on empiric vancomycin and piperacillin tazobactam. He had more fever and sweats last night but is feeling a little bit better. He is not quite as weak and was able to walk in the hall yesterday.    Review of Systems: Constitutional: positive for anorexia, fatigue, fevers, sweats and weight loss, negative for chills Eyes: positive for irritation and redness Ears, nose, mouth, throat, and face: positive for nasal congestion, sore mouth, sore throat and mucositis since starting his FGFR inhibitor Respiratory: positive for cough and sputum, negative for dyspnea on exertion and hemoptysis Cardiovascular:  negative Gastrointestinal: positive for constipation and 1 recent episode of diarrhea, negative for abdominal pain, dysphagia and odynophagia Genitourinary:positive for hesitancy and urinary incontinence, negative for dysuria, frequency and hematuria Integument/breast: positive for pruritus, negative for dryness Hematologic/lymphatic: negative for bleeding and easy bruising Musculoskeletal:positive for back pain Neurological: positive for memory problems and weakness, negative for dizziness, gait problems, seizures and speech problems  Past Medical History  Diagnosis Date  . Atrial flutter 05/2009    STATUS POST CARDIOVERSION;  s/p RFCA in 2012 (Dr. Caryl Comes)  . Depression   . History of immunotherapy 2014    "had many times before and it worked; had a severe reaction 01/10/2013; they coded me, I had sepsis; almost died" (08-03-2013)  . OSA on CPAP   . Pneumonia   . CAD (coronary artery disease)     a. NSTEMI 07/2013:  LHC (08/03/13): mLAD 95% assoc w/ thrombus, dLAD 50%, pRCA 80%, mRCA 50%, normal LVF. => PCI:  Rebel (4 x 12 mm) BMS to the mLAD and Rebel (4 x 15 mm) BMS to the pRCA.  . Osteoarthritis   . Dyslipidemia     Lipitor started at time of NSTEMI 07/2013  .  Bladder cancer     'started chemo 07/2013 " (08/02/2013)    Social History  Substance Use Topics  . Smoking status: Former Smoker -- 1.50 packs/day for 15 years    Types: Cigarettes  . Smokeless tobacco: Never Used     Comment: 08/02/2013 "quit smoking in 1984"  . Alcohol Use: No     Comment: 10/13/2014 "hx of abuse; clean since 05/29/1981"    Family History  Problem Relation Age of Onset  . Heart failure Father   . Stroke Father   . Kidney failure Mother   . Coronary artery disease     Allergies  Allergen Reactions  . Benadryl [Diphenhydramine Hcl] Other (See Comments)    Pt becomes very disoriented, extremely tired, memory loss  . Morphine Nausea Only    OK if given slowly.  . Other Other (See Comments)     Patient states when he takes flomax and protonix together makes him break out in a rash  . Adhesive [Tape] Itching and Rash    Blisters, tears skin  . Bupropion Hives and Rash  . Flomax [Tamsulosin Hcl] Rash    When taken with protonix only patient can take this alone  . Pantoprazole Sodium Rash    When taken with flomax only patient can take this alone  . Tamsulosin Rash    When taken with protonix only patient can take this alone    OBJECTIVE: Blood pressure 101/54, pulse 71, temperature 99.6 F (37.6 C), temperature source Oral, resp. rate 18, height 5\' 8"  (1.727 m), weight 222 lb (100.699 kg), SpO2 97 %. General: His weight is down 31 pounds since I last saw him in February. His wife, Linna Darner, son, Marvelle, and daughter, Cloyde Reams, are present. Skin: He has diffuse erythema over his cheeks and eyelids. He has splotchy scars left over from his severe allergic skin rash last year. He has brown patches of stasis dermatitis on his lower legs. He has a crusted callus in the midline of his upper abdomen where he had a recent biopsy performed Lymph nodes: Rubbery and nontender axillary adenopathy Eyes: Conjunctival injection bilaterally Oral: Diffuse redness and dryness. No thrush noted Lungs: Clear Cor: Regular S1 and S2 with no murmurs Chest: Right anterior chest Port-A-Cath site appears normal Abdomen: Obese, soft and nontender. No palpable masses Joints and extremities: No acute abnormalities Neuro: He is alert but slow to answer questions. He occasionally loses his train of thought. However his answers are appropriate. He does not appear confused Mood: He does not appear anxious or depressed  Lab Results Lab Results  Component Value Date   WBC 5.1 04/28/2015   HGB 8.5* 04/28/2015   HCT 26.9* 04/28/2015   MCV 87.1 04/28/2015   PLT 195 04/28/2015    Lab Results  Component Value Date   CREATININE 1.44* 04/28/2015   BUN 18 04/28/2015   NA 128* 04/28/2015   K 3.9 04/28/2015   CL 98*  04/28/2015   CO2 23 04/28/2015    Lab Results  Component Value Date   ALT 13* 04/26/2015   AST 24 04/26/2015   ALKPHOS 96 04/26/2015   BILITOT 0.6 04/26/2015     Microbiology: Recent Results (from the past 240 hour(s))  Culture, blood (routine x 2)     Status: None (Preliminary result)   Collection Time: 04/26/15  9:27 PM  Result Value Ref Range Status   Specimen Description BLOOD LEFT ARM  Final   Special Requests BOTTLES DRAWN AEROBIC AND ANAEROBIC 10CC  Final   Culture PENDING  Incomplete   Report Status PENDING  Incomplete  Culture, blood (routine x 2)     Status: None (Preliminary result)   Collection Time: 04/26/15  9:33 PM  Result Value Ref Range Status   Specimen Description BLOOD LEFT HAND  Final   Special Requests BOTTLES DRAWN AEROBIC ONLY 10CC  Final   Culture NO GROWTH 1 DAY  Final   Report Status PENDING  Incomplete  Urine culture     Status: None (Preliminary result)   Collection Time: 04/26/15 11:30 PM  Result Value Ref Range Status   Specimen Description URINE, CATHETERIZED  Final   Special Requests NONE  Final   Culture CULTURE REINCUBATED FOR BETTER GROWTH  Final   Report Status PENDING  Incomplete    Michel Bickers, MD [facility] [phone] pager   [phone] cell 04/28/2015, 3:06 PM

## 2015-04-28 NOTE — Progress Notes (Signed)
Pt has home unit and needs no assistance donning/doffing unit. VSS and NAD noted.

## 2015-04-28 NOTE — Progress Notes (Signed)
James [name] HBZ:[id number] DOB: [birth year] DOA: 04/26/2015 PCP: Dione Plover, MD  Brief narrative: 79 y/o ? Metastatic bladder cancer diagnosed 1996 with superficial bladder cancer -Became metastatic 06/2013 See progress note 12/28/14  followed at [facility]-recently seen by his oncologist Dr. Siri Cole (on phase I study F GFR inhibitor for restaging as well as clinical trial medications) -He has been recommended hospice in the past if not wanting any specific therapy CAD status post stent 08/04/13 LAD and proximal RCA A flutter status post ablation 2012-not apparently on any anticoagulation currently OSA on CPAP Hyperlipidemia  Admitted 9/16 with temperature 101.6, tachycardia tachypnea and a 2 day history of feeling this way He enrolled at [facility] in phase I trial July 2016  Workup revealed no pneumonia and only nasal congestion    Past medical history-As per Problem list Chart reviewed as below- nad  Consultants:  None yet  Procedures:  None  Antibiotics:  Vancomycin, Zosyn 9/16   Subjective  Doing fair Tolerating diet a little better Tells me his chemotherapy has caused mucositis in the past Wife at the bedside No lower extremity edema No chest pain No cough No blurred vision No rash No lower extremity edema No pain in the chest at the port   Objective     Objective: Filed Vitals:   04/27/15 2200 04/27/15 2336 04/28/15 0242 04/28/15 0634  BP:    113/56  Pulse:    70  Temp: 101.3 F (38.5 C)  98.1 F (36.7 C) 98.3 F (36.8 C)  TempSrc:   Oral Oral  Resp:  18  18  Height:      Weight:      SpO2:    97%    Intake/Output Summary (Last 24 hours) at 04/28/15 0958 Last data filed at 04/28/15 0931  Gross per 24 hour  Intake    240 ml  Output    775 ml  Net   -535 ml    Exam:  General: EOMI NCAT Cardiovascular: S1-S2 no murmur rub or gallop Respiratory: Clinically clear no added sound-port site appears clean without any  swelling or redness Abdomen: Soft nontender nondistended Skin no lower extremity edema  Neuro intact  Data Reviewed: Basic Metabolic Panel:  Recent Labs Lab 04/26/15 2134 04/27/15 0605 04/28/15 0528  NA 131* 128* 128*  K 4.2 4.0 3.9  CL 99* 96* 98*  CO2 _0 GLUCOSE 117* 122* 102*  BUN _1 CREATININE 1.16 1.19 1.44*  CALCIUM 9.0 8.3* 8.2*   Liver Function Tests:  Recent Labs Lab 04/26/15 2134  AST 24  ALT 13*  ALKPHOS 96  BILITOT 0.6  PROT 7.7  ALBUMIN 3.4*   No results for input(s): LIPASE, AMYLASE in the last 168 hours. No results for input(s): AMMONIA in the last 168 hours. CBC:  Recent Labs Lab 04/26/15 2134 04/27/15 0605 04/28/15 0528  WBC 7.2 6.1 5.1  NEUTROABS 5.6  --   --   HGB 10.3* 9.4* 8.5*  HCT 32.6* 29.2* 26.9*  MCV 87.6 86.6 87.1  PLT 238 200 195   Cardiac Enzymes: No results for input(s): CKTOTAL, CKMB, CKMBINDEX, TROPONINI in the last 168 hours. BNP: Invalid input(s): POCBNP CBG: No results for input(s): GLUCAP in the last 168 hours.  Recent Results (from the past 240 hour(s))  Culture, blood (routine x 2)     Status: None (Preliminary result)   Collection Time: 04/26/15  9:27 PM  Result Value Ref Range Status  Specimen Description BLOOD LEFT ARM  Final   Special Requests BOTTLES DRAWN AEROBIC AND ANAEROBIC 10CC  Final   Culture PENDING  Incomplete   Report Status PENDING  Incomplete     Studies:              All Imaging reviewed and is as per above notation   Scheduled Meds: . acidophilus  1 capsule Oral Daily  . carvedilol  6.25 mg Oral BID WC  . enoxaparin (LOVENOX) injection  40 mg Subcutaneous Daily  . feeding supplement  1 Container Oral BID BM  . feeding supplement (ENSURE ENLIVE)  237 mL Oral BID BM  . morphine  15 mg Oral BID  . piperacillin-tazobactam (ZOSYN)  IV  3.375 g Intravenous Q8H  . polyethylene glycol  17 g Oral Daily  . sertraline  150 mg Oral Daily  . vancomycin  750 mg Intravenous Q12H    . venlafaxine XR  75 mg Oral Q breakfast   Continuous Infusions:    Assessment/Plan:    sepsis? Cause Unclear etiology -Continue vancomycin/Zosyn started 9/16 -Because of cutaneous lupus, reasonable to get ESR CRP, complement levels as well as ANA to monitor disease activity as there may be fever from autoimmune process -The patient's port does not seem infected grossly to my exam but that is the only other source I can think of -4 the patient's mucositis patient can continue their own magic mouthwash without Benadryl if needed -I have asked Dr. Megan Salon to see him on a non-emergent basis and greatly appreciat input   Metastatic bladder cancer diagnosed 1996 now on experimental phase I therapy F GFR inhibitor -Follows with [facility] Siri [facility] at [facility] -Has an outpatient follow-up next Wednesday  Atrial flutter status post ablation 2012 not on anticoagulation -Monitor at present time -Continue Coreg 6.25 twice a day  OSA on CPAP -Use CPAP machine at night  Hyperlipidemia -Not sure if there is any mortality benefit -Consider discontinuation as an outpatient off of this medication   Bipolar -Continue sertraline 150 daily  Cutaeous lupus -unclea rif this is causing his symtpoms -see above discussion -could conisde rlow dose steroid therapy? -immunomodulator/biologic therapy might worsen this   Verneita Griffes, MD  [facility] Pager [phone] 04/28/2015, 9:58 AM    LOS: 1 day

## 2015-04-28 NOTE — Progress Notes (Addendum)
ANTIBIOTIC CONSULT NOTE - FOLLOW UP  Pharmacy Consult for Vancomycin and Zosyn Indication: rule out sepsis  Allergies  Allergen Reactions  . Benadryl [Diphenhydramine Hcl] Other (See Comments)    Pt becomes very disoriented, extremely tired, memory loss  . Morphine Nausea Only    OK if given slowly.  . Other Other (See Comments)    Patient states when he takes flomax and protonix together makes him break out in a rash  . Adhesive [Tape] Itching and Rash    Blisters, tears skin  . Bupropion Hives and Rash  . Flomax [Tamsulosin Hcl] Rash    When taken with protonix only patient can take this alone  . Pantoprazole Sodium Rash    When taken with flomax only patient can take this alone  . Tamsulosin Rash    When taken with protonix only patient can take this alone    Patient Measurements: Height: 5\' 8"  (172.7 cm) Weight: 222 lb (100.699 kg) IBW/kg (Calculated) : 68.4  Vital Signs: Temp: 98.3 F (36.8 C) (09/17 0634) Temp Source: Oral (09/17 0634) BP: 113/56 mmHg (09/17 0634) Pulse Rate: 70 (09/17 0634) Intake/Output from previous day: 09/16 0701 - 09/17 0700 In: 480 [P.O.:480] Out: 400 [Urine:400] Intake/Output from this shift: Total I/O In: -  Out: 375 [Urine:375]  Labs:  Recent Labs  04/26/15 2134 04/27/15 0605 04/28/15 0528  WBC 7.2 6.1 5.1  HGB 10.3* 9.4* 8.5*  PLT 238 200 195  CREATININE 1.16 1.19 1.44*   Estimated Creatinine Clearance: 49.4 mL/min (by C-G formula based on Cr of 1.44). No results for input(s): VANCOTROUGH, VANCOPEAK, VANCORANDOM, GENTTROUGH, GENTPEAK, GENTRANDOM, TOBRATROUGH, TOBRAPEAK, TOBRARND, AMIKACINPEAK, AMIKACINTROU, AMIKACIN in the last 72 hours.   Microbiology: Recent Results (from the past 720 hour(s))  Culture, blood (routine x 2)     Status: None (Preliminary result)   Collection Time: 04/26/15  9:27 PM  Result Value Ref Range Status   Specimen Description BLOOD LEFT ARM  Final   Special Requests BOTTLES DRAWN AEROBIC AND  ANAEROBIC 10CC  Final   Culture PENDING  Incomplete   Report Status PENDING  Incomplete    Anti-infectives    Start     Dose/Rate Route Frequency Ordered Stop   04/27/15 0700  piperacillin-tazobactam (ZOSYN) IVPB 3.375 g     3.375 g 12.5 mL/hr over 240 Minutes Intravenous Every 8 hours 04/27/15 0304     04/27/15 0330  vancomycin (VANCOCIN) IVPB 750 mg/150 ml premix     750 mg 150 mL/hr over 60 Minutes Intravenous Every 12 hours 04/27/15 0304     04/26/15 2330  piperacillin-tazobactam (ZOSYN) IVPB 3.375 g     3.375 g 100 mL/hr over 30 Minutes Intravenous  Once 04/26/15 2326 04/27/15 0123   04/26/15 2330  vancomycin (VANCOCIN) IVPB 1000 mg/200 mL premix     1,000 mg 200 mL/hr over 60 Minutes Intravenous  Once 04/26/15 2326 04/27/15 0225      Assessment: 79 year old male with metastatic bladder cancer admitted with fever and possible infection.  He is on day #3 of Vancomycin and Zosyn.  Note his SCr is up from admission and his Vancomycin regimen requires adjustment.  Goal of Therapy:  Vancomycin trough level 15-20 mcg/ml  Plan:  Change Vancomycin to 1250mg  IV q24h Continue Zosyn 3.375gm IV q8h extended infusion Monitor renal function closely Check Vancomycin trough at steady state Follow available micro data  Legrand Como, Pharm.D., BCPS, AAHIVP Clinical Pharmacist Phone: [phone] or [phone] 04/28/2015, 10:14 AM   Changing  Zosyn to Ceftazidime Ceftazidime 2 g IV q12h  Harvel Quale  04/28/2015 3:46 PM

## 2015-04-29 DIAGNOSIS — I483 Typical atrial flutter: Secondary | ICD-10-CM

## 2015-04-29 DIAGNOSIS — A409 Streptococcal sepsis, unspecified: Secondary | ICD-10-CM

## 2015-04-29 LAB — CBC WITH DIFFERENTIAL/PLATELET
BASOS ABS: 0 10*3/uL (ref 0.0–0.1)
BASOS PCT: 0 %
Eosinophils Absolute: 0.2 10*3/uL (ref 0.0–0.7)
Eosinophils Relative: 4 %
HCT: 26.3 % — ABNORMAL LOW (ref 39.0–52.0)
HEMOGLOBIN: 8.5 g/dL — AB (ref 13.0–17.0)
Lymphocytes Relative: 11 %
Lymphs Abs: 0.6 10*3/uL — ABNORMAL LOW (ref 0.7–4.0)
MCH: 27.7 pg (ref 26.0–34.0)
MCHC: 32.3 g/dL (ref 30.0–36.0)
MCV: 85.7 fL (ref 78.0–100.0)
Monocytes Absolute: 0.6 10*3/uL (ref 0.1–1.0)
Monocytes Relative: 12 %
NEUTROS ABS: 3.6 10*3/uL (ref 1.7–7.7)
NEUTROS PCT: 73 %
Platelets: 183 10*3/uL (ref 150–400)
RBC: 3.07 MIL/uL — AB (ref 4.22–5.81)
RDW: 15.7 % — ABNORMAL HIGH (ref 11.5–15.5)
WBC: 5 10*3/uL (ref 4.0–10.5)

## 2015-04-29 LAB — URINE CULTURE

## 2015-04-29 LAB — COMPREHENSIVE METABOLIC PANEL
ALBUMIN: 2.5 g/dL — AB (ref 3.5–5.0)
ALK PHOS: 64 U/L (ref 38–126)
ALT: 12 U/L — AB (ref 17–63)
AST: 23 U/L (ref 15–41)
Anion gap: 7 (ref 5–15)
BILIRUBIN TOTAL: 0.6 mg/dL (ref 0.3–1.2)
BUN: 14 mg/dL (ref 6–20)
CALCIUM: 8.1 mg/dL — AB (ref 8.9–10.3)
CO2: 22 mmol/L (ref 22–32)
CREATININE: 1.28 mg/dL — AB (ref 0.61–1.24)
Chloride: 99 mmol/L — ABNORMAL LOW (ref 101–111)
GFR calc Af Amer: >60 mL/min (ref >60)
GFR calc non Af Amer: 52 mL/min — ABNORMAL LOW (ref >60)
GLUCOSE: 109 mg/dL — AB (ref 65–99)
POTASSIUM: 3.8 mmol/L (ref 3.5–5.1)
Sodium: 128 mmol/L — ABNORMAL LOW (ref 135–145)
TOTAL PROTEIN: 6.2 g/dL — AB (ref 6.5–8.1)

## 2015-04-29 LAB — C4 COMPLEMENT: COMPLEMENT C4, BODY FLUID: 19 mg/dL (ref 14–44)

## 2015-04-29 LAB — COMPLEMENT, TOTAL: Compl, Total (CH50): >60 U/mL (ref 42–60)

## 2015-04-29 LAB — C3 COMPLEMENT: C3 COMPLEMENT: 105 mg/dL (ref 82–167)

## 2015-04-29 MED ORDER — ACETAMINOPHEN 325 MG PO TABS
650.0000 mg | ORAL_TABLET | Freq: Four times a day (QID) | ORAL | Status: DC | PRN
  Administered 2015-04-29 – 2015-05-01 (×3): 650 mg via ORAL
  Filled 2015-04-29 (×4): qty 2

## 2015-04-29 NOTE — Progress Notes (Signed)
Pt will place on himself when ready. Pt encouraged to call RT if needing any assistance.

## 2015-04-29 NOTE — Progress Notes (Signed)
James [name] XBL:[id number] DOB: 1936/03/24 DOA: 04/26/2015 PCP: Dione Plover, MD  Brief narrative: 79 y/o ? Metastatic bladder cancer diagnosed 1996 with superficial bladder cancer -Became metastatic 06/2013 See progress note 12/28/14  followed at [facility]-recently seen by his oncologist Dr. Siri Cole (on phase I study F GFR inhibitor for restaging as well as clinical trial medications) -He has been recommended hospice in the past if not wanting any specific therapy CAD status post stent 08/04/13 LAD and proximal RCA A flutter status post ablation 2012-not apparently on any anticoagulation currently OSA on CPAP Hyperlipidemia  Admitted 9/16 with temperature 101.6, tachycardia tachypnea and a 2 day history of feeling this way He enrolled at [facility] in phase I trial July 2016  Workup revealed no pneumonia and only nasal congestion    Past medical history-As per Problem list Chart reviewed as below- nad  Consultants:  None yet  Procedures:  None  Antibiotics:  Vancomycin, Zosyn 9/16  Narrowed to vancomycin and Ceftataz 9/17   Subjective   Low-grade temps overnight Today seems to be doing a little better Family has been rinsing out his nose with this sinus rinse   Objective     Objective: Filed Vitals:   04/28/15 2150 04/29/15 0527 04/29/15 0757 04/29/15 1335  BP: 107/52 122/58  110/59  Pulse: 65 73  60  Temp: 98.5 F (36.9 C) 99.9 F (37.7 C) 98.8 F (37.1 C) 98 F (36.7 C)  TempSrc: Axillary Oral  Axillary  Resp: _0 Height:      Weight:      SpO2: 96% 98%  98%    Intake/Output Summary (Last 24 hours) at 04/29/15 1408 Last data filed at 04/29/15 0947  Gross per 24 hour  Intake   1850 ml  Output    650 ml  Net   1200 ml    Exam:  General: EOMI NCAT-a little sleepy today Cardiovascular: S1-S2 no murmur rub or gallop Respiratory: Clinically clear no added sound-port site appears clean without any swelling or  redness Abdomen: Soft nontender nondistended Skin no lower extremity edema  Neuro intact  Data Reviewed: Basic Metabolic Panel:  Recent Labs Lab 04/26/15 2134 04/27/15 0605 04/28/15 0528 04/29/15 0724  NA 131* 128* 128* 128*  K 4.2 4.0 3.9 3.8  CL 99* 96* 98* 99*  CO2 _1 GLUCOSE 117* 122* 102* 109*  BUN _2 CREATININE 1.16 1.19 1.44* 1.28*  CALCIUM 9.0 8.3* 8.2* 8.1*   Liver Function Tests:  Recent Labs Lab 04/26/15 2134 04/29/15 0724  AST 24 23  ALT 13* 12*  ALKPHOS 96 64  BILITOT 0.6 0.6  PROT 7.7 6.2*  ALBUMIN 3.4* 2.5*   No results for input(s): LIPASE, AMYLASE in the last 168 hours. No results for input(s): AMMONIA in the last 168 hours. CBC:  Recent Labs Lab 04/26/15 2134 04/27/15 0605 04/28/15 0528 04/29/15 0724  WBC 7.2 6.1 5.1 5.0  NEUTROABS 5.6  --   --  3.6  HGB 10.3* 9.4* 8.5* 8.5*  HCT 32.6* 29.2* 26.9* 26.3*  MCV 87.6 86.6 87.1 85.7  PLT 238 200 195 183   Cardiac Enzymes: No results for input(s): CKTOTAL, CKMB, CKMBINDEX, TROPONINI in the last 168 hours. BNP: Invalid input(s): POCBNP CBG: No results for input(s): GLUCAP in the last 168 hours.  Recent Results (from the past 240 hour(s))  Culture, blood (routine x 2)     Status: None (Preliminary result)  Collection Time: 04/26/15  9:27 PM  Result Value Ref Range Status   Specimen Description BLOOD LEFT ARM  Final   Special Requests BOTTLES DRAWN AEROBIC AND ANAEROBIC 10CC  Final   Culture PENDING  Incomplete   Report Status PENDING  Incomplete  Culture, blood (routine x 2)     Status: None (Preliminary result)   Collection Time: 04/26/15  9:33 PM  Result Value Ref Range Status   Specimen Description BLOOD LEFT HAND  Final   Special Requests BOTTLES DRAWN AEROBIC ONLY 10CC  Final   Culture NO GROWTH 2 DAYS  Final   Report Status PENDING  Incomplete  Urine culture     Status: None   Collection Time: 04/26/15 11:30 PM  Result Value Ref Range Status   Specimen  Description URINE, CATHETERIZED  Final   Special Requests NONE  Final   Culture MULTIPLE SPECIES PRESENT, SUGGEST RECOLLECTION  Final   Report Status 04/29/2015 FINAL  Final  Culture, blood (routine x 2)     Status: None (Preliminary result)   Collection Time: 04/28/15  1:00 PM  Result Value Ref Range Status   Specimen Description BLOOD RIGHT ANTECUBITAL  Final   Special Requests BOTTLES DRAWN AEROBIC AND ANAEROBIC 10CC  Final   Culture NO GROWTH < 24 HOURS  Final   Report Status PENDING  Incomplete  Culture, blood (routine x 2)     Status: None (Preliminary result)   Collection Time: 04/28/15  1:15 PM  Result Value Ref Range Status   Specimen Description BLOOD LAC  Final   Special Requests BOTTLES DRAWN AEROBIC AND ANAEROBIC 10CC  Final   Culture NO GROWTH < 24 HOURS  Final   Report Status PENDING  Incomplete     Studies:              All Imaging reviewed and is as per above notation   Scheduled Meds: . acidophilus  1 capsule Oral Daily  . carvedilol  6.25 mg Oral BID WC  . cefTAZidime (FORTAZ)  IV  2 g Intravenous Q12H  . enoxaparin (LOVENOX) injection  40 mg Subcutaneous Daily  . feeding supplement  1 Container Oral BID BM  . feeding supplement (ENSURE ENLIVE)  237 mL Oral BID BM  . morphine  15 mg Oral BID  . polyethylene glycol  17 g Oral Daily  . sertraline  150 mg Oral Daily  . vancomycin  1,250 mg Intravenous Q24H  . venlafaxine XR  75 mg Oral Q breakfast   Continuous Infusions: . sodium chloride 75 mL/hr at 04/29/15 0421     Assessment/Plan:    sepsis? Cause Unclear etiology -Index blood cultures 9/15 still pending -Urine culture shows multiple columns therefore probably not -Blood cultures from 9/17 pending -Continue vancomycin/Zosyn started 9/16 was narrowed to ceftaz by ID - ESR CRP are elevated but this is nonspecific we await, complement levels as well as ANA to monitor disease activity as there may be fever from autoimmune process -The patient's port  does not seem infected grossly to my exam but that is the only other source I can think of -4 the patient's mucositis patient can continue their own magic mouthwash without Benadryl if needed -Appreciate Dr. Megan Salon input  Metastatic bladder cancer diagnosed 1996 now on experimental phase I therapy F GFR inhibitor -Follows with Dr. 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 -immunomodulator/biologic therapy might worsen this  Discussed with family at the bedside Stabilizing and awaiting further speciation of cultures Workup for cutaneous lupus/lupus activity can be followed as an outpatient  Verneita Griffes, MD  [facility] Pager [phone] 04/29/2015, 2:08 PM    LOS: 2 days

## 2015-04-29 NOTE — Progress Notes (Signed)
Patient ID: James [name], male   DOB: 10/01/1935, 79 y.o.   MRN: [id number]         [facility] for Infectious Disease    Date of Admission:  04/26/2015   Total days of antibiotics 4          Principal Problem:   Fever Active Problems:   Sepsis   Metastasis from bladder cancer   Atrial flutter   Sleep apnea   Depression   Hyponatremia   Normocytic anemia   Cutaneous lupus erythematosus   Cancer associated pain   Neoplastic malignant related fatigue   Generalized weakness   Anemia   Protein-calorie malnutrition, severe   . acidophilus  1 capsule Oral Daily  . carvedilol  6.25 mg Oral BID WC  . cefTAZidime (FORTAZ)  IV  2 g Intravenous Q12H  . enoxaparin (LOVENOX) injection  40 mg Subcutaneous Daily  . feeding supplement  1 Container Oral BID BM  . feeding supplement (ENSURE ENLIVE)  237 mL Oral BID BM  . morphine  15 mg Oral BID  . polyethylene glycol  17 g Oral Daily  . sertraline  150 mg Oral Daily  . vancomycin  1,250 mg Intravenous Q24H  . venlafaxine XR  75 mg Oral Q breakfast    SUBJECTIVE: He feels like he is doing about the same. He is very sleepy today. Last night he sat up for about an hour and a half. He had rigors lasting about 20 minutes last night. He has not had any documented fever overnight. He had some loose bowel movements this morning after taking MiraLAX.  Review of Systems: Pertinent items are noted in HPI.  Past Medical History  Diagnosis Date  . Atrial flutter 05/2009    STATUS POST CARDIOVERSION;  s/p RFCA in 2012 (Dr. Caryl Comes)  . Depression   . History of immunotherapy 2014    "had many times before and it worked; had a severe reaction 01/26/2013; they coded me, I had sepsis; almost died" (08-19-2013)  . OSA on CPAP   . Pneumonia   . CAD (coronary artery disease)     a. NSTEMI 07/2013:  LHC (08/03/13): mLAD 95% assoc w/ thrombus, dLAD 50%, pRCA 80%, mRCA 50%, normal LVF. => PCI:  Rebel (4 x 12 mm) BMS to the mLAD and Rebel (4 x 15 mm) BMS  to the pRCA.  . Osteoarthritis   . Dyslipidemia     Lipitor started at time of NSTEMI 07/2013  . Bladder cancer     'started chemo 07/2013 " (19-Aug-2013)    Social History  Substance Use Topics  . Smoking status: Former Smoker -- 1.50 packs/day for 15 years    Types: Cigarettes  . Smokeless tobacco: Never Used     Comment: 08-19-13 "quit smoking in 1984"  . Alcohol Use: No     Comment: 10/13/2014 "hx of abuse; clean since 05/29/1981"    Family History  Problem Relation Age of Onset  . Heart failure Father   . Stroke Father   . Kidney failure Mother   . Coronary artery disease     Allergies  Allergen Reactions  . Benadryl [Diphenhydramine Hcl] Other (See Comments)    Pt becomes very disoriented, extremely tired, memory loss  . Morphine Nausea Only    OK if given slowly.  . Other Other (See Comments)    Patient states when he takes flomax and protonix together makes him break out in a rash  . Adhesive [Tape] Itching  and Rash    Blisters, tears skin  . Bupropion Hives and Rash  . Flomax [Tamsulosin Hcl] Rash    When taken with protonix only patient can take this alone  . Pantoprazole Sodium Rash    When taken with flomax only patient can take this alone  . Tamsulosin Rash    When taken with protonix only patient can take this alone    OBJECTIVE: Filed Vitals:   04/28/15 2150 04/29/15 0527 04/29/15 0757 04/29/15 1335  BP: 107/52 122/58  110/59  Pulse: 65 73  60  Temp: 98.5 F (36.9 C) 99.9 F (37.7 C) 98.8 F (37.1 C) 98 F (36.7 C)  TempSrc: Axillary Oral  Axillary  Resp: 17 18  18   Height:      Weight:      SpO2: 96% 98%  98%   Body mass index is 33.76 kg/(m^2).  General: He drifts off to sleep during my exam. His wife and daughter are present Skin: His cheeks are not quite is flushed as yesterday Lungs: Clear Cor: Regular S1 and S2 with no murmurs Chest: Port-A-Cath site looks good Abdomen: Soft and nontender  Lab Results Lab Results  Component  Value Date   WBC 5.0 04/29/2015   HGB 8.5* 04/29/2015   HCT 26.3* 04/29/2015   MCV 85.7 04/29/2015   PLT 183 04/29/2015    Lab Results  Component Value Date   CREATININE 1.28* 04/29/2015   BUN 14 04/29/2015   NA 128* 04/29/2015   K 3.8 04/29/2015   CL 99* 04/29/2015   CO2 22 04/29/2015    Lab Results  Component Value Date   ALT 12* 04/29/2015   AST 23 04/29/2015   ALKPHOS 64 04/29/2015   BILITOT 0.6 04/29/2015     Microbiology: Recent Results (from the past 240 hour(s))  Culture, blood (routine x 2)     Status: None (Preliminary result)   Collection Time: 04/26/15  9:27 PM  Result Value Ref Range Status   Specimen Description BLOOD LEFT ARM  Final   Special Requests BOTTLES DRAWN AEROBIC AND ANAEROBIC 10CC  Final   Culture NO GROWTH 2 DAYS  Final   Report Status PENDING  Incomplete  Culture, blood (routine x 2)     Status: None (Preliminary result)   Collection Time: 04/26/15  9:33 PM  Result Value Ref Range Status   Specimen Description BLOOD LEFT HAND  Final   Special Requests BOTTLES DRAWN AEROBIC ONLY 10CC  Final   Culture NO GROWTH 2 DAYS  Final   Report Status PENDING  Incomplete  Urine culture     Status: None   Collection Time: 04/26/15 11:30 PM  Result Value Ref Range Status   Specimen Description URINE, CATHETERIZED  Final   Special Requests NONE  Final   Culture MULTIPLE SPECIES PRESENT, SUGGEST RECOLLECTION  Final   Report Status 04/29/2015 FINAL  Final  Culture, blood (routine x 2)     Status: None (Preliminary result)   Collection Time: 04/28/15  1:00 PM  Result Value Ref Range Status   Specimen Description BLOOD RIGHT ANTECUBITAL  Final   Special Requests BOTTLES DRAWN AEROBIC AND ANAEROBIC 10CC  Final   Culture NO GROWTH 1 DAY  Final   Report Status PENDING  Incomplete  Culture, blood (routine x 2)     Status: None (Preliminary result)   Collection Time: 04/28/15  1:15 PM  Result Value Ref Range Status   Specimen Description BLOOD LAC  Final  Special Requests BOTTLES DRAWN AEROBIC AND ANAEROBIC 10CC  Final   Culture NO GROWTH 1 DAY  Final   Report Status PENDING  Incomplete     ASSESSMENT: The cause of his fever remains unclear.  PLAN: 1. Continue vancomycin and ceftazidime  Michel Bickers, MD [facility] [phone] pager   [phone] cell 04/29/2015, 2:16 PM

## 2015-04-30 DIAGNOSIS — K1231 Oral mucositis (ulcerative) due to antineoplastic therapy: Secondary | ICD-10-CM

## 2015-04-30 DIAGNOSIS — E43 Unspecified severe protein-calorie malnutrition: Secondary | ICD-10-CM

## 2015-04-30 DIAGNOSIS — C799 Secondary malignant neoplasm of unspecified site: Secondary | ICD-10-CM

## 2015-04-30 DIAGNOSIS — K1230 Oral mucositis (ulcerative), unspecified: Secondary | ICD-10-CM

## 2015-04-30 DIAGNOSIS — L931 Subacute cutaneous lupus erythematosus: Secondary | ICD-10-CM

## 2015-04-30 DIAGNOSIS — A400 Sepsis due to streptococcus, group A: Secondary | ICD-10-CM

## 2015-04-30 DIAGNOSIS — R509 Fever, unspecified: Secondary | ICD-10-CM

## 2015-04-30 LAB — CBC WITH DIFFERENTIAL/PLATELET
BASOS ABS: 0 10*3/uL (ref 0.0–0.1)
Basophils Relative: 0 %
EOS PCT: 6 %
Eosinophils Absolute: 0.3 10*3/uL (ref 0.0–0.7)
HCT: 27.2 % — ABNORMAL LOW (ref 39.0–52.0)
Hemoglobin: 8.7 g/dL — ABNORMAL LOW (ref 13.0–17.0)
LYMPHS PCT: 12 %
Lymphs Abs: 0.6 10*3/uL — ABNORMAL LOW (ref 0.7–4.0)
MCH: 27.4 pg (ref 26.0–34.0)
MCHC: 32 g/dL (ref 30.0–36.0)
MCV: 85.8 fL (ref 78.0–100.0)
Monocytes Absolute: 0.5 10*3/uL (ref 0.1–1.0)
Monocytes Relative: 11 %
NEUTROS PCT: 71 %
Neutro Abs: 3.4 10*3/uL (ref 1.7–7.7)
PLATELETS: 196 10*3/uL (ref 150–400)
RBC: 3.17 MIL/uL — AB (ref 4.22–5.81)
RDW: 15.5 % (ref 11.5–15.5)
WBC: 4.7 10*3/uL (ref 4.0–10.5)

## 2015-04-30 MED ORDER — GELCLAIR MT GEL
1.0000 | Freq: Three times a day (TID) | OROMUCOSAL | Status: DC
  Administered 2015-04-30 – 2015-05-04 (×7): 1 via ORAL
  Filled 2015-04-30 (×15): qty 1

## 2015-04-30 NOTE — Progress Notes (Signed)
James [name] EKC:[id number] DOB: May 28, 1936 DOA: 04/26/2015 PCP: Dione Plover, MD  Brief narrative: 79 y/o ? Metastatic bladder cancer diagnosed 1996 with superficial bladder cancer -Became metastatic 06/2013 See progress note 12/28/14  followed at [facility]-recently seen by his oncologist Dr. Siri Cole (on phase I study F GFR inhibitor for restaging as well as clinical trial medications) -He has been recommended hospice in the past if not wanting any specific therapy CAD status post stent 08/04/13 LAD and proximal RCA A flutter status post ablation 2012-not apparently on any anticoagulation currently OSA on CPAP Hyperlipidemia  Admitted 9/16 with temperature 101.6, tachycardia tachypnea and a 2 day history of feeling this way He enrolled at [facility] in phase I trial July 2016  Workup revealed no pneumonia and only nasal congestion  He has suffered from mucositis which has been intractable   Past medical history-As per Problem list Chart reviewed as below- nad  Consultants:  None yet  Procedures:  None  Antibiotics:  Vancomycin, Zosyn 9/16  Narrowed to vancomycin and Ceftataz 9/17   Subjective   Continues with nasal congestions Mouth feels sore still despite home regimen and formulation magic mouthwash without benadryl tol water Considering palliative and hospice    Objective     Objective: Filed Vitals:   04/30/15 0536 04/30/15 0833 04/30/15 0902 04/30/15 1044  BP: 119/67  136/68   Pulse: 66  74   Temp: 97.7 F (36.5 C) 98.3 F (36.8 C)  98.1 F (36.7 C)  TempSrc: Axillary Axillary  Oral  Resp: 18     Height:      Weight:      SpO2: 99%       Intake/Output Summary (Last 24 hours) at 04/30/15 1142 Last data filed at 04/30/15 1043  Gross per 24 hour  Intake   2140 ml  Output    550 ml  Net   1590 ml    Exam:  General: EOMI NCAT-mouth has no specific lesions Cardiovascular: S1-S2 no murmur rub or gallop Respiratory:  Clinically clear no added sound-port site appears clean without any swelling or redness Abdomen: Soft nontender nondistended Skin no lower extremity edema  Neuro intact  Data Reviewed: Basic Metabolic Panel:  Recent Labs Lab 04/26/15 2134 04/27/15 0605 04/28/15 0528 04/29/15 0724  NA 131* 128* 128* 128*  K 4.2 4.0 3.9 3.8  CL 99* 96* 98* 99*  CO2 _0 GLUCOSE 117* 122* 102* 109*  BUN _1 CREATININE 1.16 1.19 1.44* 1.28*  CALCIUM 9.0 8.3* 8.2* 8.1*   Liver Function Tests:  Recent Labs Lab 04/26/15 2134 04/29/15 0724  AST 24 23  ALT 13* 12*  ALKPHOS 96 64  BILITOT 0.6 0.6  PROT 7.7 6.2*  ALBUMIN 3.4* 2.5*   No results for input(s): LIPASE, AMYLASE in the last 168 hours. No results for input(s): AMMONIA in the last 168 hours. CBC:  Recent Labs Lab 04/26/15 2134 04/27/15 0605 04/28/15 0528 04/29/15 0724 04/30/15 0529  WBC 7.2 6.1 5.1 5.0 4.7  NEUTROABS 5.6  --   --  3.6 3.4  HGB 10.3* 9.4* 8.5* 8.5* 8.7*  HCT 32.6* 29.2* 26.9* 26.3* 27.2*  MCV 87.6 86.6 87.1 85.7 85.8  PLT 238 200 195 183 196   Cardiac Enzymes: No results for input(s): CKTOTAL, CKMB, CKMBINDEX, TROPONINI in the last 168 hours. BNP: Invalid input(s): POCBNP CBG: No results for input(s): GLUCAP in the last 168 hours.  Recent Results (from the past  240 hour(s))  Culture, blood (routine x 2)     Status: None (Preliminary result)   Collection Time: 04/26/15  9:27 PM  Result Value Ref Range Status   Specimen Description BLOOD LEFT ARM  Final   Special Requests BOTTLES DRAWN AEROBIC AND ANAEROBIC 10CC  Final   Culture NO GROWTH 2 DAYS  Final   Report Status PENDING  Incomplete  Culture, blood (routine x 2)     Status: None (Preliminary result)   Collection Time: 04/26/15  9:33 PM  Result Value Ref Range Status   Specimen Description BLOOD LEFT HAND  Final   Special Requests BOTTLES DRAWN AEROBIC ONLY 10CC  Final   Culture NO GROWTH 2 DAYS  Final   Report Status PENDING   Incomplete  Urine culture     Status: None   Collection Time: 04/26/15 11:30 PM  Result Value Ref Range Status   Specimen Description URINE, CATHETERIZED  Final   Special Requests NONE  Final   Culture MULTIPLE SPECIES PRESENT, SUGGEST RECOLLECTION  Final   Report Status 04/29/2015 FINAL  Final  Culture, blood (routine x 2)     Status: None (Preliminary result)   Collection Time: 04/28/15  1:00 PM  Result Value Ref Range Status   Specimen Description BLOOD RIGHT ANTECUBITAL  Final   Special Requests BOTTLES DRAWN AEROBIC AND ANAEROBIC 10CC  Final   Culture NO GROWTH 1 DAY  Final   Report Status PENDING  Incomplete  Culture, blood (routine x 2)     Status: None (Preliminary result)   Collection Time: 04/28/15  1:15 PM  Result Value Ref Range Status   Specimen Description BLOOD LAC  Final   Special Requests BOTTLES DRAWN AEROBIC AND ANAEROBIC 10CC  Final   Culture NO GROWTH 1 DAY  Final   Report Status PENDING  Incomplete     Studies:              All Imaging reviewed and is as per above notation   Scheduled Meds: . acidophilus  1 capsule Oral Daily  . carvedilol  6.25 mg Oral BID WC  . cefTAZidime (FORTAZ)  IV  2 g Intravenous Q12H  . enoxaparin (LOVENOX) injection  40 mg Subcutaneous Daily  . feeding supplement  1 Container Oral BID BM  . feeding supplement (ENSURE ENLIVE)  237 mL Oral BID BM  . morphine  15 mg Oral BID  . polyethylene glycol  17 g Oral Daily  . sertraline  150 mg Oral Daily  . vancomycin  1,250 mg Intravenous Q24H  . venlafaxine XR  75 mg Oral Q breakfast   Continuous Infusions: . sodium chloride 75 mL/hr at 04/29/15 1851     Assessment/Plan:    sepsis? Cause Unclear etiology -Index blood cultures 9/15 still pending -Urine culture shows multiple columns therefore probably not -Blood cultures from 9/17 NG so far -Continue vancomycin/Zosyn started 9/16 was narrowed to ceftaz by ID - ESR CRP are elevated but this is nonspecific we await, complement  levels as well as ANA to monitor disease activity as there may be fever from autoimmune process -The patient's port does not seem infected grossly to my exam but that is the only other source I can think of -4 the patient's mucositis patient can continue their own magic mouthwash without Benadryl if needed -Appreciate Dr. Megan Salon input  Mucositis Already on his lidocaine I will add Gelclair to see if this helps He has no fever -doubt any opportunistic EBV or other  bug is playing a role His F GFR inhibitor causes mucositis as does renvela  Metastatic bladdercancer diagnosed 1996 now on experimental phase I therapy F GFR inhibitor -Follows with Dr. Siri Cole at [facility] -Has an outpatient follow-up next Wednesday  Atrial flutter status post ablation 2012 not on anticoagulation -Monitor at present time -Continue Coreg 6.25 twice a day  OSA on CPAP -Use CPAP machine at night  Hyperlipidemia -Not sure if there is any mortality benefit -Consider discontinuation as an outpatient off of this medication   Bipolar -Continue sertraline 150 daily  Cutaeous lupus -unclear if this is causing his symptoms -ESr + CRP are high, but Complement levels are wnl decreasing suspicion of overt lupus -see above discussion  -immunomodulator/biologic therapy might worsen this  Discussed with family at the bedside Stabilizing and awaiting further speciation of cultures Delineate Abx per ID Palliative consutled at request of family-Already being seen by them in OP setting and leaning towards [facility]  Verneita Griffes, MD  [facility] Pager [phone] 04/30/2015, 11:42 AM    LOS: 3 days

## 2015-04-30 NOTE — Progress Notes (Addendum)
INFECTIOUS DISEASE PROGRESS NOTE  ID: James [name] is a 79 y.o. male with  Principal Problem:   Fever Active Problems:   Atrial flutter   Sleep apnea   Depression   Hyponatremia   Normocytic anemia   Metastasis from bladder cancer   Cutaneous lupus erythematosus   Cancer associated pain   Neoplastic malignant related fatigue   Sepsis   Generalized weakness   Anemia   Protein-calorie malnutrition, severe  Subjective: Spoke with wife at length Completed last CTX 9-14 Problems with mucositis Sinus fullness   Abtx:  Anti-infectives    Start     Dose/Rate Route Frequency Ordered Stop   04/28/15 2200  vancomycin (VANCOCIN) 1,250 mg in sodium chloride 0.9 % 250 mL IVPB     1,250 mg 166.7 mL/hr over 90 Minutes Intravenous Every 24 hours 04/28/15 1015     04/28/15 2200  cefTAZidime (FORTAZ) 2 g in dextrose 5 % 50 mL IVPB     2 g 100 mL/hr over 30 Minutes Intravenous Every 12 hours 04/28/15 1545     04/27/15 0700  piperacillin-tazobactam (ZOSYN) IVPB 3.375 g  Status:  Discontinued     3.375 g 12.5 mL/hr over 240 Minutes Intravenous Every 8 hours 04/27/15 0304 04/28/15 1531   04/27/15 0330  vancomycin (VANCOCIN) IVPB 750 mg/150 ml premix  Status:  Discontinued     750 mg 150 mL/hr over 60 Minutes Intravenous Every 12 hours 04/27/15 0304 04/28/15 1015   04/26/15 2330  piperacillin-tazobactam (ZOSYN) IVPB 3.375 g     3.375 g 100 mL/hr over 30 Minutes Intravenous  Once 04/26/15 2326 04/27/15 0123   04/26/15 2330  vancomycin (VANCOCIN) IVPB 1000 mg/200 mL premix     1,000 mg 200 mL/hr over 60 Minutes Intravenous  Once 04/26/15 2326 04/27/15 0225      Medications:  Scheduled: . acidophilus  1 capsule Oral Daily  . carvedilol  6.25 mg Oral BID WC  . cefTAZidime (FORTAZ)  IV  2 g Intravenous Q12H  . enoxaparin (LOVENOX) injection  40 mg Subcutaneous Daily  . feeding supplement  1 Container Oral BID BM  . feeding supplement (ENSURE ENLIVE)  237 mL Oral BID BM  . morphine   15 mg Oral BID  . mucosal barrier oral  1 packet Oral TID  . polyethylene glycol  17 g Oral Daily  . sertraline  150 mg Oral Daily  . vancomycin  1,250 mg Intravenous Q24H  . venlafaxine XR  75 mg Oral Q breakfast    Objective: Vital signs in last 24 hours: Temp:  [97.6 F (36.4 C)-100.1 F (37.8 C)] 98.1 F (36.7 C) (09/19 1044) Pulse Rate:  [61-74] 74 (09/19 0902) Resp:  [18] 18 (09/19 0536) BP: (104-136)/(54-68) 136/68 mmHg (09/19 0902) SpO2:  [96 %-99 %] 99 % (09/19 0536)   General appearance: resting quietly, facial erythema  Lab Results  Recent Labs  04/28/15 0528 04/29/15 0724 04/30/15 0529  WBC 5.1 5.0 4.7  HGB 8.5* 8.5* 8.7*  HCT 26.9* 26.3* 27.2*  NA 128* 128*  --   K 3.9 3.8  --   CL 98* 99*  --   CO2 23 22  --   BUN 18 14  --   CREATININE 1.44* 1.28*  --    Liver Panel  Recent Labs  04/29/15 0724  PROT 6.2*  ALBUMIN 2.5*  AST 23  ALT 12*  ALKPHOS 64  BILITOT 0.6   Sedimentation Rate  Recent Labs  04/28/15 1315  ESRSEDRATE 79*   C-Reactive Protein  Recent Labs  04/28/15 1315  CRP 9.0*    Microbiology: Recent Results (from the past 240 hour(s))  Culture, blood (routine x 2)     Status: None (Preliminary result)   Collection Time: 04/26/15  9:27 PM  Result Value Ref Range Status   Specimen Description BLOOD LEFT ARM  Final   Special Requests BOTTLES DRAWN AEROBIC AND ANAEROBIC 10CC  Final   Culture NO GROWTH 3 DAYS  Final   Report Status PENDING  Incomplete  Culture, blood (routine x 2)     Status: None (Preliminary result)   Collection Time: 04/26/15  9:33 PM  Result Value Ref Range Status   Specimen Description BLOOD LEFT HAND  Final   Special Requests BOTTLES DRAWN AEROBIC ONLY 10CC  Final   Culture NO GROWTH 3 DAYS  Final   Report Status PENDING  Incomplete  Urine culture     Status: None   Collection Time: 04/26/15 11:30 PM  Result Value Ref Range Status   Specimen Description URINE, CATHETERIZED  Final   Special  Requests NONE  Final   Culture MULTIPLE SPECIES PRESENT, SUGGEST RECOLLECTION  Final   Report Status 04/29/2015 FINAL  Final  Culture, blood (routine x 2)     Status: None (Preliminary result)   Collection Time: 04/28/15  1:00 PM  Result Value Ref Range Status   Specimen Description BLOOD RIGHT ANTECUBITAL  Final   Special Requests BOTTLES DRAWN AEROBIC AND ANAEROBIC 10CC  Final   Culture NO GROWTH 2 DAYS  Final   Report Status PENDING  Incomplete  Culture, blood (routine x 2)     Status: None (Preliminary result)   Collection Time: 04/28/15  1:15 PM  Result Value Ref Range Status   Specimen Description BLOOD LEFT ANTECUBITAL  Final   Special Requests BOTTLES DRAWN AEROBIC AND ANAEROBIC 10CC  Final   Culture NO GROWTH 2 DAYS  Final   Report Status PENDING  Incomplete    Studies/Results: No results found.   Assessment/Plan: Metastatic bladder CA (last CTX 9-14 [facility]) Fever Mucositis Cutaneous lupus Protein calorie maluntrition, severe  Total days of antibiotics: 4 vanco, 2 ceftaz  His fever appears to have improved from admission BCx are negative I am not sure how to further work this up, removal of port seems drastic with negative blood Cx.  His wife would like hospice eval as well as ENT for his sinus fullness.  I'll speak with Dr Burgess Estelle, who's excellent care I am grateful for.  Spent > 20 minutes updating wife          Bobby Rumpf Infectious Diseases (pager) [phone] [email or web address] 04/30/2015, 4:24 PM  LOS: 3 days

## 2015-04-30 NOTE — Progress Notes (Signed)
Physical Therapy Treatment Patient Details Name: James [name] MRN: [id number] DOB: June 28, 1936 Today's Date: 04/30/2015    History of Present Illness Patient is a 79 y/o male with hx of CAD s/p stenting, A-flutter s/p ablation, metastatic bladder carcinoma enrolled since July in Phase 1 trial at [facility] of FGFR inhibitor, OSA on Cpap, dyslipidemia admitted for fever and fatigue.    PT Comments    Patient progressing well towards PT goals. Improved ambulation distance today with long seated rest break. Continues to fatigue and has decreased energy/endurance. Increased time to perform all mobility. Lengthy discussion re: mobility at home, exercise, getting on routine, role of PT/HHPT and expectations. Will plan for stair training next session as tolerated. Will follow acutely.   Follow Up Recommendations  Home health PT;Supervision for mobility/OOB     Equipment Recommendations  None recommended by PT    Recommendations for Other Services       Precautions / Restrictions Precautions Precautions: Fall Restrictions Weight Bearing Restrictions: No    Mobility  Bed Mobility Overal bed mobility: Needs Assistance Bed Mobility: Supine to Sit;Sit to Supine     Supine to sit: Supervision;HOB elevated Sit to supine: Min assist;HOB elevated   General bed mobility comments: Use of rails for support. Min A to bring BLEs into bed.  Transfers Overall transfer level: Needs assistance Equipment used: Rolling walker (2 wheeled) Transfers: Sit to/from Stand Sit to Stand: Min guard         General transfer comment: Min guard for safety. Has to stand for a few mins due to hx of postural hypotension. Stood from [employer], from toilet x1.  Ambulation/Gait Ambulation/Gait assistance: Min guard Ambulation Distance (Feet): 150 Feet (x2 bouts) Assistive device: Rolling walker (2 wheeled) Gait Pattern/deviations: Step-through pattern;Decreased stride length   Gait velocity interpretation: <1.8  ft/sec, indicative of risk for recurrent falls General Gait Details: Slow, mildly unsteady gait. Sp02 87% on RA. 1 seated rest break due to fatigue.    Stairs            Wheelchair Mobility    Modified Rankin (Stroke Patients Only)       Balance Overall balance assessment: Needs assistance Sitting-balance support: Feet supported;No upper extremity supported Sitting balance-Leahy Scale: Fair     Standing balance support: During functional activity Standing balance-Leahy Scale: Fair Standing balance comment: Tolerated dynamic standing -washing hands without UE support.                    Cognition Arousal/Alertness: Awake/alert Behavior During Therapy: WFL for tasks assessed/performed Overall Cognitive Status: Within Functional Limits for tasks assessed                      Exercises      General Comments        Pertinent Vitals/Pain Pain Assessment: Faces Faces Pain Scale: Hurts a little bit Pain Location: lips, throat Pain Descriptors / Indicators: Sore Pain Intervention(s): Monitored during session;Repositioned    Home Living                      Prior Function            PT Goals (current goals can now be found in the care plan section) Progress towards PT goals: Progressing toward goals    Frequency  Min 4X/week    PT Plan Current plan remains appropriate    Co-evaluation  End of Session Equipment Utilized During Treatment: Gait belt Activity Tolerance: Patient limited by fatigue;Patient tolerated treatment well Patient left: in bed;with call bell/phone within reach;with family/visitor present     Time: 8887-5797 PT Time Calculation (min) (ACUTE ONLY): 45 min  Charges:  $Gait Training: 8-22 mins $Therapeutic Activity: 8-22 mins $Self Care/Home Management: 8-22                    G Codes:      Shauna A Hartshorne 04/30/2015, 3:33 PM Wray Kearns, [place], DPT [phone]

## 2015-04-30 NOTE — Care Management Important Message (Signed)
Important Message  Patient Details  Name: James [name] MRN: [id number] Date of Birth: [birth year]   Medicare Important Message Given:  Yes-second notification given    Delorse Lek 04/30/2015, 10:06 AM

## 2015-04-30 NOTE — Progress Notes (Signed)
PT Cancellation Note  Patient Details Name: James [name] MRN: [id number] DOB: [birth year]   Cancelled Treatment:    Reason Eval/Treat Not Completed: Patient declined, no reason specified Pt reports feeling nauseous with increased pain this AM. Will follow up in PM when pt feeling better. Wife present in room and agreeable.   [name] 04/30/2015, 9:49 AM Wray Kearns, PT, DPT [phone]

## 2015-05-01 DIAGNOSIS — Z515 Encounter for palliative care: Secondary | ICD-10-CM

## 2015-05-01 LAB — ANTINUCLEAR ANTIBODIES, IFA: ANA Ab, IFA: NEGATIVE

## 2015-05-01 MED ORDER — OXYCODONE HCL 5 MG PO TABS
5.0000 mg | ORAL_TABLET | ORAL | Status: DC | PRN
  Administered 2015-05-01 – 2015-05-04 (×7): 5 mg via ORAL
  Filled 2015-05-01 (×7): qty 1

## 2015-05-01 MED ORDER — DOXEPIN HCL 10 MG/ML PO CONC
25.0000 mg | Freq: Three times a day (TID) | ORAL | Status: DC
  Administered 2015-05-01 – 2015-05-03 (×6): 25 mg via ORAL
  Filled 2015-05-01 (×12): qty 2.5

## 2015-05-01 MED ORDER — ACETAMINOPHEN 325 MG PO TABS
650.0000 mg | ORAL_TABLET | Freq: Four times a day (QID) | ORAL | Status: DC
  Administered 2015-05-01 – 2015-05-04 (×6): 650 mg via ORAL
  Filled 2015-05-01 (×7): qty 2

## 2015-05-01 MED ORDER — FAMOTIDINE IN NACL 20-0.9 MG/50ML-% IV SOLN
20.0000 mg | Freq: Once | INTRAVENOUS | Status: AC
  Administered 2015-05-01: 20 mg via INTRAVENOUS
  Filled 2015-05-01: qty 50

## 2015-05-01 NOTE — Consult Note (Signed)
Consultation Note Date: 05/01/2015   Patient Name: James [name]  DOB: 05-13-1936  MRN: [id number]  Age / Sex: 79 y.o., male   PCP: Dione Plover, MD Referring Physician: Nita Sells, MD  Reason for Consultation: Establishing goals of care and Pain control  Palliative Care Assessment and Plan Summary of Established Goals of Care and Medical Treatment Preferences   Clinical Assessment/Narrative: I met with James [name] and his wife. He is a 79 year old male with metastatic bladder cancer. He is currently receiving therapy through a phase 1 trial at [facility]. He is currently admitted with fevers and infectious diseases managing his antibiotics. He had a poor reaction with vancomycin and wants to discuss possibility of changing this with them later today. Palliative was consulted for goals of care at family request.  James [name] reports the most important things to him are spending time with his wife, having as good a quality life as possible, and being out of the hospital as much as possible. He and his wife speak a lot about their relationship over the years and how much support they've received from different churches they have belonged to over the years.  He reports that his doctors have been doing a good job explaining things to him. He understands the as metastatic disease that is not going to be cured. He has been told that if he is no longer receiving therapy he will likely benefit from hospice services. His wife reports that he had been reluctant up to this point in time due to the fact that he wanted to continue to have CT scans done to check progress of his metastatic disease.  We discussed how he is been doing of the last several months and how this is probably a better indicator of how he is doing than continued scans. His wife endorses that he has had decrease in his cognition, functional status, and nutrition.  We discussed that the hospital can be useful as long as he is getting  well enough from care he receives at the hospital to enjoy his time at home. He readily endorses he is likely at the point where, if his goal is to be at home, a better plan may be to bring care to him at home rather repeated trips to the hospital. We discussed hospice as a tool that will be beneficial in this goal as he also endorses we are trying to fix problems that are not fixable.  Both he and his wife would like to work to see if we can get his mucositis under better control over the next day or so (while he is still in the hospital to receive antibiotics) and then evaluate if he would best be served to go home with hospice support.  - We'll make some changes to his current regimen to see if he gets better relief from his mucositis - Both he and his wife are open to continuing discussion about going home with home health versus hospice support. Based upon our initial conversation, he may be best served by going home with hospice. - Someone from our team will follow-up with him tomorrow to check his symptoms and continue conversation  Contacts/Participants in Discussion: Primary Decision Maker: The patient and his wife HCPOA: None on chart   Code Status/Advance Care Planning:  DO NOT RESUSCITATE  Symptom Management:   Mucositis: Appears to have grade 2-3 mucositis. He has some topical treatment on which limits seen the lesion somewhat, however I think it looks  more consistent with mucositis than thrush. He is currently on MS Contin twice a day as well as breakthrough oxycodone. He reports that this helps somewhat, but is still having significant pain. It appears to be more of a terminal dose failure than underdosing of medication he is receiving.  We'll plan for addition of scheduled Tylenol four times daily  We'll plan to liberalize his oxycodone usage to every 3 hours as he reports the issue is that he has pain return before he is due for his next dose. Based upon his usage over the next  24-48 hours,we will work to continue to titrate his long-acting medication as appropriate.  Although his mucositis is related to medication rather than radiation therapy, he may benefit from trial of doxepin oral rinse 4 times daily. I appreciate pharmacy's assistance in reviewing article I forwarded regarding this and setting up the order. Will plan for doxepin 25 mg in 5 mL of fluid. Swish in mouth for 1 minute and spit 4 times daily. If this is beneficial, it could be increased to as frequently as every 4 hours. Reviewed need to swish and spit rather than swallow medication with patient  We'll also continue his other topical treatments including Magic mouthwash without Benadryl (he reacts poorly to Benadryl).  Palliative Prophylaxis: Continue MiraLAX. Dulcolax as needed.  Psycho-social/Spiritual:   Support System: His wife  Desire for further Chaplaincy support:no  Prognosis: < 6 months  Discharge Planning:  To be determined. Based upon conversation today, James [name] plan is to transition home on discharge. He is not sure if this will be with home health or hospice support.       Chief Complaint/History of Present Illness:   79 year old male with metastatic bladder cancer who is currently enrolled in study at [facility] and was admitted to [facility] on 9/16 with fever tachycardia and tachypnea. He has had significant mucositis likely related to medications from drug trial.  Primary Diagnoses  Present on Admission:  . Atrial flutter . Sleep apnea . Fever . Cutaneous lupus erythematosus . Neoplastic malignant related fatigue . Sepsis . Hyponatremia . Normocytic anemia . Generalized weakness . Metastasis from bladder cancer . Cancer associated pain . Depression  Palliative Review of Systems: Patient reports being very sleepy. He also reports pain in his oropharynx. He has lower abdominal pain and back pain as well. Most predominant pain is in his mouth which he rates as 7 out of  10 I have reviewed the medical record, interviewed the patient and family, and examined the patient. The following aspects are pertinent.  Past Medical History  Diagnosis Date  . Atrial flutter 05/2009    STATUS POST CARDIOVERSION;  s/p RFCA in 2012 (Dr. Caryl Comes)  . Depression   . History of immunotherapy 2014    "had many times before and it worked; had a severe reaction Feb 18, 2013; they coded me, I had sepsis; almost died" (August 15, 2013)  . OSA on CPAP   . Pneumonia   . CAD (coronary artery disease)     a. NSTEMI 07/2013:  LHC (08/03/13): mLAD 95% assoc w/ thrombus, dLAD 50%, pRCA 80%, mRCA 50%, normal LVF. => PCI:  Rebel (4 x 12 mm) BMS to the mLAD and Rebel (4 x 15 mm) BMS to the pRCA.  . Osteoarthritis   . Dyslipidemia     Lipitor started at time of NSTEMI 07/2013  . Bladder cancer     'started chemo 07/2013 " (2013/08/15)   Social History   Social History  .  Marital Status: Married    Spouse Name: N/A  . Number of Children: N/A  . Years of Education: N/A   Social History Main Topics  . Smoking status: Former Smoker -- 1.50 packs/day for 15 years    Types: Cigarettes  . Smokeless tobacco: Never Used     Comment: 08/02/2013 "quit smoking in 1984"  . Alcohol Use: No     Comment: 10/13/2014 "hx of abuse; clean since 05/29/1981"  . Drug Use: No  . Sexual Activity: Yes   Other Topics Concern  . None   Social History Narrative    The patient is a retired Administrator.  He used to     smoke but quit in 1983.  He does not drink alcohol.    Family History  Problem Relation Age of Onset  . Heart failure Father   . Stroke Father   . Kidney failure Mother   . Coronary artery disease     Scheduled Meds: . acetaminophen  650 mg Oral 4 times per day  . acidophilus  1 capsule Oral Daily  . carvedilol  6.25 mg Oral BID WC  . cefTAZidime (FORTAZ)  IV  2 g Intravenous Q12H  . doxepin  25 mg Oral TID WC & HS  . enoxaparin (LOVENOX) injection  40 mg Subcutaneous Daily  . feeding  supplement  1 Container Oral BID BM  . feeding supplement (ENSURE ENLIVE)  237 mL Oral BID BM  . morphine  15 mg Oral BID  . mucosal barrier oral  1 packet Oral TID  . polyethylene glycol  17 g Oral Daily  . sertraline  150 mg Oral Daily  . venlafaxine XR  75 mg Oral Q breakfast   Continuous Infusions: . sodium chloride 75 mL/hr at 05/01/15 0130   PRN Meds:.antiseptic oral rinse, bisacodyl, oxyCODONE, prochlorperazine, sodium chloride Medications Prior to Admission:  Prior to Admission medications   Medication Sig Start Date End Date Taking? Authorizing Provider  acetaminophen (TYLENOL) 500 MG tablet Take 500 mg by mouth every 6 (six) hours as needed for fever.   Yes Historical Provider, MD  antiseptic oral rinse (BIOTENE) LIQD 15 mLs by Mouth Rinse route as needed for dry mouth.   Yes Historical Provider, MD  carvedilol (COREG) 6.25 MG tablet TAKE 1 TABLET (6.25 MG TOTAL) BY MOUTH 2 (TWO) TIMES DAILY. 10/16/14  Yes Thayer Headings, MD  Investigational - Study Medication Take 13.5 mg by mouth See admin instructions. Additional Study Details: [place] [id number] Delfin Edis INCB ([phone]) -eIRB # [phone] chemo tablet  14 days on, 7 days off. Take with water on an empty stomach.   Yes Historical Provider, MD  lidocaine-prilocaine (EMLA) cream Apply topically as needed for port   Yes Historical Provider, MD  loperamide (IMODIUM A-D) 2 MG tablet Take 2 mg by mouth 3 (three) times daily as needed for diarrhea or loose stools.   Yes Historical Provider, MD  magic mouthwash w/lidocaine SOLN Take 5 mLs by mouth 3 (three) times daily as needed for mouth pain. NO BENADRYL   Yes Historical Provider, MD  morphine (MS CONTIN) 15 MG 12 hr tablet Take 1 tablet (15 mg total) by mouth every 12 (twelve) hours. 10/20/14  Yes Simbiso Ranga, MD  MULTIPLE VITAMIN PO Take by mouth daily.   Yes Historical Provider, MD  NITROSTAT 0.4 MG SL tablet PLACE 1 TABLET UNDER THE TONGUE EVERY 5 MINUTES AS NEEDED FOR CHEST PAIN  04/05/15  Yes Thayer Headings, MD  Omega-3 Fatty Acids (OMEGA 3 PO) Take 1 capsule by mouth 2 (two) times daily. [phone] mg   Yes Historical Provider, MD  oxyCODONE (OXY IR/ROXICODONE) 5 MG immediate release tablet Take 5 mg by mouth 2 (two) times daily as needed for breakthrough pain.   Yes Historical Provider, MD  polyethylene glycol (MIRALAX / GLYCOLAX) packet Take 17 g by mouth daily.   Yes Historical Provider, MD  Probiotic Product (PROBIOTIC & ACIDOPHILUS EX ST PO) Take by mouth daily.   Yes Historical Provider, MD  prochlorperazine (COMPAZINE) 10 MG tablet Take 10 mg by mouth every 6 (six) hours as needed for nausea or vomiting.   Yes Historical Provider, MD  sertraline (ZOLOFT) 100 MG tablet Take 150 mg by mouth daily.    Yes Historical Provider, MD  sevelamer carbonate (RENVELA) 800 MG tablet Take 800 mg by mouth See admin instructions. Three times daily with meals, only on days with Study Drug   Yes Historical Provider, MD  venlafaxine XR (EFFEXOR-XR) 75 MG 24 hr capsule Take 75 mg by mouth daily with breakfast.   Yes Historical Provider, MD   Allergies  Allergen Reactions  . Benadryl [Diphenhydramine Hcl] Other (See Comments)    Pt becomes very disoriented, extremely tired, memory loss  . Morphine Nausea Only    OK if given slowly.  . Other Other (See Comments)    Patient states when he takes flomax and protonix together makes him break out in a rash  . Adhesive [Tape] Itching and Rash    Blisters, tears skin  . Bupropion Hives and Rash  . Flomax [Tamsulosin Hcl] Rash    When taken with protonix only patient can take this alone  . Pantoprazole Sodium Rash    When taken with flomax only patient can take this alone  . Tamsulosin Rash    When taken with protonix only patient can take this alone   CBC:    Component Value Date/Time   WBC 4.7 04/30/2015 0529   WBC 4.9 04/18/2015 1040   HGB 8.7* 04/30/2015 0529   HGB 9.5* 04/18/2015 1040   HCT 27.2* 04/30/2015 0529   HCT 28.8*  04/18/2015 1040   PLT 196 04/30/2015 0529   PLT 240 04/18/2015 1040   MCV 85.8 04/30/2015 0529   MCV 87.9 04/18/2015 1040   NEUTROABS 3.4 04/30/2015 0529   NEUTROABS 3.3 04/18/2015 1040   LYMPHSABS 0.6* 04/30/2015 0529   LYMPHSABS 0.6* 04/18/2015 1040   MONOABS 0.5 04/30/2015 0529   MONOABS 0.7 04/18/2015 1040   EOSABS 0.3 04/30/2015 0529   EOSABS 0.2 04/18/2015 1040   BASOSABS 0.0 04/30/2015 0529   BASOSABS 0.0 04/18/2015 1040   Comprehensive Metabolic Panel:    Component Value Date/Time   NA 128* 04/29/2015 0724   NA 132* 04/18/2015 1040   K 3.8 04/29/2015 0724   K 4.5 04/18/2015 1040   CL 99* 04/29/2015 0724   CO2 22 04/29/2015 0724   CO2 26 04/18/2015 1040   BUN 14 04/29/2015 0724   BUN 16.9 04/18/2015 1040   CREATININE 1.28* 04/29/2015 0724   CREATININE 1.3 04/18/2015 1040   GLUCOSE 109* 04/29/2015 0724   GLUCOSE 97 04/18/2015 1040   CALCIUM 8.1* 04/29/2015 0724   CALCIUM 9.1 04/18/2015 1040   AST 23 04/29/2015 0724   AST 24 04/18/2015 1040   ALT 12* 04/29/2015 0724   ALT 14 04/18/2015 1040   ALKPHOS 64 04/29/2015 0724   ALKPHOS 92 04/18/2015 1040   BILITOT 0.6 04/29/2015 0724  BILITOT 0.50 04/18/2015 1040   PROT 6.2* 04/29/2015 0724   PROT 7.1 04/18/2015 1040   ALBUMIN 2.5* 04/29/2015 0724   ALBUMIN 3.2* 04/18/2015 1040    Physical Exam: Vital Signs: BP 133/74 mmHg  Pulse 65  Temp(Src) 97.7 F (36.5 C) (Axillary)  Resp 16  Ht '5\' 8"'  (1.727 m)  Wt 100.699 kg (222 lb)  BMI 33.76 kg/m2  SpO2 96% SpO2: SpO2: 96 % O2 Device: O2 Device: Not Delivered O2 Flow Rate:   Intake/output summary:  Intake/Output Summary (Last 24 hours) at 05/01/15 1640 Last data filed at 05/01/15 1300  Gross per 24 hour  Intake   2070 ml  Output   1250 ml  Net    820 ml   LBM: Last BM Date: 04/30/15 Baseline Weight: Weight: 100.699 kg (222 lb) Most recent weight: Weight: 100.699 kg (222 lb)  Exam Findings:  General: Alert, sleepy, in no acute distress.  HEENT: No  bruits, no goiter, no JVD Mouth: He has area of erythema with mild ulceration posterior pharynx and back part of his palate.  Heart: Regular rate and rhythm. No murmur appreciated. Lungs: Good air movement, clear Abdomen: Soft, nontender, nondistended, positive bowel sounds.  Ext: No significant edema Skin: Warm and dry Neuro: Grossly intact, nonfocal.         Palliative Performance Scale: 40            Additional Data Reviewed: Recent Labs     04/29/15  0724  04/30/15  0529  WBC  5.0  4.7  HGB  8.5*  8.7*  PLT  183  196  NA  128*   --   BUN  14   --   CREATININE  1.28*   --      Time In: 1000 Time Out: 1115 Time Total: 75 Greater than 50%  of this time was spent counseling and coordinating care related to the above assessment and plan.  Signed by: Micheline Rough, MD  Micheline Rough, MD  05/01/2015, 4:40 PM  Please contact Palliative Medicine Team phone at [phone] for questions and concerns.

## 2015-05-01 NOTE — Progress Notes (Signed)
While Vancomycin dose was infusing last night, pt's wife became concerned about patient's increasing red rash,especially on the back of his neck.  She felt the rash was related to the Vancomycin.  I spoke with the pharmacist,Corey who recommended IV Pepcid and Tylenol..Pt's wife reports that patient cannot take Benadryl. Order obtained for IV Pepcid 20mg .  This was administered and this am patient's wife feels that the rash is slightly better.

## 2015-05-01 NOTE — Progress Notes (Signed)
Pt has home CPAP at bedside. No assistance needed from respiratory. RT will monitor.

## 2015-05-01 NOTE — Progress Notes (Signed)
PT Cancellation Note  Patient Details Name: James [name] MRN: [id number] DOB: 11/02/1935   Cancelled Treatment:    Reason Eval/Treat Not Completed: Fatigue/lethargy limiting ability to participate;Pain limiting ability to participate. Patients wife stated due to pain and fatigue that patient needed to sleep and try to attempt therapy later in the afternoons. Explained scheduling to patients wife and she understood that we will try our best to see him in the PM but scheduling times was very difficult. Will follow up later today as time allows. Patients wife stated that he did walk with her yesterday afternoon.    Jacqualyn Posey 05/01/2015, 11:43 AM

## 2015-05-01 NOTE — Progress Notes (Signed)
INFECTIOUS DISEASE PROGRESS NOTE  ID: James [name] is a 79 y.o. male with  Principal Problem:   Fever Active Problems:   Atrial flutter   Sleep apnea   Depression   Hyponatremia   Normocytic anemia   Metastasis from bladder cancer   Cutaneous lupus erythematosus   Cancer associated pain   Neoplastic malignant related fatigue   Sepsis   Generalized weakness   Anemia   Protein-calorie malnutrition, severe   Fever of unknown origin   Mucositis due to chemotherapy  Subjective: Events of last pm noted- concern for erythema while getting vanco.  Fatigued after getting narcotics. Awakens and follows commands.    Abtx:  Anti-infectives    Start     Dose/Rate Route Frequency Ordered Stop   04/28/15 2200  vancomycin (VANCOCIN) 1,250 mg in sodium chloride 0.9 % 250 mL IVPB     1,250 mg 166.7 mL/hr over 90 Minutes Intravenous Every 24 hours 04/28/15 1015     04/28/15 2200  cefTAZidime (FORTAZ) 2 g in dextrose 5 % 50 mL IVPB     2 g 100 mL/hr over 30 Minutes Intravenous Every 12 hours 04/28/15 1545     04/27/15 0700  piperacillin-tazobactam (ZOSYN) IVPB 3.375 g  Status:  Discontinued     3.375 g 12.5 mL/hr over 240 Minutes Intravenous Every 8 hours 04/27/15 0304 04/28/15 1531   04/27/15 0330  vancomycin (VANCOCIN) IVPB 750 mg/150 ml premix  Status:  Discontinued     750 mg 150 mL/hr over 60 Minutes Intravenous Every 12 hours 04/27/15 0304 04/28/15 1015   04/26/15 2330  piperacillin-tazobactam (ZOSYN) IVPB 3.375 g     3.375 g 100 mL/hr over 30 Minutes Intravenous  Once 04/26/15 2326 04/27/15 0123   04/26/15 2330  vancomycin (VANCOCIN) IVPB 1000 mg/200 mL premix     1,000 mg 200 mL/hr over 60 Minutes Intravenous  Once 04/26/15 2326 04/27/15 0225      Medications:  Scheduled: . acetaminophen  650 mg Oral 4 times per day  . acidophilus  1 capsule Oral Daily  . carvedilol  6.25 mg Oral BID WC  . cefTAZidime (FORTAZ)  IV  2 g Intravenous Q12H  . doxepin  25 mg Oral TID WC &  HS  . enoxaparin (LOVENOX) injection  40 mg Subcutaneous Daily  . feeding supplement  1 Container Oral BID BM  . feeding supplement (ENSURE ENLIVE)  237 mL Oral BID BM  . morphine  15 mg Oral BID  . mucosal barrier oral  1 packet Oral TID  . polyethylene glycol  17 g Oral Daily  . sertraline  150 mg Oral Daily  . vancomycin  1,250 mg Intravenous Q24H  . venlafaxine XR  75 mg Oral Q breakfast    Objective: Vital signs in last 24 hours: Temp:  [97.6 F (36.4 C)-98.7 F (37.1 C)] 97.7 F (36.5 C) (09/20 1416) Pulse Rate:  [64-73] 65 (09/20 1416) Resp:  [16-18] 16 (09/20 1416) BP: (121-133)/(51-74) 133/74 mmHg (09/20 1416) SpO2:  [95 %-98 %] 96 % (09/20 1416) FiO2 (%):  [0 %] 0 % (09/20 0428)   General appearance: fatigued Mouth: erythema. No thrush.  Chest wall- post is non-tender, no fluctuance.  Resp: rhonchi anterior - bilateral Cardio: regular rate and rhythm GI: normal findings: bowel sounds normal and soft, non-tender Skin: erythema of face and neck. mild erythema of arms.   Lab Results  Recent Labs  04/29/15 0724 04/30/15 0529  WBC 5.0 4.7  HGB 8.5*  8.7*  HCT 26.3* 27.2*  NA 128*  --   K 3.8  --   CL 99*  --   CO2 22  --   BUN 14  --   CREATININE 1.28*  --    Liver Panel  Recent Labs  04/29/15 0724  PROT 6.2*  ALBUMIN 2.5*  AST 23  ALT 12*  ALKPHOS 64  BILITOT 0.6   Sedimentation Rate No results for input(s): ESRSEDRATE in the last 72 hours. C-Reactive Protein No results for input(s): CRP in the last 72 hours.  Microbiology: Recent Results (from the past 240 hour(s))  Culture, blood (routine x 2)     Status: None (Preliminary result)   Collection Time: 04/26/15  9:27 PM  Result Value Ref Range Status   Specimen Description BLOOD LEFT ARM  Final   Special Requests BOTTLES DRAWN AEROBIC AND ANAEROBIC 10CC  Final   Culture NO GROWTH 4 DAYS  Final   Report Status PENDING  Incomplete  Culture, blood (routine x 2)     Status: None (Preliminary  result)   Collection Time: 04/26/15  9:33 PM  Result Value Ref Range Status   Specimen Description BLOOD LEFT HAND  Final   Special Requests BOTTLES DRAWN AEROBIC ONLY 10CC  Final   Culture NO GROWTH 4 DAYS  Final   Report Status PENDING  Incomplete  Urine culture     Status: None   Collection Time: 04/26/15 11:30 PM  Result Value Ref Range Status   Specimen Description URINE, CATHETERIZED  Final   Special Requests NONE  Final   Culture MULTIPLE SPECIES PRESENT, SUGGEST RECOLLECTION  Final   Report Status 04/29/2015 FINAL  Final  Culture, blood (routine x 2)     Status: None (Preliminary result)   Collection Time: 04/28/15  1:00 PM  Result Value Ref Range Status   Specimen Description BLOOD RIGHT ANTECUBITAL  Final   Special Requests BOTTLES DRAWN AEROBIC AND ANAEROBIC 10CC  Final   Culture NO GROWTH 3 DAYS  Final   Report Status PENDING  Incomplete  Culture, blood (routine x 2)     Status: None (Preliminary result)   Collection Time: 04/28/15  1:15 PM  Result Value Ref Range Status   Specimen Description BLOOD LEFT ANTECUBITAL  Final   Special Requests BOTTLES DRAWN AEROBIC AND ANAEROBIC 10CC  Final   Culture NO GROWTH 3 DAYS  Final   Report Status PENDING  Incomplete    Studies/Results: No results found.   Assessment/Plan: Metastatic bladder CA (last CTX 9-14 [facility]) Fever Mucositis Cutaneous lupus Protein calorie maluntrition, severe  Total days of antibiotics: 5 vanco, 3 ceftaz Will stop his vanco Reassess his rash tomorrow.  Family is assessing his status, possible palliative care.  Appreciate palliative care help with his mucositis.          James [name] Infectious Diseases (pager) [phone] [email or web address] 05/01/2015, 2:40 PM  LOS: 4 days

## 2015-05-01 NOTE — Progress Notes (Signed)
James [name]:[id number] DOB: Jun 14, 1936 DOA: 04/26/2015 PCP: Dione Plover, MD  Brief narrative: 79 y/o ? Metastatic bladder cancer diagnosed 1996 with superficial bladder cancer -Became metastatic 06/2013 See progress note 12/28/14  followed at [facility]-recently seen by his oncologist Dr. Siri Cole (on phase I study F GFR inhibitor for restaging as well as clinical trial medications) -He has been recommended hospice in the past if not wanting any specific therapy CAD status post stent 08/04/13 LAD and proximal RCA A flutter status post ablation 2012-not apparently on any anticoagulation currently OSA on CPAP Hyperlipidemia  Admitted 9/16 with temperature 101.6, tachycardia tachypnea and a 2 day history of feeling this way He enrolled at [facility] in phase I trial July 2016  Workup revealed no pneumonia and only nasal congestion  He has suffered from mucositis which has been intractable   Past medical history-As per Problem list Chart reviewed as below- nad  Consultants:  Palliative care  Infectious disease  Procedures:  None  Antibiotics:  Vancomycin, Zosyn 9/16  Narrowed to vancomycin and Ceftataz 9/17  Narrowed to - ceftaz and therapy 9/20   Subjective   Mild nasal congestion Obtain Gelclair for topical relief which seems to be helping Scheduled opiates has been thrown off a little bit No nausea no vomiting at present Appreciated of palliative care   Objective     Objective: Filed Vitals:   04/30/15 2340 05/01/15 0428 05/01/15 0852 05/01/15 1416  BP:  126/70 126/59 133/74  Pulse:  69 73 65  Temp: 98.3 F (36.8 C) 98.7 F (37.1 C)  97.7 F (36.5 C)  TempSrc: Axillary Axillary  Axillary  Resp:  16  16  Height:      Weight:      SpO2:  98%  96%    Intake/Output Summary (Last 24 hours) at 05/01/15 1552 Last data filed at 05/01/15 1300  Gross per 24 hour  Intake   2070 ml  Output   1250 ml  Net    820 ml     Exam:  General: EOMI NCAT-mouth has no specific lesions Cardiovascular: S1-S2 no murmur rub or gallop Respiratory: Clinically clear no added sound-port site appears clean without any swelling or redness Abdomen: Soft nontender nondistended Skin no lower extremity edema  Neuro intact  Data Reviewed: Basic Metabolic Panel:  Recent Labs Lab 04/26/15 2134 04/27/15 0605 04/28/15 0528 04/29/15 0724  NA 131* 128* 128* 128*  K 4.2 4.0 3.9 3.8  CL 99* 96* 98* 99*  CO2 _0 GLUCOSE 117* 122* 102* 109*  BUN _1 CREATININE 1.16 1.19 1.44* 1.28*  CALCIUM 9.0 8.3* 8.2* 8.1*   Liver Function Tests:  Recent Labs Lab 04/26/15 2134 04/29/15 0724  AST 24 23  ALT 13* 12*  ALKPHOS 96 64  BILITOT 0.6 0.6  PROT 7.7 6.2*  ALBUMIN 3.4* 2.5*   No results for input(s): LIPASE, AMYLASE in the last 168 hours. No results for input(s): AMMONIA in the last 168 hours. CBC:  Recent Labs Lab 04/26/15 2134 04/27/15 0605 04/28/15 0528 04/29/15 0724 04/30/15 0529  WBC 7.2 6.1 5.1 5.0 4.7  NEUTROABS 5.6  --   --  3.6 3.4  HGB 10.3* 9.4* 8.5* 8.5* 8.7*  HCT 32.6* 29.2* 26.9* 26.3* 27.2*  MCV 87.6 86.6 87.1 85.7 85.8  PLT 238 200 195 183 196   Cardiac Enzymes: No results for input(s): CKTOTAL, CKMB, CKMBINDEX, TROPONINI in the last 168 hours. BNP: Invalid  input(s): POCBNP CBG: No results for input(s): GLUCAP in the last 168 hours.  Recent Results (from the past 240 hour(s))  Culture, blood (routine x 2)     Status: None (Preliminary result)   Collection Time: 04/26/15  9:27 PM  Result Value Ref Range Status   Specimen Description BLOOD LEFT ARM  Final   Special Requests BOTTLES DRAWN AEROBIC AND ANAEROBIC 10CC  Final   Culture NO GROWTH 4 DAYS  Final   Report Status PENDING  Incomplete  Culture, blood (routine x 2)     Status: None (Preliminary result)   Collection Time: 04/26/15  9:33 PM  Result Value Ref Range Status   Specimen Description BLOOD LEFT HAND   Final   Special Requests BOTTLES DRAWN AEROBIC ONLY 10CC  Final   Culture NO GROWTH 4 DAYS  Final   Report Status PENDING  Incomplete  Urine culture     Status: None   Collection Time: 04/26/15 11:30 PM  Result Value Ref Range Status   Specimen Description URINE, CATHETERIZED  Final   Special Requests NONE  Final   Culture MULTIPLE SPECIES PRESENT, SUGGEST RECOLLECTION  Final   Report Status 04/29/2015 FINAL  Final  Culture, blood (routine x 2)     Status: None (Preliminary result)   Collection Time: 04/28/15  1:00 PM  Result Value Ref Range Status   Specimen Description BLOOD RIGHT ANTECUBITAL  Final   Special Requests BOTTLES DRAWN AEROBIC AND ANAEROBIC 10CC  Final   Culture NO GROWTH 3 DAYS  Final   Report Status PENDING  Incomplete  Culture, blood (routine x 2)     Status: None (Preliminary result)   Collection Time: 04/28/15  1:15 PM  Result Value Ref Range Status   Specimen Description BLOOD LEFT ANTECUBITAL  Final   Special Requests BOTTLES DRAWN AEROBIC AND ANAEROBIC 10CC  Final   Culture NO GROWTH 3 DAYS  Final   Report Status PENDING  Incomplete     Studies:              All Imaging reviewed and is as per above notation   Scheduled Meds: . acetaminophen  650 mg Oral 4 times per day  . acidophilus  1 capsule Oral Daily  . carvedilol  6.25 mg Oral BID WC  . cefTAZidime (FORTAZ)  IV  2 g Intravenous Q12H  . doxepin  25 mg Oral TID WC & HS  . enoxaparin (LOVENOX) injection  40 mg Subcutaneous Daily  . feeding supplement  1 Container Oral BID BM  . feeding supplement (ENSURE ENLIVE)  237 mL Oral BID BM  . morphine  15 mg Oral BID  . mucosal barrier oral  1 packet Oral TID  . polyethylene glycol  17 g Oral Daily  . sertraline  150 mg Oral Daily  . venlafaxine XR  75 mg Oral Q breakfast   Continuous Infusions: . sodium chloride 75 mL/hr at 05/01/15 0130     Assessment/Plan:    sepsis? Cause Unclear etiology -Index blood cultures 9/15 no growth -Urine culture  shows multiple columns therefore probably not cause -Blood cultures from 9/17 NG so far -Continue vancomycin/Zosyn started 9/16 was narrowed to ceftaz by ID given red man syndrome occurring 9/20 - ESR CRP are elevated but this is nonspecific we await, complement levels as well as ANA are negative so this is probably not lupus related -The patient's port does not seem infected grossly to my exam but that is the only other source  I can think of -4 the patient's mucositis patient can continue their own magic mouthwash without Benadryl if needed -Appreciate ID input -Appreciate palliative input -My sense is that the patient understands that palliative care is probably the main viable option at this stage given multiple complications with therapy inclusive of mucositis and other debilitating effects and he may be ready for home hospice once decisions have been made -His wife is very appreciative of our care and still has multiple remaining questions regarding hospice versus not and I will defer this to palliative care once they are able to see him tomorrow  Mucositis Already on his lidocaine I will add Gelclair to see if this helps appreciate palliative medicine input and doxepin use if possible by pharmacy able to obtain this as this was not available He has no fever -doubt any opportunistic EBV or other bug is playing a role His F GFR inhibitor causes mucositis as does renvela  Metastatic bladdercancer diagnosed 1996 now on experimental phase I therapy F GFR inhibitor -Follows with [facility] Siri [facility] at [facility] -Has an outpatient follow-up next Wednesday -His wife is indicated that they may discontinue the experimental phase I therapy and will let Dr[facility] 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

## 2015-05-02 DIAGNOSIS — G4730 Sleep apnea, unspecified: Secondary | ICD-10-CM

## 2015-05-02 DIAGNOSIS — G893 Neoplasm related pain (acute) (chronic): Secondary | ICD-10-CM

## 2015-05-02 DIAGNOSIS — Z515 Encounter for palliative care: Secondary | ICD-10-CM | POA: Insufficient documentation

## 2015-05-02 DIAGNOSIS — F329 Major depressive disorder, single episode, unspecified: Secondary | ICD-10-CM

## 2015-05-02 DIAGNOSIS — I4892 Unspecified atrial flutter: Secondary | ICD-10-CM

## 2015-05-02 DIAGNOSIS — R6510 Systemic inflammatory response syndrome (SIRS) of non-infectious origin without acute organ dysfunction: Secondary | ICD-10-CM

## 2015-05-02 DIAGNOSIS — R530 Neoplastic (malignant) related fatigue: Secondary | ICD-10-CM

## 2015-05-02 DIAGNOSIS — A419 Sepsis, unspecified organism: Secondary | ICD-10-CM

## 2015-05-02 DIAGNOSIS — E871 Hypo-osmolality and hyponatremia: Secondary | ICD-10-CM

## 2015-05-02 LAB — CULTURE, BLOOD (ROUTINE X 2)
CULTURE: NO GROWTH
Culture: NO GROWTH

## 2015-05-02 MED ORDER — BOOST / RESOURCE BREEZE PO LIQD
1.0000 | Freq: Three times a day (TID) | ORAL | Status: DC
  Administered 2015-05-02: 1 via ORAL

## 2015-05-02 MED ORDER — MORPHINE SULFATE ER 15 MG PO TBCR
15.0000 mg | EXTENDED_RELEASE_TABLET | Freq: Two times a day (BID) | ORAL | Status: DC
  Administered 2015-05-02 – 2015-05-03 (×2): 15 mg via ORAL
  Filled 2015-05-02 (×2): qty 1

## 2015-05-02 MED ORDER — SODIUM CHLORIDE 0.9 % IV SOLN
INTRAVENOUS | Status: DC
  Administered 2015-05-02 – 2015-05-04 (×2): via INTRAVENOUS

## 2015-05-02 MED ORDER — POLYETHYLENE GLYCOL 3350 17 G PO PACK: 17.0000 g | PACK | Freq: Every day | ORAL | Status: DC | PRN

## 2015-05-02 MED ORDER — POLYETHYLENE GLYCOL 3350 17 G PO PACK
8.5000 g | PACK | Freq: Every day | ORAL | Status: DC | PRN
  Filled 2015-05-02: qty 1

## 2015-05-02 NOTE — Progress Notes (Signed)
Daily Progress Note   Patient Name: James [name]       Date: 05/02/2015 DOB: November 23, 1935  Age: 79 y.o. MRN#: [id number] Attending Physician: Debbe Odea, MD Primary Care Physician: Dione Plover, MD Admit Date: 04/26/2015  Reason for Consultation/Follow-up: Establishing goals of care  Subjective:  patient is resting in bed, he feels some what better from his mucositis, pain is some what better controlled. His sister is at the bedside.   Interval Events: Conference call with his wife, with the patient and patient's sister present in the room Patient and family to meet with each other later today Likely hospice liaison to meet with family tomorrow.  Patient already connected with palliative services through [facility]  Length of Stay: 5 days  Current Medications: Scheduled Meds:  . acetaminophen  650 mg Oral 4 times per day  . acidophilus  1 capsule Oral Daily  . carvedilol  6.25 mg Oral BID WC  . cefTAZidime (FORTAZ)  IV  2 g Intravenous Q12H  . doxepin  25 mg Oral TID WC & HS  . enoxaparin (LOVENOX) injection  40 mg Subcutaneous Daily  . feeding supplement  1 Container Oral TID WC  . morphine  15 mg Oral BID  . mucosal barrier oral  1 packet Oral TID  . sertraline  150 mg Oral Daily  . venlafaxine XR  75 mg Oral Q breakfast    Continuous Infusions: . sodium chloride 75 mL/hr at 05/01/15 0130    PRN Meds: antiseptic oral rinse, bisacodyl, oxyCODONE, polyethylene glycol, prochlorperazine, sodium chloride  Palliative Performance Scale: 40%     Vital Signs: BP 151/69 mmHg  Pulse 67  Temp(Src) 98.1 F (36.7 C) (Axillary)  Resp 18  Ht 5\' 8"  (1.727 m)  Wt 100.699 kg (222 lb)  BMI 33.76 kg/m2  SpO2 98% SpO2: SpO2: 98 % O2 Device: O2 Device: CPAP O2 Flow Rate:    Intake/output summary:  Intake/Output Summary (Last 24 hours) at 05/02/15 1349 Last data filed at 05/02/15 0657  Gross per 24 hour  Intake    525 ml  Output   1700 ml  Net  -1175 ml   LBM:   9/21 Baseline Weight: Weight: 100.699 kg (222 lb) Most recent weight: Weight: 100.699 kg (222 lb)  Physical Exam: NAD patchy erythematous rashes generalized Mucositis, ulcer on lower lip Clear Abdomen soft S1S2 No edema Has foley catheter Awake alert             Additional Data Reviewed: Recent Labs     04/30/15  0529  WBC  4.7  HGB  8.7*  PLT  196     Problem List:  Patient Active Problem List   Diagnosis Date Noted  . Fever of unknown origin   . Mucositis due to chemotherapy   . Anemia 04/28/2015  . Protein-calorie malnutrition, severe 04/28/2015  . Sepsis 04/27/2015  . Generalized weakness   . Cancer associated pain 12/21/2014  . Neoplastic malignant related fatigue 12/21/2014  . Psychosocial stressors 12/21/2014  . Leg pain 10/19/2014  . Palliative care encounter 10/18/2014  . DNR (do not resuscitate) discussion 10/18/2014  . Pain, generalized 10/18/2014  . Leukopenia   . Thrombocytopenia 08/29/2014  . Normocytic anemia 08/29/2014  . Obstructive sleep apnea 08/29/2014  . Metastasis from bladder cancer 08/29/2014  . Drug-induced skin rash 08/29/2014  . Cutaneous lupus erythematosus 08/29/2014  . Steroid-induced hyperglycemia 08/29/2014  . Acute encephalopathy 08/28/2014  . Coronary atherosclerosis of native coronary artery 08/16/2013  .  Dyslipidemia 08/16/2013  . Chest pain 08/02/2013  . Hyponatremia 07/17/2013  . Atelectasis 07/17/2013  . Dyspnea 02/28/2011  . Atrial flutter   . Bladder cancer metastasized to liver   . Sleep apnea   . Depression      Palliative Care Assessment & Plan    Code Status:  DNR  Goals of Care:   continue current treatment  MS contin changed to 8:30 AM and 8:30 PM  Desire for further Chaplaincy support: no  3. Symptom Management:  Regimen noted, discussed in detail with patient  4. Palliative Prophylaxis:  Stool Softener: yes  5. Prognosis: < 2 weeks  5. Discharge Planning: likely Home with  Hospice   Care plan was discussed with  Patient wife sister   Thank you for allowing the Palliative Medicine Team to assist in the care of this patient.   Time In: 1100 Time Out: 1135 Total Time 35 Prolonged Time Billed  no     Greater than 50%  of this time was spent counseling and coordinating care related to the above assessment and plan.  Zeba Burt Knack, MD  05/02/2015, 1:49 PM  Please contact Palliative Medicine Team phone at [phone] for questions and concerns.

## 2015-05-02 NOTE — Progress Notes (Signed)
Physical Therapy Treatment Patient Details Name: James [name] MRN: [id number] DOB: 11-Mar-1936 Today's Date: 05/02/2015    History of Present Illness Patient is a 79 y/o male with hx of CAD s/p stenting, A-flutter s/p ablation, metastatic bladder carcinoma enrolled since July in Phase 1 trial at [facility] of FGFR inhibitor, OSA on Cpap, dyslipidemia admitted for fever and fatigue.    PT Comments    Pt alert/oriented, noting some fatigue but willing to participate with PT. He was able to perform bed mobility with supervision though noting he was lightheaded upon sitting (sitting BP 100/56).  Lightheadedness resolved and pt was able to stand at [facility] with Min guard for safety, again noting mild lightheadedness (standing BP dropped to 83/46). Due to BP changes, ambulation was deferred and nurse was notified.  Pt was able to perform seated exercises at EOB without symptoms.  Pt requires further PT services to ensure safe functional mobility.  Continue PT POC and progress as tolerated.     Follow Up Recommendations  Home health PT;Supervision for mobility/OOB     Equipment Recommendations  None recommended by PT    Recommendations for Other Services       Precautions / Restrictions Precautions Precautions: Fall Restrictions Weight Bearing Restrictions: No    Mobility  Bed Mobility Overal bed mobility: Needs Assistance Bed Mobility: Supine to Sit;Sit to Supine     Supine to sit: Supervision;HOB elevated (pt noting lightheadedness; BP sitting 100/56) Sit to supine: Supervision   General bed mobility comments: use of bed rails and verbal cueing for technique  Transfers Overall transfer level: Needs assistance Equipment used: Rolling walker (2 wheeled) Transfers: Sit to/from Stand Sit to Stand: Min guard         General transfer comment: Min guard for safety. pt with lightheadedness, reporting orthostatic hypotension is common for him.  BP 83/46 with standing.  Ambulation/Gait             General Gait Details: gait deferred due to hypotension   Stairs            Wheelchair Mobility    Modified Rankin (Stroke Patients Only)       Balance Overall balance assessment: Needs assistance Sitting-balance support: No upper extremity supported Sitting balance-Leahy Scale: Fair     Standing balance support: Bilateral upper extremity supported Standing balance-Leahy Scale: Fair Standing balance comment: pt with forward flexion and heavy reliance on RW; with cueing able to acheive upright posture but notes it takes him extra time because he gets lightheaded.                    Cognition Arousal/Alertness: Awake/alert Behavior During Therapy: Anxious Overall Cognitive Status: Within Functional Limits for tasks assessed                      Exercises General Exercises - Lower Extremity Ankle Circles/Pumps: 10 reps;Seated (circles each direction) Long Arc Quad: 10 reps;Seated Hip Flexion/Marching: 10 reps;Seated    General Comments        Pertinent Vitals/Pain Pain Assessment: Faces Faces Pain Scale: Hurts little more Pain Location: scrotum Pain Descriptors / Indicators: Grimacing;Guarding Pain Intervention(s): Monitored during session    Home Living                      Prior Function            PT Goals (current goals can now be found in the care plan section) Acute  Rehab PT Goals Patient Stated Goal: to feel better PT Goal Formulation: With patient Time For Goal Achievement: 05/11/15 Potential to Achieve Goals: Fair Progress towards PT goals: PT to reassess next treatment (pt limited by hypotension today, reassess progress as able)    Frequency  Min 4X/week    PT Plan Current plan remains appropriate    Co-evaluation             End of Session Equipment Utilized During Treatment: Gait belt Activity Tolerance: Treatment limited secondary to medical complications (Comment) (hypotension) Patient left:  in bed;with call bell/phone within reach;with family/visitor present     Time: 2947-6546 PT Time Calculation (min) (ACUTE ONLY): 35 min  Charges:  $Therapeutic Exercise: 8-22 mins $Therapeutic Activity: 8-22 mins                    G Codes:      James [name] 05-04-2015, 5:03 PM   Lorita Officer, SPT

## 2015-05-02 NOTE — Progress Notes (Signed)
[facility] Progress Note   James [name]  DJM:[id number]  DOB: January 17, 1936  DOA: 04/26/2015 PCP: Dione Plover, MD  Brief narrative: James [name] is a 79 y.o. male with bladder cancer with metastasis on experimental chemotherapy, a flutter, sleep apnea, cutaneous lupus, coronary artery disease, obstructive sleep apnea on C Pap admitted for fevers. Also found to have mucositis   Subjective: Pain in mouth and throat with swallowing- bloody yellow-green drainage from nose. No nausea vomiting or diarrhea. Poor appetite. No cough or shortness of breath.  Assessment/Plan: Principal Problem:   Fever 101.6, tachycardia, tachypnea-SIRS - Etiology unclear-possibly acute sinusitis -No obvious infection around Port-A-Cath-blood cultures negative -ID has been assisting with management -Patient was initially started on vancomycin and Zosyn changed to vancomycin and ceftazidime on 9/20 -Vancomycin discontinued on 9/20  -Further antibiotic management per ID  "red man" syndrome -In relation to vancomycin which was discontinued  Severe mucositis/   Cancer associated pain -Leukocytosis likely In relation to chemotherapy -Continue lidocaine -Also on MS Contin and oxycodone to help alleviate pain -Tolerating a soft diet  Acute renal insufficiency -Prerenal?-Continue to follow  Metastatic bladder cancer -Patient and wife uncertain if they want to continue chemotherapy and are speaking with hospice currently    Atrial flutter -Status post ablation in 2012 and not on anticoagulation -Currently in sinus rhythm -Continue Coreg    Sleep apnea -Continue C Pap    Depression -Continue current treatment    Hyponatremia -Possibly in relation to poor by mouth intake -Continue slow IV fluids    Normocytic anemia -Clear anemia of chronic disease  Code Status:     Code Status Orders        Start     Ordered   04/27/15 0258  Do not attempt resuscitation (DNR)   Continuous     Question Answer Comment  In the event of cardiac or respiratory ARREST Do not call a "code blue"   In the event of cardiac or respiratory ARREST Do not perform Intubation, CPR, defibrillation or ACLS   In the event of cardiac or respiratory ARREST Use medication by any route, position, wound care, and other measures to relive pain and suffering. May use oxygen, suction and manual treatment of airway obstruction as needed for comfort.      04/27/15 0257    Advance Directive Documentation        Most Recent Value   Type of Advance Directive  Out of facility DNR (pink MOST or yellow form)   Pre-existing out of facility DNR order (yellow form or pink MOST form)  Physician notified to receive inpatient order   "MOST" Form in Place?       Family Communication: Wife at bedside Disposition Plan: Home with home health or hospice DVT prophylaxis: Lovenox Consultants: Palliative care, infectious disease  Antibiotics: Anti-infectives    Start     Dose/Rate Route Frequency Ordered Stop   04/28/15 2200  vancomycin (VANCOCIN) 1,250 mg in sodium chloride 0.9 % 250 mL IVPB  Status:  Discontinued     1,250 mg 166.7 mL/hr over 90 Minutes Intravenous Every 24 hours 04/28/15 1015 05/01/15 1459   04/28/15 2200  cefTAZidime (FORTAZ) 2 g in dextrose 5 % 50 mL IVPB     2 g 100 mL/hr over 30 Minutes Intravenous Every 12 hours 04/28/15 1545     04/27/15 0700  piperacillin-tazobactam (ZOSYN) IVPB 3.375 g  Status:  Discontinued     3.375 g 12.5 mL/hr over 240 Minutes Intravenous  Every 8 hours 04/27/15 0304 04/28/15 1531   04/27/15 0330  vancomycin (VANCOCIN) IVPB 750 mg/150 ml premix  Status:  Discontinued     750 mg 150 mL/hr over 60 Minutes Intravenous Every 12 hours 04/27/15 0304 04/28/15 1015   04/26/15 2330  piperacillin-tazobactam (ZOSYN) IVPB 3.375 g     3.375 g 100 mL/hr over 30 Minutes Intravenous  Once 04/26/15 2326 04/27/15 0123   04/26/15 2330  vancomycin (VANCOCIN) IVPB 1000 mg/200 mL premix      1,000 mg 200 mL/hr over 60 Minutes Intravenous  Once 04/26/15 2326 04/27/15 0225      Objective: Filed Weights   04/26/15 2043  Weight: 100.699 kg (222 lb)    Intake/Output Summary (Last 24 hours) at 05/02/15 1506 Last data filed at 05/02/15 1300  Gross per 24 hour  Intake   1005 ml  Output   2350 ml  Net  -1345 ml     Vitals Filed Vitals:   05/01/15 1838 05/01/15 2134 05/02/15 0633 05/02/15 1454  BP: 131/94 110/68 151/69 104/67  Pulse: 66 58 67 76  Temp:  98.5 F (36.9 C) 98.1 F (36.7 C) 98.3 F (36.8 C)  TempSrc:  Oral Axillary Axillary  Resp:  16 18 18   Height:      Weight:      SpO2:  96% 98% 94%    Exam:  General:  Pt is alert, not in acute distress  Skin: erythema on face and maculopapular rash on arms and legs  HEENT: No icterus, No thrush, oral mucosa moist  Cardiovascular: regular rate and rhythm, S1/S2 No murmur  Respiratory: clear to auscultation bilaterally   Abdomen: Soft, +Bowel sounds, non tender, non distended, no guarding  MSK: No LE edema, cyanosis or clubbing  Data Reviewed: Basic Metabolic Panel:  Recent Labs Lab 04/26/15 2134 04/27/15 0605 04/28/15 0528 04/29/15 0724  NA 131* 128* 128* 128*  K 4.2 4.0 3.9 3.8  CL 99* 96* 98* 99*  CO2 24 23 23 22   GLUCOSE 117* 122* 102* 109*  BUN 15 16 18 14   CREATININE 1.16 1.19 1.44* 1.28*  CALCIUM 9.0 8.3* 8.2* 8.1*   Liver Function Tests:  Recent Labs Lab 04/26/15 2134 04/29/15 0724  AST 24 23  ALT 13* 12*  ALKPHOS 96 64  BILITOT 0.6 0.6  PROT 7.7 6.2*  ALBUMIN 3.4* 2.5*   No results for input(s): LIPASE, AMYLASE in the last 168 hours. No results for input(s): AMMONIA in the last 168 hours. CBC:  Recent Labs Lab 04/26/15 2134 04/27/15 0605 04/28/15 0528 04/29/15 0724 04/30/15 0529  WBC 7.2 6.1 5.1 5.0 4.7  NEUTROABS 5.6  --   --  3.6 3.4  HGB 10.3* 9.4* 8.5* 8.5* 8.7*  HCT 32.6* 29.2* 26.9* 26.3* 27.2*  MCV 87.6 86.6 87.1 85.7 85.8  PLT 238 200 195 183 196    Cardiac Enzymes: No results for input(s): CKTOTAL, CKMB, CKMBINDEX, TROPONINI in the last 168 hours. BNP (last 3 results)  Recent Labs  08/29/14 0135  BNP 117.9*    ProBNP (last 3 results) No results for input(s): PROBNP in the last 8760 hours.  CBG: No results for input(s): GLUCAP in the last 168 hours.  Recent Results (from the past 240 hour(s))  Culture, blood (routine x 2)     Status: None   Collection Time: 04/26/15  9:27 PM  Result Value Ref Range Status   Specimen Description BLOOD LEFT ARM  Final   Special Requests BOTTLES DRAWN AEROBIC AND  ANAEROBIC 10CC  Final   Culture NO GROWTH 5 DAYS  Final   Report Status 05/02/2015 FINAL  Final  Culture, blood (routine x 2)     Status: None   Collection Time: 04/26/15  9:33 PM  Result Value Ref Range Status   Specimen Description BLOOD LEFT HAND  Final   Special Requests BOTTLES DRAWN AEROBIC ONLY 10CC  Final   Culture NO GROWTH 5 DAYS  Final   Report Status 05/02/2015 FINAL  Final  Urine culture     Status: None   Collection Time: 04/26/15 11:30 PM  Result Value Ref Range Status   Specimen Description URINE, CATHETERIZED  Final   Special Requests NONE  Final   Culture MULTIPLE SPECIES PRESENT, SUGGEST RECOLLECTION  Final   Report Status 04/29/2015 FINAL  Final  Culture, blood (routine x 2)     Status: None (Preliminary result)   Collection Time: 04/28/15  1:00 PM  Result Value Ref Range Status   Specimen Description BLOOD RIGHT ANTECUBITAL  Final   Special Requests BOTTLES DRAWN AEROBIC AND ANAEROBIC 10CC  Final   Culture NO GROWTH 4 DAYS  Final   Report Status PENDING  Incomplete  Culture, blood (routine x 2)     Status: None (Preliminary result)   Collection Time: 04/28/15  1:15 PM  Result Value Ref Range Status   Specimen Description BLOOD LEFT ANTECUBITAL  Final   Special Requests BOTTLES DRAWN AEROBIC AND ANAEROBIC 10CC  Final   Culture NO GROWTH 4 DAYS  Final   Report Status PENDING  Incomplete      Studies: No results found.  Scheduled Meds:  Scheduled Meds: . acetaminophen  650 mg Oral 4 times per day  . acidophilus  1 capsule Oral Daily  . carvedilol  6.25 mg Oral BID WC  . cefTAZidime (FORTAZ)  IV  2 g Intravenous Q12H  . doxepin  25 mg Oral TID WC & HS  . enoxaparin (LOVENOX) injection  40 mg Subcutaneous Daily  . feeding supplement  1 Container Oral TID WC  . morphine  15 mg Oral BID  . mucosal barrier oral  1 packet Oral TID  . sertraline  150 mg Oral Daily  . venlafaxine XR  75 mg Oral Q breakfast   Continuous Infusions: . sodium chloride      Time spent on care of this patient: 35 min   [place], MD 05/02/2015, 3:06 PM  LOS: 5 days   [facility] Office  [phone] Pager - Text Page per [email or web address] If 7PM-7AM, please contact night-coverage [email or web address]

## 2015-05-03 ENCOUNTER — Telehealth: Payer: Self-pay | Admitting: *Deleted

## 2015-05-03 DIAGNOSIS — N179 Acute kidney failure, unspecified: Secondary | ICD-10-CM

## 2015-05-03 DIAGNOSIS — R502 Drug induced fever: Principal | ICD-10-CM

## 2015-05-03 DIAGNOSIS — L932 Other local lupus erythematosus: Secondary | ICD-10-CM

## 2015-05-03 DIAGNOSIS — T451X5A Adverse effect of antineoplastic and immunosuppressive drugs, initial encounter: Secondary | ICD-10-CM

## 2015-05-03 LAB — CULTURE, BLOOD (ROUTINE X 2)
CULTURE: NO GROWTH
Culture: NO GROWTH

## 2015-05-03 LAB — BASIC METABOLIC PANEL
ANION GAP: 5 (ref 5–15)
BUN: 10 mg/dL (ref 6–20)
CO2: 23 mmol/L (ref 22–32)
Calcium: 8.2 mg/dL — ABNORMAL LOW (ref 8.9–10.3)
Chloride: 105 mmol/L (ref 101–111)
Creatinine, Ser: 1.04 mg/dL (ref 0.61–1.24)
GFR calc Af Amer: >60 mL/min (ref >60)
GLUCOSE: 90 mg/dL (ref 65–99)
POTASSIUM: 3.8 mmol/L (ref 3.5–5.1)
Sodium: 133 mmol/L — ABNORMAL LOW (ref 135–145)

## 2015-05-03 MED ORDER — MORPHINE SULFATE ER 15 MG PO TBCR
15.0000 mg | EXTENDED_RELEASE_TABLET | Freq: Two times a day (BID) | ORAL | Status: DC
  Administered 2015-05-03: 15 mg via ORAL
  Filled 2015-05-03: qty 1

## 2015-05-03 MED ORDER — BISACODYL 10 MG RE SUPP
10.0000 mg | Freq: Every day | RECTAL | Status: DC
Start: 2015-05-03 — End: 2015-05-04
  Administered 2015-05-04: 10 mg via RECTAL
  Filled 2015-05-03 (×2): qty 1

## 2015-05-03 MED ORDER — MORPHINE SULFATE ER 15 MG PO TBCR
15.0000 mg | EXTENDED_RELEASE_TABLET | ORAL | Status: DC
  Administered 2015-05-04: 15 mg via ORAL
  Filled 2015-05-03: qty 1

## 2015-05-03 MED ORDER — MORPHINE SULFATE ER 15 MG PO TBCR
15.0000 mg | EXTENDED_RELEASE_TABLET | ORAL | Status: AC
  Administered 2015-05-03: 15 mg via ORAL
  Filled 2015-05-03: qty 1

## 2015-05-03 MED ORDER — POLYETHYLENE GLYCOL 3350 17 G PO PACK: 8.5000 g | PACK | Freq: Two times a day (BID) | ORAL | Status: DC | PRN

## 2015-05-03 MED ORDER — DOXEPIN HCL 10 MG/ML PO CONC: 25.0000 mg | Freq: Three times a day (TID) | ORAL | Status: DC | PRN

## 2015-05-03 MED ORDER — DOXEPIN HCL 10 MG/ML PO CONC
25.0000 mg | Freq: Three times a day (TID) | ORAL | Status: DC | PRN
  Filled 2015-05-03 (×2): qty 2.5

## 2015-05-03 MED ORDER — BISACODYL 10 MG RE SUPP: 10.0000 mg | Freq: Every day | RECTAL | Status: AC | PRN

## 2015-05-03 MED ORDER — MORPHINE SULFATE ER 30 MG PO TBCR: 30.0000 mg | EXTENDED_RELEASE_TABLET | ORAL | Status: DC

## 2015-05-03 NOTE — Progress Notes (Signed)
Daily Progress Note   Patient Name: James [name]       Date: 05/03/2015 DOB: [birth year]  Age: 79 y.o. MRN#: [id number] Attending Physician: Debbe Odea, MD Primary Care Physician: Dione Plover, MD Admit Date: 04/26/2015  Reason for Consultation/Follow-up: Establishing goals of care  Subjective:  patient is resting in bed, not as awake alert today  Interval Events:  hospice liaison to meet with family today Home with hospice soon Detailed discussion with wife and sister about pain medications and hospice enrollment   Length of Stay: 6 days  Current Medications: Scheduled Meds:  . acetaminophen  650 mg Oral 4 times per day  . acidophilus  1 capsule Oral Daily  . bisacodyl  10 mg Rectal Daily  . carvedilol  6.25 mg Oral BID WC  . cefTAZidime (FORTAZ)  IV  2 g Intravenous Q12H  . enoxaparin (LOVENOX) injection  40 mg Subcutaneous Daily  . feeding supplement  1 Container Oral TID WC  . morphine  15 mg Oral BID  . mucosal barrier oral  1 packet Oral TID  . sertraline  150 mg Oral Daily  . venlafaxine XR  75 mg Oral Q breakfast    Continuous Infusions: . sodium chloride 75 mL/hr at 05/02/15 1827    PRN Meds: antiseptic oral rinse, bisacodyl, doxepin, oxyCODONE, polyethylene glycol, prochlorperazine, sodium chloride  Palliative Performance Scale: 40%     Vital Signs: BP 117/69 mmHg  Pulse 69  Temp(Src) 98.6 F (37 C) (Oral)  Resp 16  Ht 5\' 8"  (1.727 m)  Wt 100.699 kg (222 lb)  BMI 33.76 kg/m2  SpO2 94% SpO2: SpO2: 94 % O2 Device: O2 Device: Not Delivered O2 Flow Rate:    Intake/output summary:   Intake/Output Summary (Last 24 hours) at 05/03/15 1444 Last data filed at 05/03/15 2025  Gross per 24 hour  Intake    236 ml  Output   1800 ml  Net  -1564 ml   LBM:  9/21 Baseline Weight: Weight: 100.699 kg (222 lb) Most recent weight: Weight: 100.699 kg (222 lb)  Physical Exam: NAD patchy erythematous rashes generalized Mucositis, ulcer on lower  lip Clear Abdomen soft S1S2 No edema Has foley catheter Awake alert             Additional Data Reviewed: Recent Labs     05/03/15  0455  NA  133*  BUN  10  CREATININE  1.04     Problem List:  Patient Active Problem List   Diagnosis Date Noted  . Encounter for palliative care   . SIRS (systemic inflammatory response syndrome)   . Fever of unknown origin   . Mucositis due to chemotherapy   . Anemia 04/28/2015  . Protein-calorie malnutrition, severe 04/28/2015  . Sepsis 04/27/2015  . Generalized weakness   . Cancer associated pain 12/21/2014  . Neoplastic malignant related fatigue 12/21/2014  . Psychosocial stressors 12/21/2014  . Leg pain 10/19/2014  . Palliative care encounter 10/18/2014  . DNR (do not resuscitate) discussion 10/18/2014  . Pain, generalized 10/18/2014  . Leukopenia   . Thrombocytopenia 08/29/2014  . Normocytic anemia 08/29/2014  . Obstructive sleep apnea 08/29/2014  . Metastasis from bladder cancer 08/29/2014  . Drug-induced skin rash 08/29/2014  . Cutaneous lupus erythematosus 08/29/2014  . Steroid-induced hyperglycemia 08/29/2014  . Acute encephalopathy 08/28/2014  . Coronary atherosclerosis of native coronary artery 08/16/2013  . Dyslipidemia 08/16/2013  . Chest pain 08/02/2013  . Hyponatremia 07/17/2013  . Atelectasis 07/17/2013  .  Dyspnea 02/28/2011  . Atrial flutter   . Bladder cancer metastasized to liver   . Sleep apnea   . Depression      Palliative Care Assessment & Plan    Code Status:  DNR  Goals of Care:   continue current treatment  MS contin changed to 8:30 AM and 8:30 PM, HS dose increased to 30 mg   Desire for further Chaplaincy support: no  3. Symptom Management:  Regimen noted, discussed in detail with patient  4. Palliative Prophylaxis:  Stool Softener: yes  5. Prognosis: < 2 weeks  5. Discharge Planning: likely Home with Hospice   Care plan was discussed with  Patient wife sister   Thank you  for allowing the Palliative Medicine Team to assist in the care of this patient.   Time In: 1400 Time Out: 1435 Total Time 35 Prolonged Time Billed  no     Greater than 50%  of this time was spent counseling and coordinating care related to the above assessment and plan.  Zeba Burt Knack, MD  05/03/2015, 2:44 PM  Please contact Palliative Medicine Team phone at [phone] for questions and concerns.

## 2015-05-03 NOTE — Progress Notes (Signed)
RT note:In to see pt., on home CPAP in no distress, multiple family members in room, RT to monitor.

## 2015-05-03 NOTE — Telephone Encounter (Signed)
Yes, he will be attending and yes to symptom management.

## 2015-05-03 NOTE — Clinical Documentation Improvement (Signed)
Internal Medicine/Oncology  After study, please clarify the likely etiology of the patient's fever:   Sepsis as documented  Increased systemic tumor burden secondary to F-GFR Inhibitor medications causing fever  Other  Clinically Undetermined  Supporting Information:  Please exercise your independent, professional judgment when responding. A specific answer is not anticipated or expected.  Thank You, Zoila Shutter BSN, [place] [phone]

## 2015-05-03 NOTE — Telephone Encounter (Signed)
WOULD DR.GUDENA BE THE ATTENDING PHYSICIAN? WOULD DR.GUDENA LIKE HOSPICE TO DO SYMPTOM MANAGEMENT.

## 2015-05-03 NOTE — Discharge Summary (Addendum)
Physician Discharge Summary  [name] CBS:[id number] DOB: 12-Jan-1936 DOA: 04/26/2015  PCP: Dione Plover, MD  Admit date: 04/26/2015 Discharge date: 05/04/2015  Time spent: 60 minutes  Recommendations for Outpatient Follow-up:  1. Hospice at home- management per Dr Lindi Adie  Discharge Condition: stable Diet recommendation: diet as tolerated  Discharge Diagnoses:  Principal Problem:   SIRS (systemic inflammatory response syndrome) Active Problems:   Mucositis due to chemotherapy   Acute renal failure   Atrial flutter   Sleep apnea   Depression   Hyponatremia   Normocytic anemia   Metastasis from bladder cancer   Cutaneous lupus erythematosus   Cancer associated pain   Neoplastic malignant related fatigue   Generalized weakness   Anemia   Protein-calorie malnutrition, severe   Encounter for palliative care   History of present illness:  James [name] is a 79 y.o. male with bladder cancer with metastasis on experimental chemotherapy, a flutter, cutaneous lupus, coronary artery disease, obstructive sleep apnea on C Pap admitted for fevers and found to have mucositis.  Hospital Course:  Principal Problem:  Fever 101.6, tachycardia, tachypnea-SIRS - Etiology unclear-possibly acute sinusitis? Yellow green discharge from nose has resolved for now- I have asked him to continue to monitor at home for recurrence and for fevers  -No obvious infection around Port-A-Cath -blood cultures negative -ID has been assisting with management -Patient was initially started on vancomycin and Zosyn changed to vancomycin and ceftazidime on 9/20  -Vancomycin discontinued on 9/20  -Ceftaz discontinued on 9/22 by ID - ID does not recommend and further antibiotics at this point  "red man" syndrome -In relation to vancomycin which was discontinued  Severe mucositis/ Cancer associated pain -Mucositis likely In relation to chemotherapy -Continue lidocaine mouthwash -Also placed on MS  Contin and oxycodone to help alleviate pain -Tolerating a soft diet  Acute renal insufficiency/ hyponatremia -Prerenal-Improved with hydration with IV fluids  Metastatic bladder cancer -Patient and wife have decided to stop chemotherapy and have decided to accept hospice services at home which is currently being arranged   Atrial flutter -Status post ablation in 2012 and not on anticoagulation -Currently in sinus rhythm -Continue Coreg   Sleep apnea -Continue C Pap   Depression -Continue current treatment   Normocytic anemia -likely anemia of chronic disease   Consultations:  Infectious disease  Palliative care  Discharge Exam: Filed Weights   04/26/15 2043  Weight: 100.699 kg (222 lb)   Filed Vitals:   05/04/15 0500  BP: 125/64  Pulse: 56  Temp: 97.6 F (36.4 C)  Resp: 18    General: AAO x 3, no distress Cardiovascular: RRR, no murmurs  Respiratory: clear to auscultation bilaterally GI: soft, non-tender, non-distended, bowel sound positive  Discharge Instructions You were cared for by a hospitalist during your hospital stay. If you have any questions about your discharge medications or the care you received while you were in the hospital after you are discharged, you can call the unit and asked to speak with the hospitalist on call if the hospitalist that took care of you is not available. Once you are discharged, your primary care physician will handle any further medical issues. Please note that NO REFILLS for any discharge medications will be authorized once you are discharged, as it is imperative that you return to your primary care physician (or establish a relationship with a primary care physician if you do not have one) for your aftercare needs so that they can reassess your need for medications  and monitor your lab values.      Discharge Instructions    Discharge instructions    Complete by:  As directed   Regular diet as tolerated     Increase  activity slowly    Complete by:  As directed             Medication List    STOP taking these medications        sevelamer carbonate 800 MG tablet  Commonly known as:  RENVELA      TAKE these medications        acetaminophen 500 MG tablet  Commonly known as:  TYLENOL  Take 500 mg by mouth every 6 (six) hours as needed for fever.     antiseptic oral rinse Liqd  15 mLs by Mouth Rinse route as needed for dry mouth.     bisacodyl 10 MG suppository  Commonly known as:  DULCOLAX  Place 1 suppository (10 mg total) rectally daily as needed for moderate constipation.     carvedilol 6.25 MG tablet  Commonly known as:  COREG  TAKE 1 TABLET (6.25 MG TOTAL) BY MOUTH 2 (TWO) TIMES DAILY.     docusate sodium 100 MG capsule  Commonly known as:  COLACE  Take 2 capsules (200 mg total) by mouth at bedtime.     doxepin 10 MG/ML solution  Commonly known as:  SINEQUAN  Take 2.5 mLs (25 mg total) by mouth 3 (three) times daily between meals as needed (for increased secretions).     Investigational - Study Medication  Take 13.5 mg by mouth See admin instructions. Additional Study Details: [place] [id number] Delfin Edis INCB [phone]) -eIRB # [id number] chemo tablet  14 days on, 7 days off. Take with water on an empty stomach.     lidocaine-prilocaine cream  Commonly known as:  EMLA  Apply topically as needed for port     loperamide 2 MG tablet  Commonly known as:  IMODIUM A-D  Take 2 mg by mouth 3 (three) times daily as needed for diarrhea or loose stools.     magic mouthwash w/lidocaine Soln  Take 5 mLs by mouth 3 (three) times daily as needed for mouth pain. NO BENADRYL     morphine 30 MG 12 hr tablet  Commonly known as:  MS CONTIN  Take 1 tablet (30 mg total) by mouth at bedtime.     morphine 15 MG 12 hr tablet  Commonly known as:  MS CONTIN  Take 1 tablet (15 mg total) by mouth daily.     mucosal barrier oral Gel  Take 1 packet by mouth 3 (three) times daily as needed.      MULTIPLE VITAMIN PO  Take by mouth daily.     NITROSTAT 0.4 MG SL tablet  Generic drug:  nitroGLYCERIN  PLACE 1 TABLET UNDER THE TONGUE EVERY 5 MINUTES AS NEEDED FOR CHEST PAIN     OMEGA 3 PO  Take 1 capsule by mouth 2 (two) times daily. [phone] mg     oxyCODONE 5 MG immediate release tablet  Commonly known as:  Oxy IR/ROXICODONE  Take 1 tablet (5 mg total) by mouth every 6 (six) hours as needed for breakthrough pain.     polyethylene glycol packet  Commonly known as:  MIRALAX / GLYCOLAX  Take 17 g by mouth 2 (two) times daily as needed.     PROBIOTIC & ACIDOPHILUS EX ST PO  Take by mouth daily.     prochlorperazine 10  MG tablet  Commonly known as:  COMPAZINE  Take 1 tablet (10 mg total) by mouth every 6 (six) hours as needed for nausea or vomiting.     sertraline 100 MG tablet  Commonly known as:  ZOLOFT  Take 150 mg by mouth daily.     venlafaxine XR 75 MG 24 hr capsule  Commonly known as:  EFFEXOR-XR  Take 75 mg by mouth daily with breakfast.       Allergies  Allergen Reactions  . Benadryl [Diphenhydramine Hcl] Other (See Comments)    Pt becomes very disoriented, extremely tired, memory loss  . Morphine Nausea Only    OK if given slowly.  . Other Other (See Comments)    Patient states when he takes flomax and protonix together makes him break out in a rash  . Adhesive [Tape] Itching and Rash    Blisters, tears skin  . Bupropion Hives and Rash  . Flomax [Tamsulosin Hcl] Rash    When taken with protonix only patient can take this alone  . Pantoprazole Sodium Rash    When taken with flomax only patient can take this alone  . Tamsulosin Rash    When taken with protonix only patient can take this alone      The results of significant diagnostics from this hospitalization (including imaging, microbiology, ancillary and laboratory) are listed below for reference.    Significant Diagnostic Studies: Dg Chest 2 View  04/26/2015   CLINICAL DATA:  Possible sepsis.  Severe weakness. Diffuse aches and pains. Shortness of breath this evening.  EXAM: CHEST  2 VIEW  COMPARISON:  Chest CT 12/26/2014  FINDINGS: Tip of the right chest port at the atrial caval junction. Lung volumes are low, heart size is normal for technique. There are small bilateral pleural effusions with adjacent atelectasis. No pulmonary edema. The upper lungs are clear. No pneumothorax. No acute osseous abnormalities are seen.  IMPRESSION: Small pleural effusions with adjacent bibasilar atelectasis.   Electronically Signed   By: Jeb Levering M.D.   On: 04/26/2015 22:49    Microbiology: Recent Results (from the past 240 hour(s))  Culture, blood (routine x 2)     Status: None   Collection Time: 04/26/15  9:27 PM  Result Value Ref Range Status   Specimen Description BLOOD LEFT ARM  Final   Special Requests BOTTLES DRAWN AEROBIC AND ANAEROBIC 10CC  Final   Culture NO GROWTH 5 DAYS  Final   Report Status 05/02/2015 FINAL  Final  Culture, blood (routine x 2)     Status: None   Collection Time: 04/26/15  9:33 PM  Result Value Ref Range Status   Specimen Description BLOOD LEFT HAND  Final   Special Requests BOTTLES DRAWN AEROBIC ONLY 10CC  Final   Culture NO GROWTH 5 DAYS  Final   Report Status 05/02/2015 FINAL  Final  Urine culture     Status: None   Collection Time: 04/26/15 11:30 PM  Result Value Ref Range Status   Specimen Description URINE, CATHETERIZED  Final   Special Requests NONE  Final   Culture MULTIPLE SPECIES PRESENT, SUGGEST RECOLLECTION  Final   Report Status 04/29/2015 FINAL  Final  Culture, blood (routine x 2)     Status: None   Collection Time: 04/28/15  1:00 PM  Result Value Ref Range Status   Specimen Description BLOOD RIGHT ANTECUBITAL  Final   Special Requests BOTTLES DRAWN AEROBIC AND ANAEROBIC 10CC  Final   Culture NO GROWTH 5 DAYS  Final   Report Status 05/03/2015 FINAL  Final  Culture, blood (routine x 2)     Status: None   Collection Time: 04/28/15  1:15 PM   Result Value Ref Range Status   Specimen Description BLOOD LEFT ANTECUBITAL  Final   Special Requests BOTTLES DRAWN AEROBIC AND ANAEROBIC 10CC  Final   Culture NO GROWTH 5 DAYS  Final   Report Status 05/03/2015 FINAL  Final     Labs: Basic Metabolic Panel:  Recent Labs Lab 04/28/15 0528 04/29/15 0724 05/03/15 0455 05/04/15 0447  NA 128* 128* 133*  --   K 3.9 3.8 3.8  --   CL 98* 99* 105  --   CO2 23 22 23   --   GLUCOSE 102* 109* 90  --   BUN 18 14 10   --   CREATININE 1.44* 1.28* 1.04 1.10  CALCIUM 8.2* 8.1* 8.2*  --    Liver Function Tests:  Recent Labs Lab 04/29/15 0724  AST 23  ALT 12*  ALKPHOS 64  BILITOT 0.6  PROT 6.2*  ALBUMIN 2.5*   No results for input(s): LIPASE, AMYLASE in the last 168 hours. No results for input(s): AMMONIA in the last 168 hours. CBC:  Recent Labs Lab 04/28/15 0528 04/29/15 0724 04/30/15 0529  WBC 5.1 5.0 4.7  NEUTROABS  --  3.6 3.4  HGB 8.5* 8.5* 8.7*  HCT 26.9* 26.3* 27.2*  MCV 87.1 85.7 85.8  PLT 195 183 196   Cardiac Enzymes: No results for input(s): CKTOTAL, CKMB, CKMBINDEX, TROPONINI in the last 168 hours. BNP: BNP (last 3 results)  Recent Labs  08/29/14 0135  BNP 117.9*    ProBNP (last 3 results) No results for input(s): PROBNP in the last 8760 hours.  CBG: No results for input(s): GLUCAP in the last 168 hours.     SignedDebbe Odea, MD [facility] 05/04/2015, 11:18 AM

## 2015-05-03 NOTE — Care Management Note (Signed)
Case Management Note  Patient Details  Name: HAMMOND OBEIRNE MRN: [id number] Date of Birth: 27-Apr-1936  Subjective/Objective:                    Action/Plan: Spoke with patient's wife , sister and patient at bedside. They have decided to go home with hospice. They had [facility] following for palliative services, they would like to change to hospice with [facility]. Referral made.  Expected Discharge Date:                  Expected Discharge Plan:  Home w Hospice Care  In-House Referral:     Discharge planning Services  CM Consult  Post Acute Care Choice:    Choice offered to:  Patient, Spouse, Sibling  DME Arranged:    DME Agency:     HH Arranged:    [place] Agency:  [facility]  Status of Service:  In process, will continue to follow  Medicare Important Message Given:  Yes-third notification given Date Medicare IM Given:    Medicare IM give by:    Date Additional Medicare IM Given:    Additional Medicare Important Message give by:     If discussed at [place] of Stay Meetings, dates discussed:    Additional Comments:  Marilu Favre, RN 05/03/2015, 12:03 PM

## 2015-05-03 NOTE — Progress Notes (Addendum)
Nutrition Follow-up  DOCUMENTATION CODES:   Obesity unspecified  INTERVENTION:   -D/c Boost Breeze TID due to poor acceptance -Magic Cup BID with meals  NUTRITION DIAGNOSIS:   Inadequate oral intake related to poor appetite as evidenced by per patient/family report, meal completion < 50%.  Progressing  GOAL:   Patient will meet greater than or equal to 90% of their needs  Progressing  MONITOR:   PO intake, Supplement acceptance, Labs, Weight trends, Skin, I & O's  REASON FOR ASSESSMENT:   Malnutrition Screening Tool    ASSESSMENT:   James [name] is a 79 y.o. male with a past medical history significant for metastatic bladder cancer enrolled since July in Phase 1 trial at [facility] of FGFR inhibitor, ASCVD s/p PCI to LAD and RCA in 2014, OSA on CPAP who presents with fever and fatigue.  Pt's intake has improved. Pt has transitioned to a soft diet. Meal completion 50-100%. Per MAR, pt with poor acceptance of supplements. Noted d/c of Ensure Enlive, due to multiple refusals. MD increased Boost Breeze to TID, however, pt with multiple refusals as well. Will d/c supplements due to poor acceptance.   Palliative care following; pt may transition to home with hospice services. Pt and family to meet with hospice liaison.   Labs reviewed: Na: 133 (on IV replacement).  Diet Order:  DIET SOFT Room service appropriate?: Yes; Fluid consistency:: Thin  Skin:  Reviewed, no issues  Last BM:  04/30/15  Height:   Ht Readings from Last 1 Encounters:  04/26/15 5\' 8"  (1.727 m)    Weight:   Wt Readings from Last 1 Encounters:  04/26/15 222 lb (100.699 kg)    Ideal Body Weight:  70 kg  BMI:  Body mass index is 33.76 kg/(m^2).  Estimated Nutritional Needs:   Kcal:  2100-2300  Protein:  105-120 grams  Fluid:  >2.1 L  EDUCATION NEEDS:   Education needs addressed  Jenifer A. Jimmye Norman, RD, LDN, CDE Pager: [phone] After hours Pager: [phone]

## 2015-05-03 NOTE — Progress Notes (Signed)
Physical Therapy Treatment Patient Details Name: James [name] MRN: [id number] DOB: 07-03-1936 Today's Date: 05/03/2015    History of Present Illness Patient is a 79 y/o male with hx of CAD s/p stenting, A-flutter s/p ablation, metastatic bladder carcinoma enrolled since July in Phase 1 trial at [facility] of FGFR inhibitor, OSA on Cpap, dyslipidemia admitted for fever and fatigue.    PT Comments    Progressing towards functional goals. Ambulating up to 300 feet today with the aide of a rolling walker for support. Still with scrotal pain, guarding his movements. Did not require physical assist for bed mobility today. Motivated to maintain his functional independence. Patient will continue to benefit from skilled physical therapy services to further improve independence with functional mobility.   Follow Up Recommendations  Home health PT;Supervision for mobility/OOB     Equipment Recommendations  None recommended by PT    Recommendations for Other Services       Precautions / Restrictions Precautions Precautions: Fall Restrictions Weight Bearing Restrictions: No    Mobility  Bed Mobility Overal bed mobility: Needs Assistance Bed Mobility: Supine to Sit;Sit to Supine     Supine to sit: Supervision;HOB elevated Sit to supine: Supervision   General bed mobility comments: Supervision for safety with use of rails to rise but not to return to supine. Cues for technique.  Transfers Overall transfer level: Needs assistance Equipment used: Rolling walker (2 wheeled) Transfers: Sit to/from Stand Sit to Stand: Min guard         General transfer comment: Close guard for safety with cues for hand placement. Decreased control with descent. Performed from lowest bed setting. Denies dizziness today  Ambulation/Gait Ambulation/Gait assistance: Supervision Ambulation Distance (Feet): 300 Feet Assistive device: Rolling walker (2 wheeled) Gait Pattern/deviations: Step-through  pattern;Decreased stride length;Wide base of support Gait velocity: decreased   General Gait Details: Moderately guarded but stable with the aide of a rolling walker for support. Only required rest breaks to adjust cath due to scrotal pain. Educated on energy conservation techniques. VC for walker placement intermittently for proximity with navigating in narrow areas.   Stairs            Wheelchair Mobility    Modified Rankin (Stroke Patients Only)       Balance                                    Cognition Arousal/Alertness: Awake/alert Behavior During Therapy: WFL for tasks assessed/performed Overall Cognitive Status: Within Functional Limits for tasks assessed                      Exercises      General Comments General comments (skin integrity, edema, etc.): Family present, very supportive. Discussed positioning for edema      Pertinent Vitals/Pain Pain Assessment: Faces Faces Pain Scale: Hurts little more Pain Location: scrotum Pain Descriptors / Indicators: Grimacing;Guarding Pain Intervention(s): Monitored during session;Repositioned    Home Living                      Prior Function            PT Goals (current goals can now be found in the care plan section) Acute Rehab PT Goals Patient Stated Goal: to feel better PT Goal Formulation: With patient Time For Goal Achievement: 05/11/15 Potential to Achieve Goals: Fair Progress towards PT goals: Progressing toward goals  Frequency  Min 4X/week    PT Plan Current plan remains appropriate    Co-evaluation             End of Session Equipment Utilized During Treatment: Gait belt Activity Tolerance: Patient tolerated treatment well Patient left: in bed;with call bell/phone within reach;with family/visitor present     Time: 4076-8088 PT Time Calculation (min) (ACUTE ONLY): 24 min  Charges:  $Gait Training: 8-22 mins $Therapeutic Activity: 8-22 mins                     G Codes:      Ellouise Newer 16-May-2015, 4:38 PM Camille Bal [place], [place]

## 2015-05-03 NOTE — Care Management Important Message (Signed)
Important Message  Patient Details  Name: James [name] MRN: [id number] Date of Birth: February 19, 1936   Medicare Important Message Given:  Yes-third notification given    Delorse Lek 05/03/2015, 9:26 AM

## 2015-05-03 NOTE — Progress Notes (Signed)
INFECTIOUS DISEASE PROGRESS NOTE  ID: James [name] is a 79 y.o. male with  Principal Problem:   SIRS (systemic inflammatory response syndrome) Active Problems:   Atrial flutter   Sleep apnea   Depression   Hyponatremia   Normocytic anemia   Metastasis from bladder cancer   Cutaneous lupus erythematosus   Cancer associated pain   Neoplastic malignant related fatigue   Generalized weakness   Anemia   Protein-calorie malnutrition, severe   Mucositis due to chemotherapy   Encounter for palliative care   Acute renal failure  Subjective: Up ambulating.  Rash better.  Swallowing better.   Abtx:  Anti-infectives    Start     Dose/Rate Route Frequency Ordered Stop   04/28/15 2200  vancomycin (VANCOCIN) 1,250 mg in sodium chloride 0.9 % 250 mL IVPB  Status:  Discontinued     1,250 mg 166.7 mL/hr over 90 Minutes Intravenous Every 24 hours 04/28/15 1015 05/01/15 1459   04/28/15 2200  cefTAZidime (FORTAZ) 2 g in dextrose 5 % 50 mL IVPB     2 g 100 mL/hr over 30 Minutes Intravenous Every 12 hours 04/28/15 1545     04/27/15 0700  piperacillin-tazobactam (ZOSYN) IVPB 3.375 g  Status:  Discontinued     3.375 g 12.5 mL/hr over 240 Minutes Intravenous Every 8 hours 04/27/15 0304 04/28/15 1531   04/27/15 0330  vancomycin (VANCOCIN) IVPB 750 mg/150 ml premix  Status:  Discontinued     750 mg 150 mL/hr over 60 Minutes Intravenous Every 12 hours 04/27/15 0304 04/28/15 1015   04/26/15 2330  piperacillin-tazobactam (ZOSYN) IVPB 3.375 g     3.375 g 100 mL/hr over 30 Minutes Intravenous  Once 04/26/15 2326 04/27/15 0123   04/26/15 2330  vancomycin (VANCOCIN) IVPB 1000 mg/200 mL premix     1,000 mg 200 mL/hr over 60 Minutes Intravenous  Once 04/26/15 2326 04/27/15 0225      Medications:  Scheduled: . acetaminophen  650 mg Oral 4 times per day  . acidophilus  1 capsule Oral Daily  . bisacodyl  10 mg Rectal Daily  . carvedilol  6.25 mg Oral BID WC  . cefTAZidime (FORTAZ)  IV  2 g  Intravenous Q12H  . enoxaparin (LOVENOX) injection  40 mg Subcutaneous Daily  . morphine  15 mg Oral BID  . mucosal barrier oral  1 packet Oral TID  . sertraline  150 mg Oral Daily  . venlafaxine XR  75 mg Oral Q breakfast    Objective: Vital signs in last 24 hours: Temp:  [97.7 F (36.5 C)-99.1 F (37.3 C)] 98.6 F (37 C) (09/22 1400) Pulse Rate:  [67-72] 69 (09/22 1400) Resp:  [16-18] 16 (09/22 1400) BP: (103-134)/(52-69) 117/69 mmHg (09/22 1400) SpO2:  [94 %-96 %] 94 % (09/22 0617)   General appearance: alert, cooperative, fatigued and facial and neck rash improved.  Throat: abnormal findings: severe mucositis and aphthous ulcers.   Lab Results  Recent Labs  05/03/15 0455  NA 133*  K 3.8  CL 105  CO2 23  BUN 10  CREATININE 1.04   Liver Panel No results for input(s): PROT, ALBUMIN, AST, ALT, ALKPHOS, BILITOT, BILIDIR, IBILI in the last 72 hours. Sedimentation Rate No results for input(s): ESRSEDRATE in the last 72 hours. C-Reactive Protein No results for input(s): CRP in the last 72 hours.  Microbiology: Recent Results (from the past 240 hour(s))  Culture, blood (routine x 2)     Status: None   Collection Time:  04/26/15  9:27 PM  Result Value Ref Range Status   Specimen Description BLOOD LEFT ARM  Final   Special Requests BOTTLES DRAWN AEROBIC AND ANAEROBIC 10CC  Final   Culture NO GROWTH 5 DAYS  Final   Report Status 05/02/2015 FINAL  Final  Culture, blood (routine x 2)     Status: None   Collection Time: 04/26/15  9:33 PM  Result Value Ref Range Status   Specimen Description BLOOD LEFT HAND  Final   Special Requests BOTTLES DRAWN AEROBIC ONLY 10CC  Final   Culture NO GROWTH 5 DAYS  Final   Report Status 05/02/2015 FINAL  Final  Urine culture     Status: None   Collection Time: 04/26/15 11:30 PM  Result Value Ref Range Status   Specimen Description URINE, CATHETERIZED  Final   Special Requests NONE  Final   Culture MULTIPLE SPECIES PRESENT, SUGGEST  RECOLLECTION  Final   Report Status 04/29/2015 FINAL  Final  Culture, blood (routine x 2)     Status: None   Collection Time: 04/28/15  1:00 PM  Result Value Ref Range Status   Specimen Description BLOOD RIGHT ANTECUBITAL  Final   Special Requests BOTTLES DRAWN AEROBIC AND ANAEROBIC 10CC  Final   Culture NO GROWTH 5 DAYS  Final   Report Status 05/03/2015 FINAL  Final  Culture, blood (routine x 2)     Status: None   Collection Time: 04/28/15  1:15 PM  Result Value Ref Range Status   Specimen Description BLOOD LEFT ANTECUBITAL  Final   Special Requests BOTTLES DRAWN AEROBIC AND ANAEROBIC 10CC  Final   Culture NO GROWTH 5 DAYS  Final   Report Status 05/03/2015 FINAL  Final    Studies/Results: No results found.   Assessment/Plan: Metastatic bladder CA (last CTX 9-14 [facility]) Fever from chemotherapeutic agent Mucositis Cutaneous lupus Protein calorie maluntrition, severe Adverse Drug Reaction  Total days of antibiotics: 5 ceftaz  I have a low suspicion for bacterial infection His rash has improved with stopping vanco Will stop his ceftaz Available as needed.  Home hospice Continue local wound care of his mucositis.        Bobby Rumpf Infectious Diseases (pager) [phone] [email or web address] 05/03/2015, 4:06 PM  LOS: 6 days

## 2015-05-04 ENCOUNTER — Telehealth: Payer: Self-pay

## 2015-05-04 LAB — CREATININE, SERUM: CREATININE: 1.1 mg/dL (ref 0.61–1.24)

## 2015-05-04 MED ORDER — BIOTENE DRY MOUTH MT LIQD: 15.0000 mL | OROMUCOSAL | Status: AC | PRN

## 2015-05-04 MED ORDER — DOCUSATE SODIUM 100 MG PO CAPS: 200.0000 mg | ORAL_CAPSULE | Freq: Every day | ORAL | Status: AC

## 2015-05-04 MED ORDER — MORPHINE SULFATE ER 15 MG PO TBCR: 15.0000 mg | EXTENDED_RELEASE_TABLET | Freq: Every day | ORAL | Status: AC

## 2015-05-04 MED ORDER — DOXEPIN HCL 10 MG/ML PO CONC: 25.0000 mg | Freq: Three times a day (TID) | ORAL | Status: AC | PRN

## 2015-05-04 MED ORDER — GELCLAIR MT GEL: 1.0000 | Freq: Three times a day (TID) | OROMUCOSAL | Status: AC | PRN

## 2015-05-04 MED ORDER — POLYETHYLENE GLYCOL 3350 17 G PO PACK: 17.0000 g | PACK | Freq: Two times a day (BID) | ORAL | Status: AC | PRN

## 2015-05-04 MED ORDER — PROCHLORPERAZINE MALEATE 10 MG PO TABS: 10.0000 mg | ORAL_TABLET | Freq: Four times a day (QID) | ORAL | Status: AC | PRN

## 2015-05-04 MED ORDER — HEPARIN SOD (PORK) LOCK FLUSH 100 UNIT/ML IV SOLN
500.0000 [IU] | INTRAVENOUS | Status: AC | PRN
  Administered 2015-05-04: 500 [IU]

## 2015-05-04 MED ORDER — OXYCODONE HCL 5 MG PO TABS: 5.0000 mg | ORAL_TABLET | Freq: Four times a day (QID) | ORAL | Status: AC | PRN

## 2015-05-04 MED ORDER — MORPHINE SULFATE ER 30 MG PO TBCR: 30.0000 mg | EXTENDED_RELEASE_TABLET | Freq: Every day | ORAL | Status: AC

## 2015-05-04 NOTE — Progress Notes (Signed)
Notified by Nira Conn CMRN yesterday of pt./family request for Hospice and Palliative Care services at home after discharge. Patient is currently a Palliative care patient with [facility].  Writer spoke with patient, his wife and sister at the bedside yesterday afternoon to initiate education related to hospice philosophy, services and team approach to care. Family voiced understanding of information provided. Per discussion patient is to discharge home today by private motor vehicle.  Please send signed and coml;eted DNR form home with patient. Patient will need prescriptions for discharge comfort medications.  DME needs discussed and pt's wife would like to wait until pt. Is at home to order equipment.  [facility] aware of above. Please notify [facility] when pt. Is ready to leave unit at discharge - call [phone] ( or [phone] if after 5 pm). [facility] contact numbers and information  have been given to pt's wife during visit. Above information shared with Nira Conn, [facility]. Please call with any questions.  [facility]iaison [phone]

## 2015-05-04 NOTE — Care Management Important Message (Signed)
Important Message  Patient Details  Name: James [name] MRN: [id number] Date of Birth: [birth year]   Medicare Important Message Given:  Yes-fourth notification given    Loann Quill 05/04/2015, 11:19 AM

## 2015-05-04 NOTE — Progress Notes (Signed)
[facility]  Patient evaluated today. Discharge plan discussed in detail with patient and sister. Medication changes addressed. Please see my d/c summary from 9/22.   Debbe Odea, MD

## 2015-05-04 NOTE — Progress Notes (Signed)
Patient discharged to home with instructions. 

## 2015-05-04 NOTE — Progress Notes (Signed)
Physical Therapy Treatment Patient Details Name: James [name] MRN: [id number] DOB: [birth year] Today's Date: 05/04/2015    History of Present Illness Patient is a 79 y/o male with hx of CAD s/p stenting, A-flutter s/p ablation, metastatic bladder carcinoma enrolled since July in Phase 1 trial at [facility] of FGFR inhibitor, OSA on Cpap, dyslipidemia admitted for fever and fatigue.    PT Comments    Stair training completed and pt appropriate for d/c from a mobility standpoint.  Pt requires cues for upright posture using RW but ambulated 300 ft in hallway.    Follow Up Recommendations  Home health PT;Supervision for mobility/OOB     Equipment Recommendations  None recommended by PT    Recommendations for Other Services       Precautions / Restrictions Precautions Precautions: Fall Restrictions Weight Bearing Restrictions: No    Mobility  Bed Mobility Overal bed mobility: Modified Independent Bed Mobility: Sit to Supine;Supine to Sit     Supine to sit: Modified independent (Device/Increase time) Sit to supine: Modified independent (Device/Increase time)   General bed mobility comments: Mod I w/ increased time but no use of bed rails and HOB flat.  Transfers Overall transfer level: Needs assistance Equipment used: Rolling walker (2 wheeled) Transfers: Sit to/from Stand Sit to Stand: Min guard         General transfer comment: Close guard for safety with cues for hand placement. Pt w/ controlled descent this session.    Ambulation/Gait Ambulation/Gait assistance: Supervision Ambulation Distance (Feet): 300 Feet Assistive device: Rolling walker (2 wheeled) Gait Pattern/deviations: Step-through pattern;Decreased stride length Gait velocity: decreased   General Gait Details: Steady w/ use of RW but required cues to stand upright.  Only one rest break to adjust scrotum.     Stairs Stairs: Yes Stairs assistance: Min guard Stair Management: One rail Left;Step to  pattern;Forwards Number of Stairs: 2 General stair comments: Cues for safe technique.  Sister present during stair training.  Pt w/ safe technique.  Wheelchair Mobility    Modified Rankin (Stroke Patients Only)       Balance Overall balance assessment: Needs assistance Sitting-balance support: No upper extremity supported;Feet supported Sitting balance-Leahy Scale: Fair     Standing balance support: Bilateral upper extremity supported;During functional activity Standing balance-Leahy Scale: Fair Standing balance comment: Continues to lean forward and rely on RW for support                    Cognition Arousal/Alertness: Awake/alert Behavior During Therapy: WFL for tasks assessed/performed Overall Cognitive Status: Within Functional Limits for tasks assessed                      Exercises      General Comments General comments (skin integrity, edema, etc.): Pt reports pain in scrotum w/ mobility that feels like a hair is stuck but feels deeper than just the skin surface.  Able to alleviate pain w/ adjustment.      Pertinent Vitals/Pain Faces Pain Scale: Hurts little more Pain Location: scrotum Pain Descriptors / Indicators: Grimacing;Sharp Pain Intervention(s): Limited activity within patient's tolerance;Monitored during session;Repositioned    Home Living                      Prior Function            PT Goals (current goals can now be found in the care plan section) Acute Rehab PT Goals Patient Stated Goal: to go home and  get stronger PT Goal Formulation: With patient Time For Goal Achievement: 05/11/15 Potential to Achieve Goals: Fair Progress towards PT goals: Progressing toward goals    Frequency  Min 4X/week    PT Plan Current plan remains appropriate    Co-evaluation             End of Session Equipment Utilized During Treatment: Gait belt Activity Tolerance: Patient tolerated treatment well Patient left: in bed;with  call bell/phone within reach;with family/visitor present     Time: 1152-1221 PT Time Calculation (min) (ACUTE ONLY): 29 min  Charges:  $Gait Training: 23-37 mins                    G Codes:      Joslyn Hy PT, [place] [phone] Pager: [phone] 05/04/2015, 1:41 PM

## 2015-05-04 NOTE — Telephone Encounter (Signed)
Spoke with Tye [place] at [facility], confirmed Dr. Lindi Adie will serve as attending and would like [facility] assistance with symptom management.  She confirmed understanding.

## 2015-05-07 ENCOUNTER — Telehealth: Payer: Self-pay | Admitting: *Deleted

## 2015-05-07 NOTE — Telephone Encounter (Signed)
1) PT. IS HAVING SCROTAL EDEMA. 2) HE ALSO HAS A YEAST INFECTION IN HIS MOUTH. SUGGESTIONS FROM DR.GUDENA.

## 2015-05-08 NOTE — Telephone Encounter (Signed)
Please call in for nystatin swish and swallow 5 ml po bid x 7 days For scrotal edema, lasix 20 daily X 7 days

## 2015-05-09 ENCOUNTER — Telehealth: Payer: Self-pay | Admitting: *Deleted

## 2015-05-09 ENCOUNTER — Other Ambulatory Visit: Payer: Self-pay | Admitting: *Deleted

## 2015-05-09 DIAGNOSIS — C787 Secondary malignant neoplasm of liver and intrahepatic bile duct: Principal | ICD-10-CM

## 2015-05-09 DIAGNOSIS — C679 Malignant neoplasm of bladder, unspecified: Secondary | ICD-10-CM

## 2015-05-09 MED ORDER — NYSTATIN 100000 UNIT/ML MT SUSP: 5.0000 mL | Freq: Two times a day (BID) | OROMUCOSAL | Status: AC

## 2015-05-09 MED ORDER — FUROSEMIDE 20 MG PO TABS: 20.0000 mg | ORAL_TABLET | Freq: Every day | ORAL | Status: AC

## 2015-05-09 NOTE — Telephone Encounter (Signed)
Received call from Hospice about patient's scrotal swelling, Lasix 20 mg daily x 7 days called to pharmacy. Hospice nurse notified. Called Nystatin as well for oral thrush but dc'd as patient taking Diflucan.

## 2015-05-17 ENCOUNTER — Telehealth: Payer: Self-pay

## 2015-05-17 NOTE — Telephone Encounter (Signed)
Msg rcvd from hospice - pt has CBD oil and is using.  Pt claims it has helped with nausea and appetite.  MD notified.

## 2015-05-21 ENCOUNTER — Encounter: Payer: Self-pay | Admitting: *Deleted

## 2015-05-21 NOTE — Progress Notes (Signed)
Received notes from Hospice, sent to scan. 

## 2015-05-24 ENCOUNTER — Encounter: Payer: Self-pay | Admitting: *Deleted

## 2015-05-24 NOTE — Progress Notes (Signed)
Received care plan updates from Hospice, sent to scan.

## 2015-05-31 ENCOUNTER — Telehealth: Payer: Self-pay

## 2015-05-31 NOTE — Telephone Encounter (Signed)
Call rcvd from [place] at [facility].  Despite their best efforts, they are unable to manage his pain and anxiety at home.  Requested a verbal order for admission to [facility].  Per Dr. Lindi Adie, ok to admit pt to [facility].

## 2015-06-05 ENCOUNTER — Encounter: Payer: Self-pay | Admitting: *Deleted

## 2015-06-05 NOTE — Progress Notes (Signed)
Notes received from Hospice, sent to scan.

## 2015-06-18 ENCOUNTER — Telehealth: Payer: Self-pay | Admitting: Hematology and Oncology

## 2015-06-18 NOTE — Telephone Encounter (Signed)
Received death certificate 06/18/15 °

## 2015-07-12 DEATH — deceased

## 2015-11-29 IMAGING — CT CT ANGIO CHEST
1 of 8 series · 17 of 36 positions shown · IV contrast (omnipaque)
Comparison: Chest x-ray from today

CLINICAL DATA: Chest pain

EXAM:
CT ANGIOGRAPHY CHEST WITH CONTRAST
TECHNIQUE: Multidetector CT imaging of the chest was performed using the
standard protocol during bolus administration of intravenous
contrast. Multiplanar CT image reconstructions including MIPs were
obtained to evaluate the vascular anatomy.
CONTRAST:  100mL OMNIPAQUE IOHEXOL 350 MG/ML SOLN

[Series 5: pe thins · axial · 0.78mm/px · z∈[+813,+1084]mm · 17 of 305 slices shown]
[im 17/305  lung]
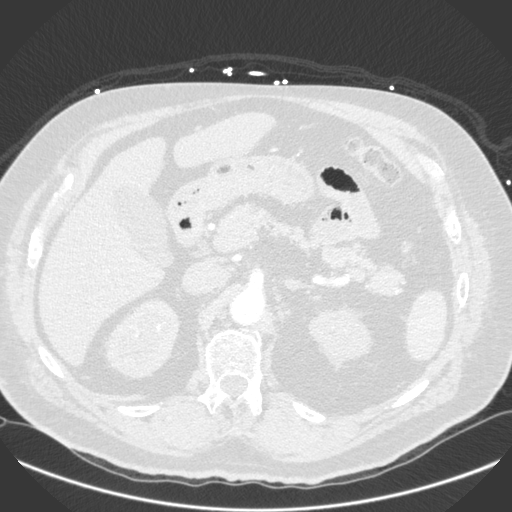
[im 34/305  mediastinal]
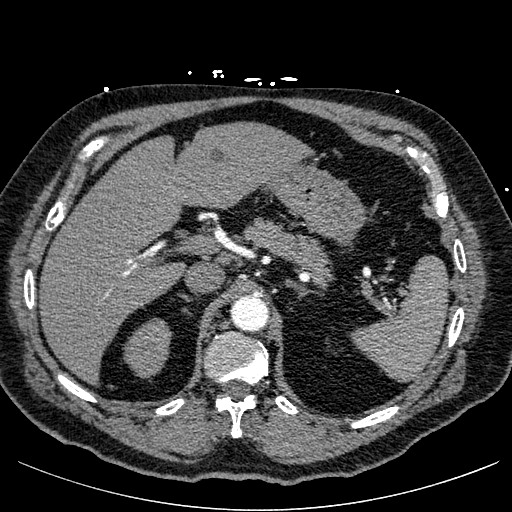
[im 51/305  lung]
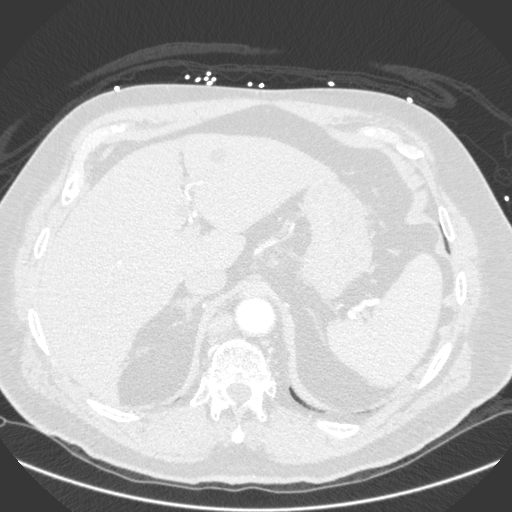
[im 68/305  mediastinal]
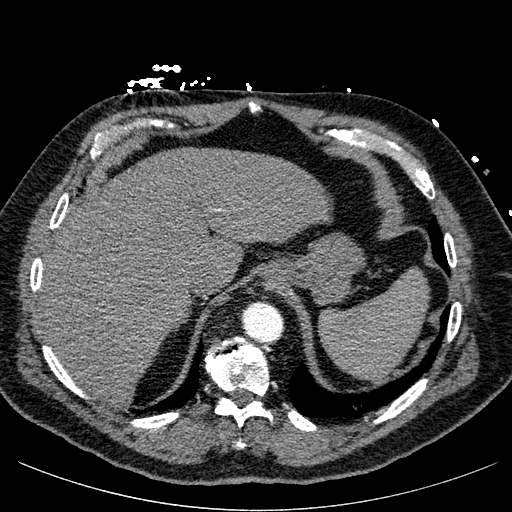
[im 85/305  lung]
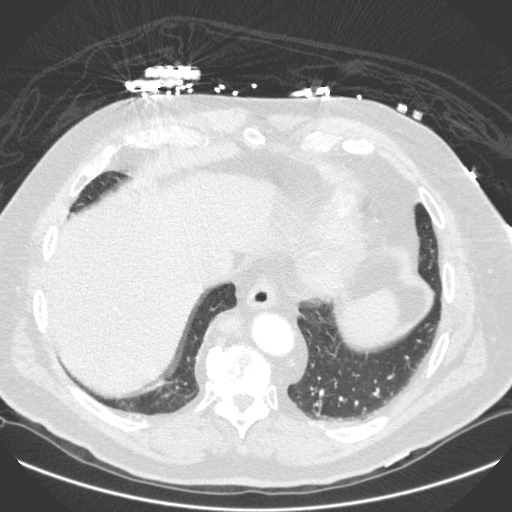
[im 102/305  mediastinal]
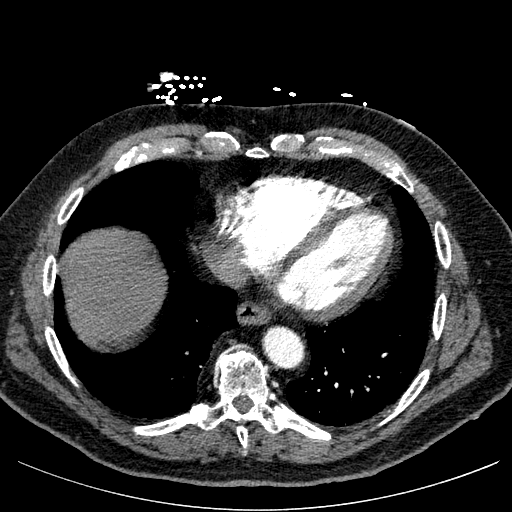
[im 119/305  lung]
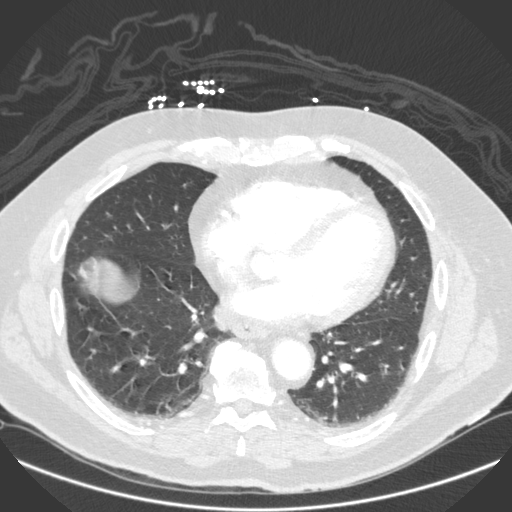
[im 136/305  mediastinal]
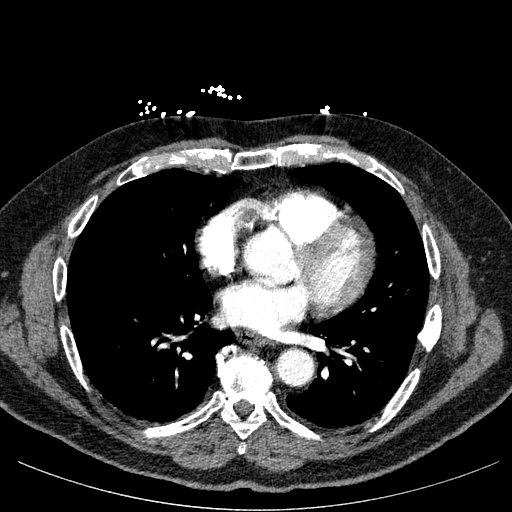
[im 153/305  lung]
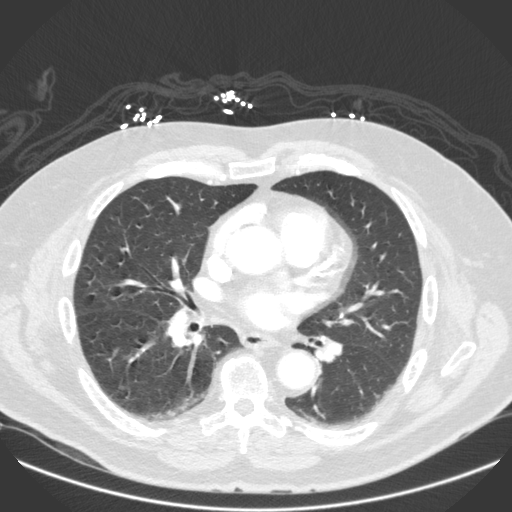
[im 169/305  mediastinal]
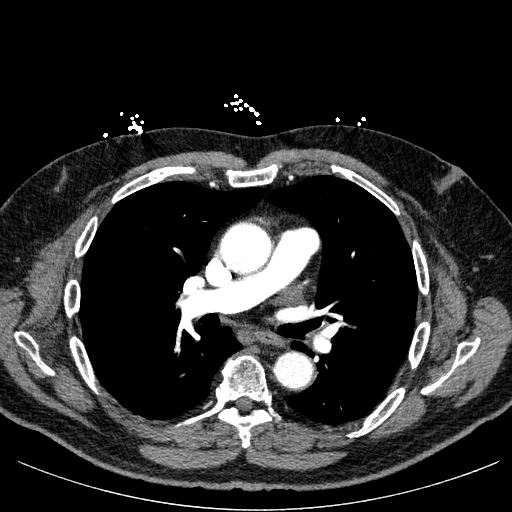
[im 186/305  lung]
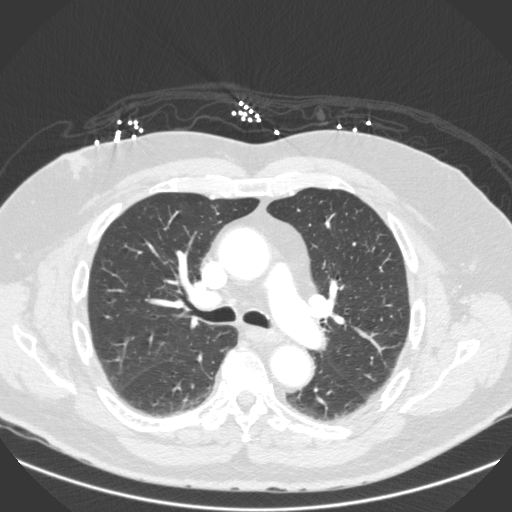
[im 203/305  mediastinal]
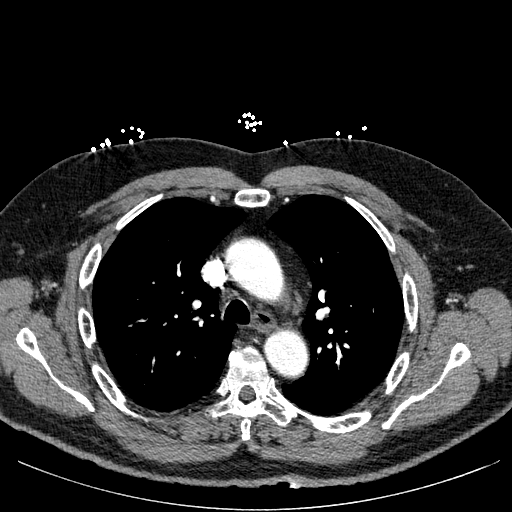
[im 220/305  lung]
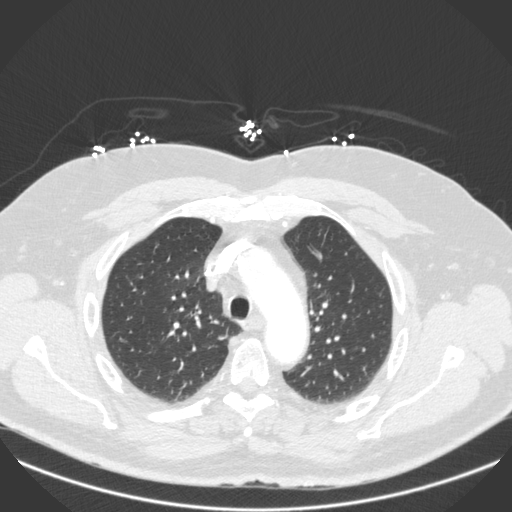
[im 237/305  mediastinal]
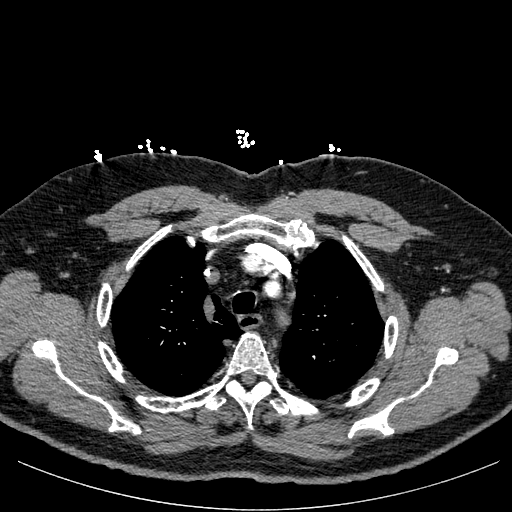
[im 254/305  lung]
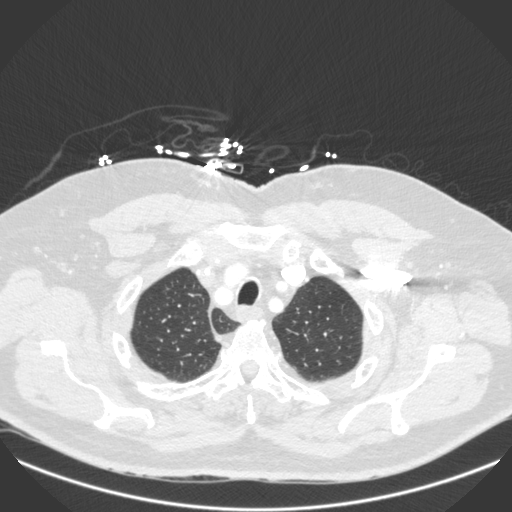
[im 271/305  mediastinal]
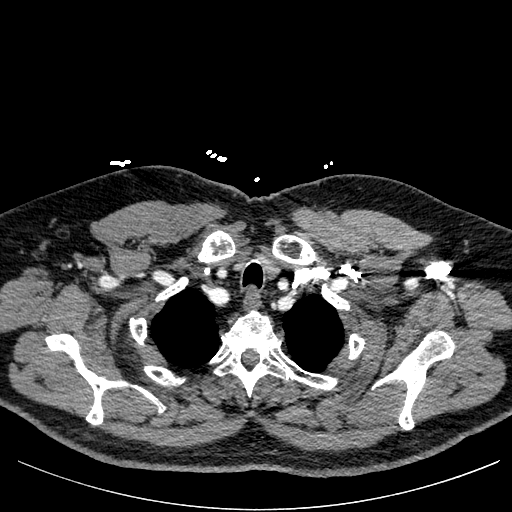
[im 288/305  lung]
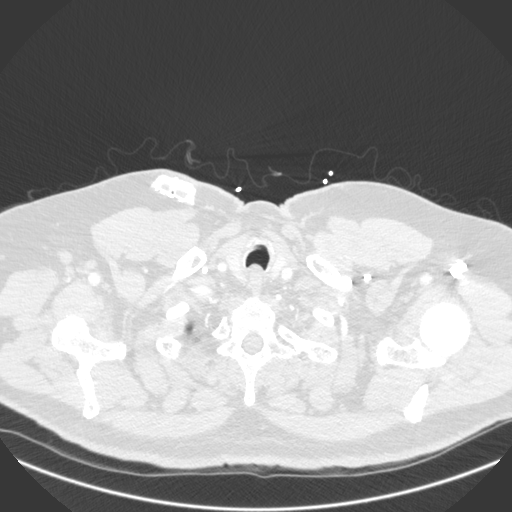

[17 of 36 positions shown; findings below may reference images not displayed]

FINDINGS: Negative for pulmonary embolism. Negative for aortic dissection.
Coronary artery calcification. Heart size within normal limits.

The lungs are clear. Negative for lung mass. No infiltrate or
effusion.

No mediastinal or hilar adenopathy. Retrocrural lymph nodes on the
right are prominent, measuring 22 x 13 mm, and 23 x 13 mm. Bilateral
hepatic cysts are present. No other adenopathy in the upper abdomen.

Review of the MIP images confirms the above findings.
IMPRESSION: Negative for pulmonary embolism.  No acute abnormality in the chest.

Retrocrural adenopathy which could be due to metastatic disease or
benign hypertrophy. No evidence of primary malignancy on this study.

## 2016-01-06 IMAGING — CR DG CHEST 2V
2 series · 2 of 2 positions shown · non-contrast
Comparison: CT ANGIO CHEST W/CM &/OR WO/CM dated 08/02/2013; DG
CHEST 1V PORT dated 08/02/2013; DG CHEST 2 VIEW dated 07/17/2013

CLINICAL DATA: Shortness of breath with exertion. Abnormal breath
sounds.

EXAM:
CHEST - 2 VIEW

[w chest pa]
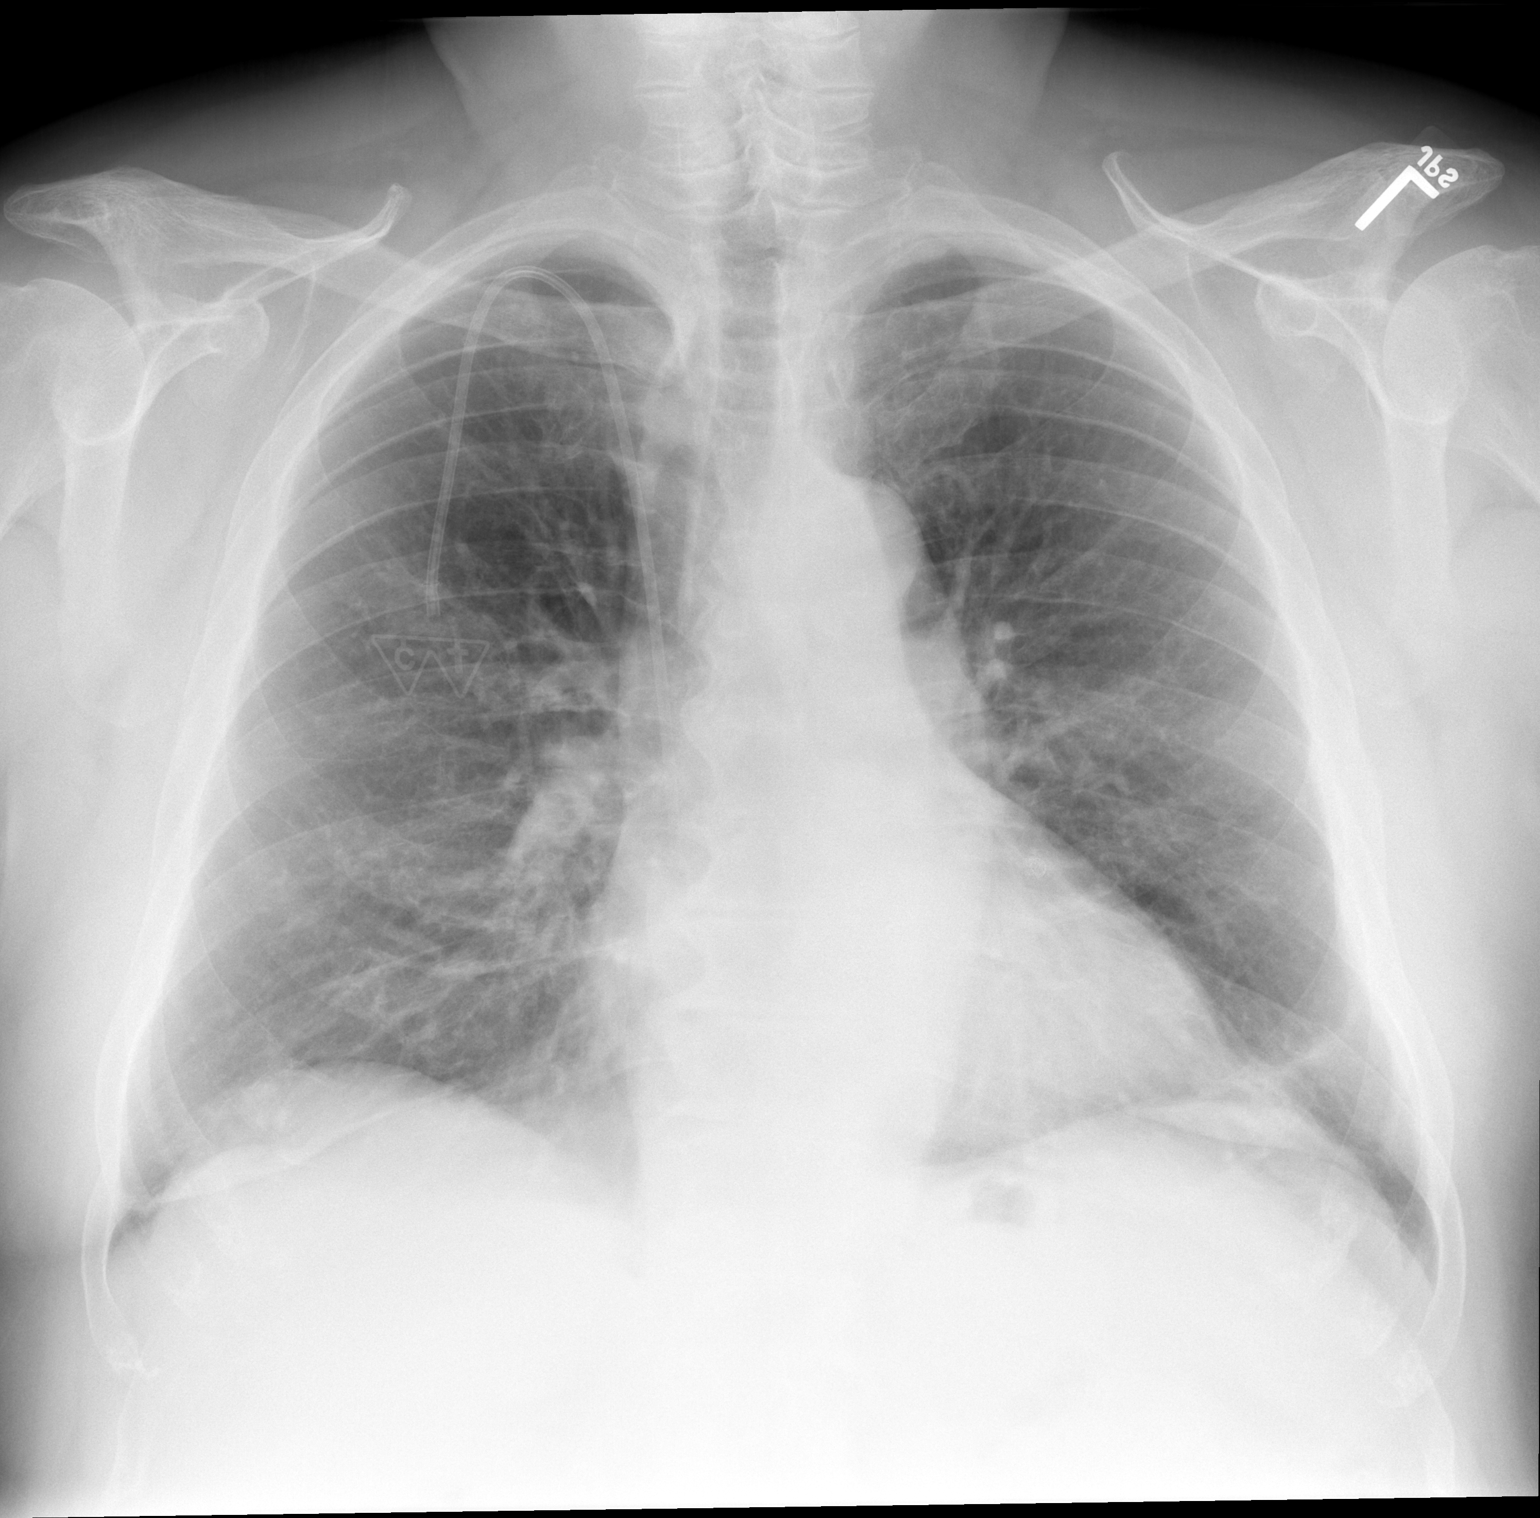

[w chest lat]
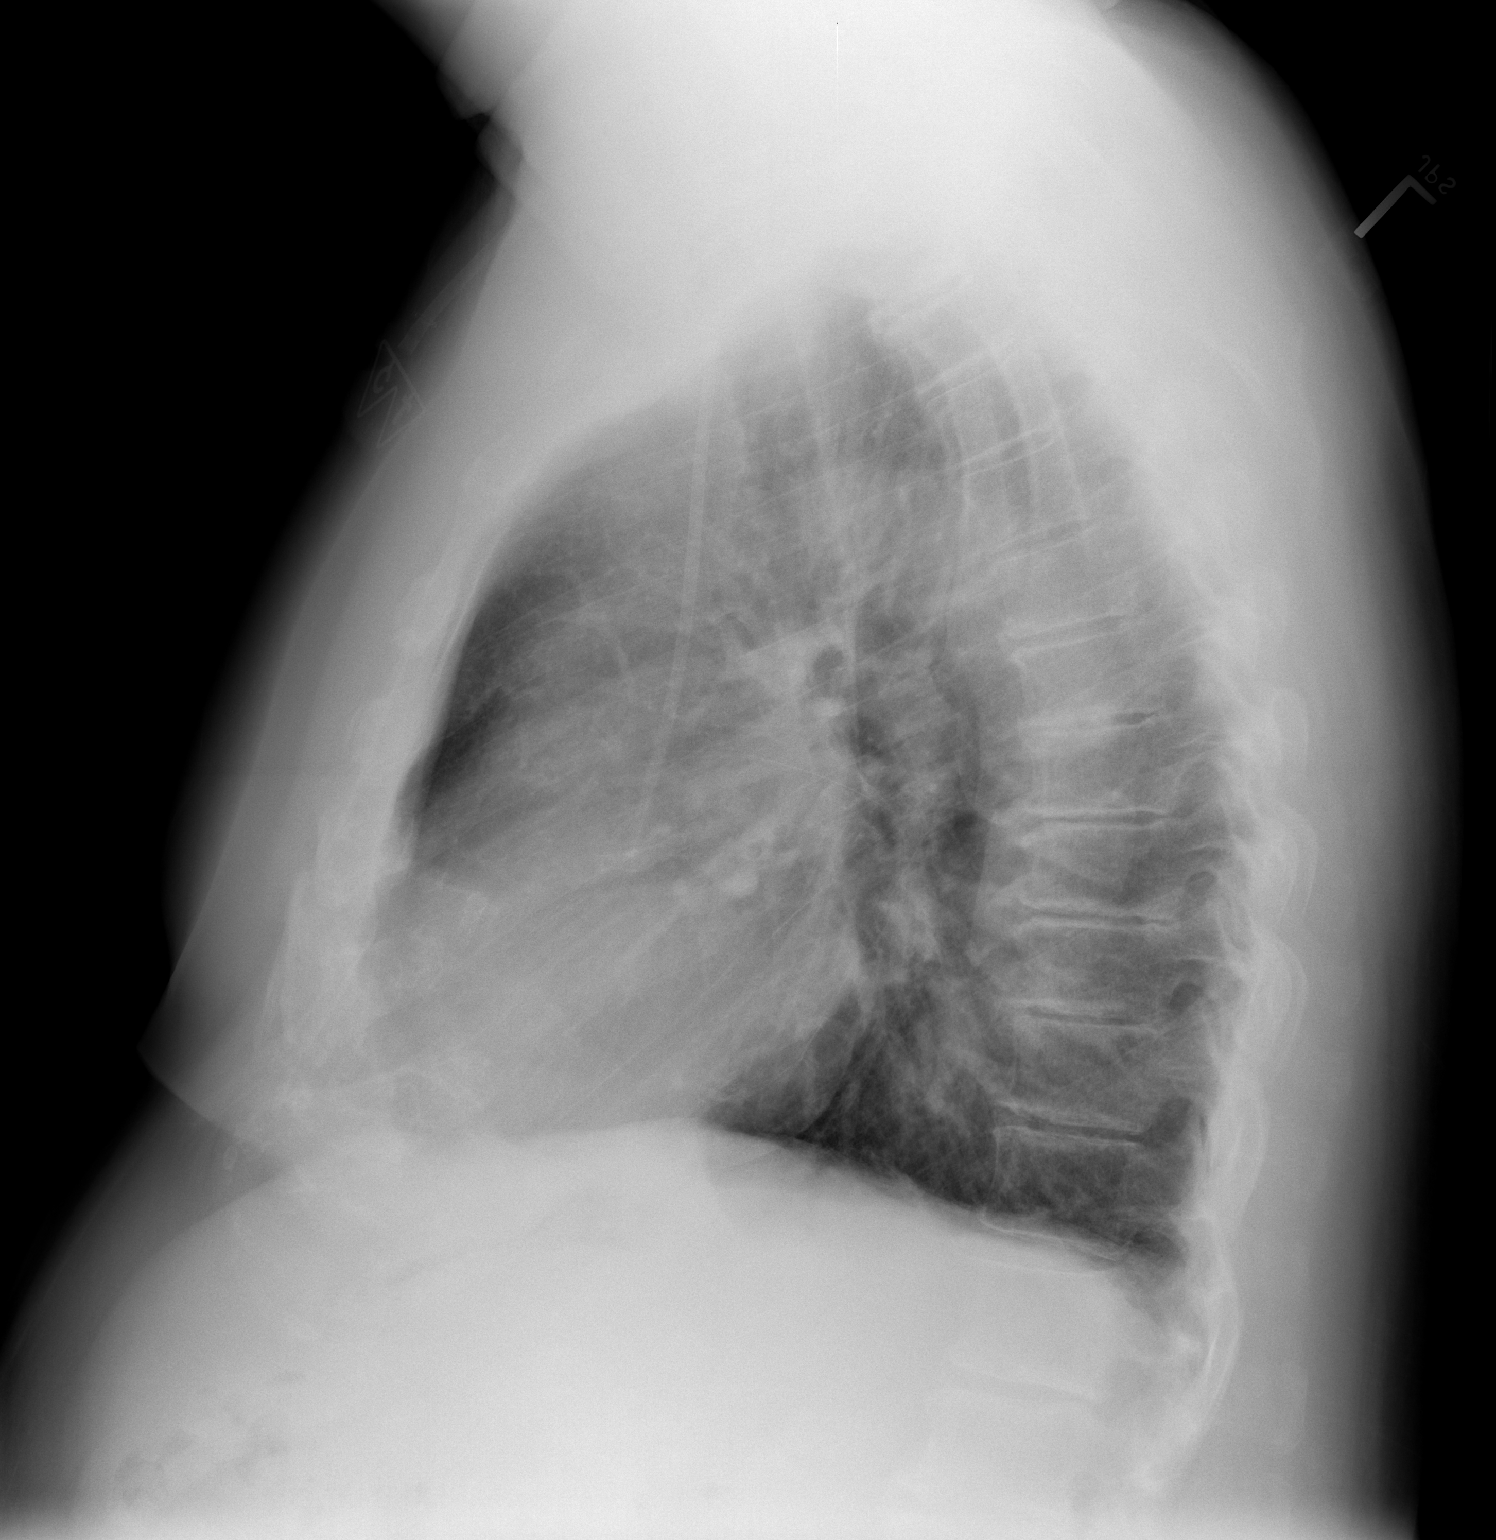

[2 of 2 positions shown; findings below may reference images not displayed]

FINDINGS: There is stable appearance of a dual-lumen Port-A-Cath with the
catheter tip in the lower SVC. Lungs show stable chronic disease and
bibasilar scarring without evidence of edema, infiltrate or nodule.
No pleural fluid is identified. The heart size is normal. The aorta
shows stable mild ectasia. The thoracic spine shows stable
spondylosis.
IMPRESSION: Stable chronic lung disease and bilateral pulmonary scarring.

## 2016-12-24 IMAGING — DX DG CHEST 1V PORT
1 series · 1 of 1 positions shown · non-contrast
Comparison: 09/09/2013

CLINICAL DATA: . Patient states that he had "Chinh Simonsen" when he laid
down today for a nap and was not easily aroused when by his
daughters who states that he was confused. Answering all questions
appropriate. Grip strength and leg strength equal bilaterally.
bladder ca, hx pna, cad, ex-smoker

EXAM:
PORTABLE CHEST - 1 VIEW

[chest ap]
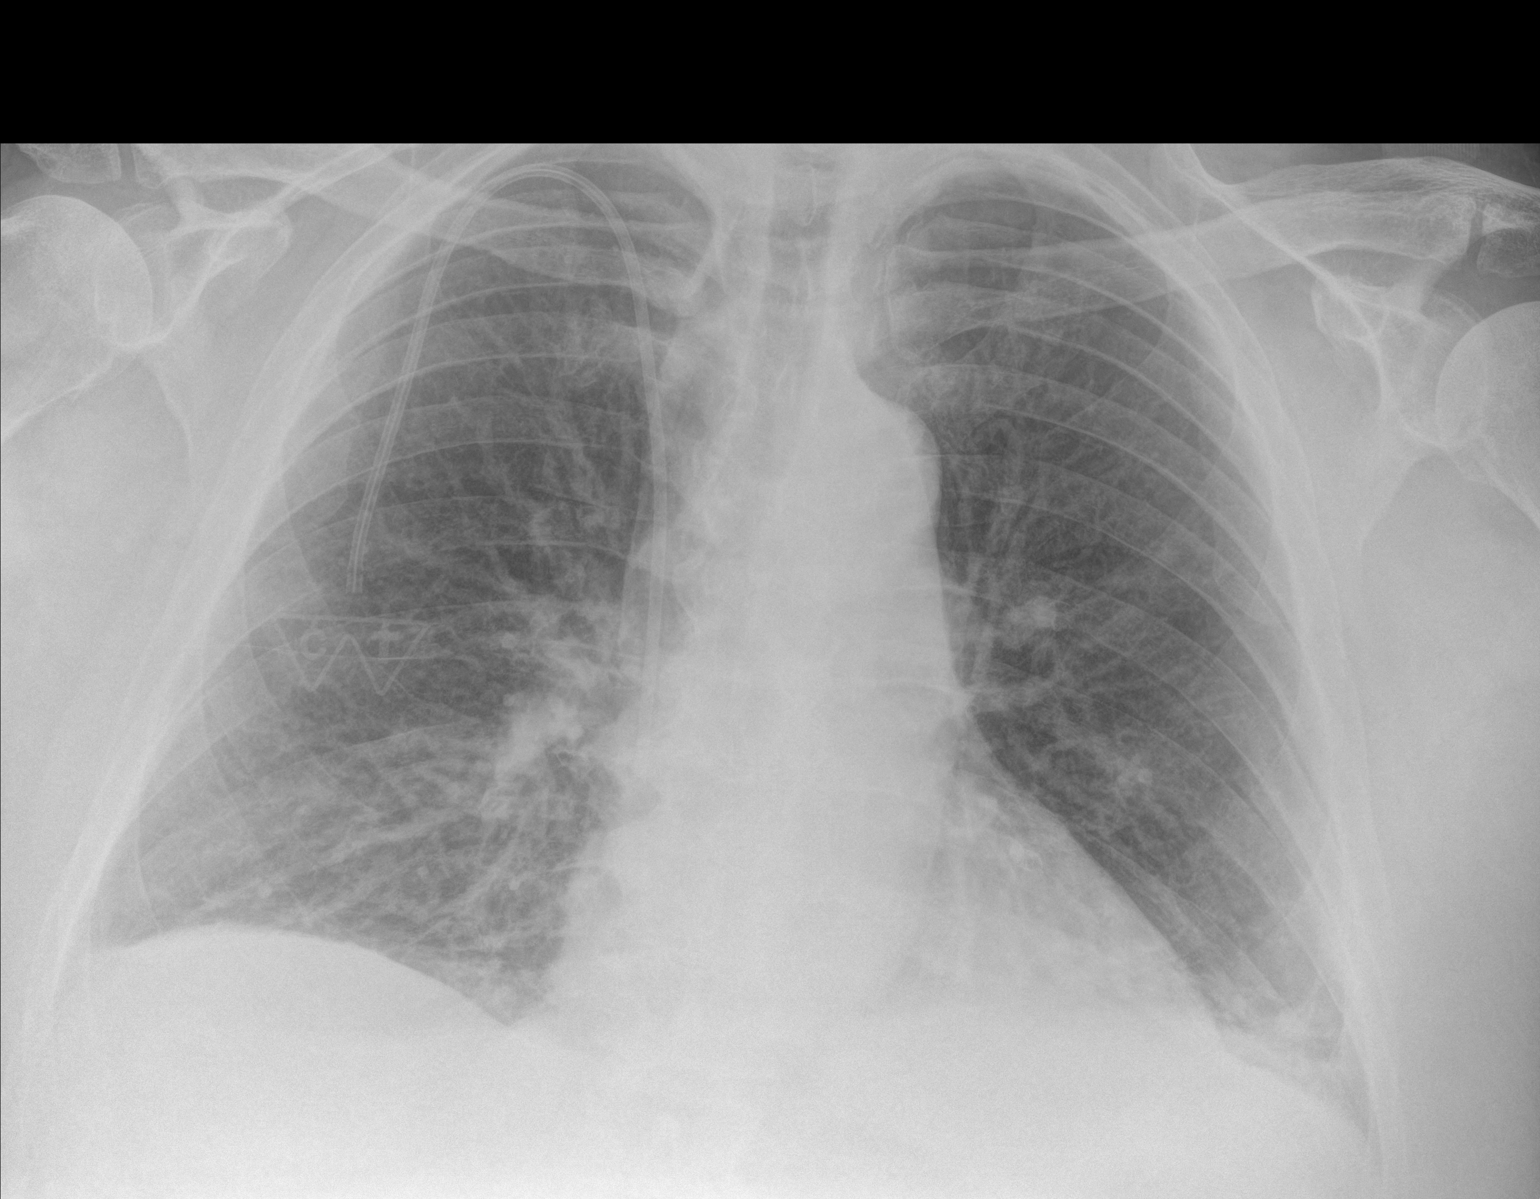

[1 of 1 positions shown; findings below may reference images not displayed]

FINDINGS: There is lung base patchy airspace and interstitial opacity more
evident on the left. This is similar to the prior study, may all
reflect chronic scarring and atelectasis. Pneumonia is possible.

No pulmonary edema.  No pleural effusion or pneumothorax.

Cardiac silhouette is normal size. No mediastinal or hilar masses.
Right anterior chest wall Port-A-Cath is stable with its tip just
above the caval atrial junction.

Bony thorax is demineralized but grossly intact.
IMPRESSION: Lung base opacity, left greater than right, similar to the prior
exam. This may all be chronic due to atelectasis/scarring. Pneumonia
is possible.

No other evidence of an acute abnormality.  No pulmonary edema.
